# Patient Record
Sex: Female | Born: 1965 | Race: White | Hispanic: No | Marital: Married | Smoking: Never smoker
Health system: Southern US, Community
[De-identification: ages and names within clinical notes are randomized; demographics above are authoritative.]

## PROBLEM LIST (undated history)

## (undated) DIAGNOSIS — T7840XA Allergy, unspecified, initial encounter: Secondary | ICD-10-CM

## (undated) DIAGNOSIS — R1011 Right upper quadrant pain: Secondary | ICD-10-CM

## (undated) DIAGNOSIS — H269 Unspecified cataract: Secondary | ICD-10-CM

## (undated) DIAGNOSIS — J45909 Unspecified asthma, uncomplicated: Secondary | ICD-10-CM

## (undated) DIAGNOSIS — J301 Allergic rhinitis due to pollen: Secondary | ICD-10-CM

## (undated) DIAGNOSIS — M5432 Sciatica, left side: Secondary | ICD-10-CM

## (undated) DIAGNOSIS — N6320 Unspecified lump in the left breast, unspecified quadrant: Secondary | ICD-10-CM

## (undated) DIAGNOSIS — B001 Herpesviral vesicular dermatitis: Secondary | ICD-10-CM

## (undated) DIAGNOSIS — E663 Overweight: Secondary | ICD-10-CM

## (undated) HISTORY — PX: ABDOMINAL HYSTERECTOMY: SHX81

## (undated) HISTORY — DX: Overweight: E66.3

## (undated) HISTORY — DX: Herpesviral vesicular dermatitis: B00.1

## (undated) HISTORY — DX: Right upper quadrant pain: R10.11

## (undated) HISTORY — DX: Allergy, unspecified, initial encounter: T78.40XA

## (undated) HISTORY — DX: Sciatica, left side: M54.32

## (undated) HISTORY — DX: Unspecified lump in the left breast, unspecified quadrant: N63.20

## (undated) HISTORY — DX: Unspecified asthma, uncomplicated: J45.909

## (undated) HISTORY — DX: Unspecified cataract: H26.9

## (undated) HISTORY — DX: Allergic rhinitis due to pollen: J30.1

---

## 1992-11-06 HISTORY — PX: OTHER SURGICAL HISTORY: SHX169

## 2005-11-06 HISTORY — PX: BLADDER SUSPENSION: SHX72

## 2011-12-09 ENCOUNTER — Emergency Department: Payer: Self-pay | Admitting: Emergency Medicine

## 2011-12-09 LAB — COMPREHENSIVE METABOLIC PANEL
Albumin: 3.9 g/dL (ref 3.4–5.0)
Alkaline Phosphatase: 51 U/L (ref 50–136)
Bilirubin,Total: 0.6 mg/dL (ref 0.2–1.0)
Co2: 25 mmol/L (ref 21–32)
Creatinine: 1 mg/dL (ref 0.60–1.30)
EGFR (African American): >60
EGFR (Non-African Amer.): >60
Glucose: 145 mg/dL — ABNORMAL HIGH (ref 65–99)
Osmolality: 280 (ref 275–301)
Sodium: 137 mmol/L (ref 136–145)

## 2011-12-09 LAB — TROPONIN I: Troponin-I: <0.02 ng/mL

## 2011-12-09 LAB — PROTIME-INR
INR: 1
Prothrombin Time: 13.6 secs (ref 11.5–14.7)

## 2012-09-12 ENCOUNTER — Ambulatory Visit: Payer: Self-pay | Admitting: Family Medicine

## 2012-11-06 LAB — HM MAMMOGRAPHY: HM Mammogram: NORMAL

## 2014-09-11 LAB — LIPID PANEL
CHOLESTEROL: 208 mg/dL — AB (ref 0–200)
HDL: 69 mg/dL (ref 35–70)
LDL Cholesterol: 122 mg/dL
Triglycerides: 85 mg/dL (ref 40–160)

## 2015-06-08 ENCOUNTER — Ambulatory Visit (INDEPENDENT_AMBULATORY_CARE_PROVIDER_SITE_OTHER): Payer: BC Managed Care – PPO | Admitting: Family Medicine

## 2015-06-08 ENCOUNTER — Ambulatory Visit: Payer: Self-pay | Admitting: Family Medicine

## 2015-06-08 ENCOUNTER — Encounter: Payer: Self-pay | Admitting: Family Medicine

## 2015-06-08 VITALS — BP 118/84 | HR 111 | Temp 99.0°F | Resp 20 | Ht 63.0 in | Wt 150.4 lb

## 2015-06-08 DIAGNOSIS — R300 Dysuria: Secondary | ICD-10-CM

## 2015-06-08 DIAGNOSIS — R350 Frequency of micturition: Secondary | ICD-10-CM | POA: Diagnosis not present

## 2015-06-08 DIAGNOSIS — N10 Acute tubulo-interstitial nephritis: Secondary | ICD-10-CM

## 2015-06-08 LAB — POCT URINALYSIS DIPSTICK
Bilirubin, UA: NEGATIVE
Glucose, UA: NEGATIVE
KETONES UA: NEGATIVE
Nitrite, UA: NEGATIVE
Protein, UA: 300
SPEC GRAV UA: 1.02
UROBILINOGEN UA: 0.2
pH, UA: 5

## 2015-06-08 MED ORDER — SULFAMETHOXAZOLE-TRIMETHOPRIM 800-160 MG PO TABS: 1.0000 | ORAL_TABLET | Freq: Two times a day (BID) | ORAL | Status: DC

## 2015-06-08 NOTE — Progress Notes (Signed)
Name: Cathy Gray   MRN: [id number]    DOB: 1966-10-17   Date:06/08/2015       Progress Note  Subjective  Chief Complaint  Chief Complaint  Patient presents with  . Urinary Tract Infection    Onset Friday, getting worst, frequency, urgency, burning when urinating and low back pain. Has tried otc Cranberry pills and drinking water with no relief.     Urinary Tract Infection  This is a new problem. The current episode started in the past 7 days. The problem occurs every urination. The problem has been gradually worsening. The quality of the pain is described as burning and stabbing. The pain is moderate. There has been no fever. She is sexually active. There is a history of pyelonephritis. Associated symptoms include chills, flank pain, frequency and urgency. Pertinent negatives include no hematuria. She has tried nothing for the symptoms. The treatment provided no relief. Her past medical history is significant for recurrent UTIs.      Past Medical History  Diagnosis Date  . Allergy   . Asthma     History  Substance Use Topics  . Smoking status: Not on file  . Smokeless tobacco: Never Used  . Alcohol Use: 0.0 oz/week    0 Standard drinks or equivalent per week     Comment: occasionally     Current outpatient prescriptions:  .  ADVAIR DISKUS 250-50 MCG/DOSE AEPB, Inhale 1 puff into the lungs every 12 (twelve) hours as needed., Disp: , Rfl:  .  Pediatric Multiple Vit-C-FA (PEDIATRIC MULTIVITAMIN) chewable tablet, Chew 1 tablet by mouth daily., Disp: , Rfl:   Allergies  Allergen Reactions  . Nitrofurantoin Anaphylaxis  . Penicillin V Potassium Other (See Comments)    Review of Systems  Constitutional: Positive for chills.  Gastrointestinal:       Flank pain  Genitourinary: Positive for dysuria, urgency, frequency and flank pain. Negative for hematuria.     Objective  Filed Vitals:   06/08/15 0912  BP: 118/84  Pulse: 111  Temp: 99 F (37.2 C)  TempSrc: Oral   Resp: 20  Height: 5\' 3"  (1.6 m)  Weight: 150 lb 6.4 oz (68.221 kg)  SpO2: 98%     Physical Exam  Constitutional: She is oriented to person, place, and time and well-developed, well-nourished, and in no distress.  HENT:  Head: Normocephalic.  Eyes: EOM are normal. Pupils are equal, round, and reactive to light.  Neck: Normal range of motion. No thyromegaly present.  Cardiovascular: Normal rate, regular rhythm and normal heart sounds.   No murmur heard. Pulmonary/Chest: Effort normal and breath sounds normal.  Abdominal: Soft. Bowel sounds are normal. There is tenderness.  Left flank pain to percussion  Musculoskeletal: Normal range of motion. She exhibits no edema.  Neurological: She is alert and oriented to person, place, and time. No cranial nerve deficit. Gait normal.  Skin: Skin is warm and dry. No rash noted.  Psychiatric: Memory and affect normal.      Assessment & Plan  1. Acute pyelonephritis Early. Would like to give parenteral abx , allergic to Pcn AVOID QUINOLONES PER CURRENT LITERATURE IF POSSIBLE TO ER IF WORSENED - sulfamethoxazole-trimethoprim (BACTRIM DS,SEPTRA DS) 800-160 MG per tablet; Take 1 tablet by mouth 2 (two) times daily.  Dispense: 17 tablet; Refill: 0  2. Urinary frequency  - POCT Urinalysis Dipstick - Urine Culture  3. Dysuria  - POCT Urinalysis Dipstick - Urine Culture

## 2015-06-08 NOTE — Patient Instructions (Signed)

## 2015-06-10 LAB — URINE CULTURE

## 2015-06-15 ENCOUNTER — Telehealth: Payer: Self-pay | Admitting: Emergency Medicine

## 2015-06-15 MED ORDER — CIPROFLOXACIN HCL 500 MG PO TABS: 500.0000 mg | ORAL_TABLET | Freq: Two times a day (BID) | ORAL | Status: DC

## 2015-06-15 NOTE — Telephone Encounter (Signed)
Spoke to patient about urine culture. Script called into [facility]

## 2015-06-29 ENCOUNTER — Ambulatory Visit: Payer: BC Managed Care – PPO | Admitting: Family Medicine

## 2015-07-28 ENCOUNTER — Telehealth: Payer: Self-pay | Admitting: Family Medicine

## 2015-07-28 NOTE — Telephone Encounter (Signed)
Pt would like to know if you could call her something in for UTI. [facility]. You have nothing available until 1st week of Oct.

## 2015-07-30 ENCOUNTER — Encounter: Payer: Self-pay | Admitting: Family Medicine

## 2015-07-30 ENCOUNTER — Ambulatory Visit (INDEPENDENT_AMBULATORY_CARE_PROVIDER_SITE_OTHER): Payer: BC Managed Care – PPO | Admitting: Family Medicine

## 2015-07-30 VITALS — BP 121/70 | HR 101 | Temp 98.7°F | Resp 14 | Ht 63.0 in | Wt 156.7 lb

## 2015-07-30 DIAGNOSIS — R300 Dysuria: Secondary | ICD-10-CM | POA: Diagnosis not present

## 2015-07-30 DIAGNOSIS — M5430 Sciatica, unspecified side: Secondary | ICD-10-CM | POA: Insufficient documentation

## 2015-07-30 DIAGNOSIS — N390 Urinary tract infection, site not specified: Secondary | ICD-10-CM | POA: Insufficient documentation

## 2015-07-30 DIAGNOSIS — Z23 Encounter for immunization: Secondary | ICD-10-CM

## 2015-07-30 DIAGNOSIS — J45909 Unspecified asthma, uncomplicated: Secondary | ICD-10-CM | POA: Insufficient documentation

## 2015-07-30 DIAGNOSIS — H269 Unspecified cataract: Secondary | ICD-10-CM | POA: Insufficient documentation

## 2015-07-30 LAB — POCT URINALYSIS DIPSTICK
Bilirubin, UA: NEGATIVE
Glucose, UA: NEGATIVE
Ketones, UA: NEGATIVE
NITRITE UA: NEGATIVE
PH UA: 5.5
Spec Grav, UA: 1.015
Urobilinogen, UA: 0.2

## 2015-07-30 MED ORDER — CIPROFLOXACIN HCL 250 MG PO TABS: 250.0000 mg | ORAL_TABLET | Freq: Two times a day (BID) | ORAL | Status: DC

## 2015-07-30 MED ORDER — PHENAZOPYRIDINE HCL 100 MG PO TABS: 100.0000 mg | ORAL_TABLET | Freq: Three times a day (TID) | ORAL | Status: DC | PRN

## 2015-07-30 NOTE — Progress Notes (Signed)
Name: Cathy Gray   MRN: [id number]    DOB: 12/02/1965   Date:07/30/2015       Progress Note  Subjective  Chief Complaint  Chief Complaint  Patient presents with  . Urinary Tract Infection    onset 3 days burning and pressure    HPI  Recurrent UTI / Dysuria : she states she has been having symptoms of UTI about twice monthly lately, usually improves with cranberry pills and juice. She states this episodes has been worse, with severe bladder spasms, urgency, urinary frequency, no hesitancy, no hematuria. No fever or back pain.  She has not taken antibiotics since seen by Dr. Rutherford Nail August 2nd and given Sulfa, never got rx of Cipro  She has a history of sling procedure.    Patient Active Problem List   Diagnosis Date Noted  . Asthma, moderate 07/30/2015  . Bilateral cataracts 07/30/2015  . Neuralgia neuritis, sciatic nerve 07/30/2015  . Recurrent UTI 07/30/2015    Past Surgical History  Procedure Laterality Date  . Abdominal hysterectomy    . Cesarean section    . Renal abscess  1994  . Bladder suspension  2007    Family History  Problem Relation Age of Onset  . Heart disease Father   . Asthma Father   . Diverticulosis Father   . Cancer Sister     Thyroid  . Anxiety disorder Daughter   . OCD Daughter   . Seizures Son     1  . Hyperlipidemia Maternal Grandmother   . Diverticulosis Maternal Grandmother   . Diabetes Maternal Grandfather   . Asthma Paternal Grandmother   . Hyperlipidemia Paternal Grandmother     Social History   Social History  . Marital Status: Married    Spouse Name: N/A  . Number of Children: N/A  . Years of Education: N/A   Occupational History  . Not on file.   Social History Main Topics  . Smoking status: Never Smoker   . Smokeless tobacco: Never Used  . Alcohol Use: No     Comment: occasionally  . Drug Use: No  . Sexual Activity: Yes   Other Topics Concern  . Not on file   Social History Narrative     Current outpatient  prescriptions:  .  ADVAIR DISKUS 250-50 MCG/DOSE AEPB, Inhale 1 puff into the lungs every 12 (twelve) hours as needed., Disp: , Rfl:  .  ciprofloxacin (CIPRO) 250 MG tablet, Take 1 tablet (250 mg total) by mouth 2 (two) times daily., Disp: 10 tablet, Rfl: 0 .  Pediatric Multivit-Minerals-C (GUMMI BEAR MULTIVITAMIN/MIN) CHEW, Chew 2 tablets by mouth daily., Disp: , Rfl:   Allergies  Allergen Reactions  . Nitrofurantoin Anaphylaxis  . Amoxicillin     rash  . Molds & Smuts   . Montelukast Sodium     tachycardia  . Nitrofurantoin Monohyd Macro Diarrhea, Itching and Nausea Only    Other reaction(s): Abdominal pain, Dizziness, Unconsciousness  . Other   . Penicillin V Potassium Other (See Comments)  . Pollen Extract      ROS  Constitutional: Negative for fever or weight change.  Respiratory: Negative for cough and shortness of breath.   Cardiovascular: Negative for chest pain or palpitations.  Gastrointestinal: positive for  abdominal pain, no bowel changes.  Musculoskeletal: Negative for gait problem or joint swelling.  Skin: Negative for rash.  Neurological: Negative for dizziness or headache.  No other specific complaints in a complete review of systems (except as listed in  HPI above).  Objective  Filed Vitals:   07/30/15 1319  BP: 121/70  Pulse: 101  Temp: 98.7 F (37.1 C)  TempSrc: Oral  Resp: 14  Height: 5\' 3"  (1.6 m)  Weight: 156 lb 11.2 oz (71.079 kg)  SpO2: 97%    Body mass index is 27.77 kg/(m^2).  Physical Exam  Constitutional: Patient appears well-developed and well-nourished. Obese  No distress.  HEENT: head atraumatic, normocephalic, pupils equal and reactive to light, neck supple, throat within normal limits Cardiovascular: Normal rate, regular rhythm and normal heart sounds.  No murmur heard. No BLE edema. Pulmonary/Chest: Effort normal and breath sounds normal. No respiratory distress. Abdominal: Soft.  Mild supra pubic discomfort.  Psychiatric:  Patient has a normal mood and affect. behavior is normal. Judgment and thought content normal.  Recent Results (from the past 2160 hour(s))  Urine Culture     Status: Abnormal   Collection Time: 06/08/15 12:00 AM  Result Value Ref Range   Urine Culture, Routine Final report (A)    Urine Culture result 1 Escherichia coli (A)     Comment: Greater than 100,000 colony forming units per mL   ANTIMICROBIAL SUSCEPTIBILITY Comment     Comment:       ** S = Susceptible; I = Intermediate; R = Resistant **                    P = Positive; N = Negative             MICS are expressed in micrograms per mL    Antibiotic                 RSLT#1    RSLT#2    RSLT#3    RSLT#4 Amoxicillin/Clavulanic Acid    I Ampicillin                     R Cefazolin                      R Cefepime                       S Ceftriaxone                    S Cefuroxime                     S Cephalothin                    R Ciprofloxacin                  S Ertapenem                      S Gentamicin                     S Imipenem                       S Levofloxacin                   S Nitrofurantoin                 R Piperacillin                   R Tetracycline  R Tobramycin                     S Trimethoprim/Sulfa             R   POCT Urinalysis Dipstick     Status: Abnormal   Collection Time: 06/08/15  9:22 AM  Result Value Ref Range   Color, UA dark yellow    Clarity, UA cloudy    Glucose, UA negative    Bilirubin, UA negative    Ketones, UA negative    Spec Grav, UA 1.020    Blood, UA moderate    pH, UA 5.0    Protein, UA 300    Urobilinogen, UA 0.2    Nitrite, UA negative    Leukocytes, UA moderate (2+) (A) Negative  POCT urinalysis dipstick     Status: Abnormal   Collection Time: 07/30/15  1:26 PM  Result Value Ref Range   Color, UA yell    Clarity, UA clear\    Glucose, UA neg    Bilirubin, UA neg    Ketones, UA neg    Spec Grav, UA 1.015    Blood, UA 2 trace    pH, UA 5.5     Protein, UA trace    Urobilinogen, UA 0.2    Nitrite, UA neg    Leukocytes, UA moderate (2+) (A) Negative     PHQ2/9: Depression screen [facility] 2/9 06/08/2015  Decreased Interest 0  Down, Depressed, Hopeless 0  PHQ - 2 Score 0     Fall Risk: Fall Risk  06/08/2015  Falls in the past year? No    Assessment & Plan  1. Burning with urination  We will also send Pyridium to take for next 24 hours for symptoms  - POCT urinalysis dipstick - Urine culture  2. Needs flu shot  - Flu Vaccine QUAD 36+ mos PF IM (Fluarix & Fluzone Quad PF)  3. Recurrent UTI  - Urine culture - Ambulatory referral to Urology - ciprofloxacin (CIPRO) 250 MG tablet; Take 1 tablet (250 mg total) by mouth 2 (two) times daily.  Dispense: 10 tablet; Refill: 0

## 2015-08-01 LAB — URINE CULTURE

## 2015-08-09 ENCOUNTER — Ambulatory Visit (INDEPENDENT_AMBULATORY_CARE_PROVIDER_SITE_OTHER): Payer: BC Managed Care – PPO | Admitting: Obstetrics and Gynecology

## 2015-08-09 ENCOUNTER — Encounter: Payer: Self-pay | Admitting: Obstetrics and Gynecology

## 2015-08-09 VITALS — BP 119/73 | HR 77 | Resp 16 | Ht 63.0 in | Wt 156.1 lb

## 2015-08-09 DIAGNOSIS — R109 Unspecified abdominal pain: Secondary | ICD-10-CM

## 2015-08-09 DIAGNOSIS — N390 Urinary tract infection, site not specified: Secondary | ICD-10-CM | POA: Diagnosis not present

## 2015-08-09 LAB — MICROSCOPIC EXAMINATION
RBC MICROSCOPIC, UA: NONE SEEN /HPF (ref 0–?)
WBC UA: NONE SEEN /HPF (ref 0–?)

## 2015-08-09 LAB — URINALYSIS, COMPLETE
Bilirubin, UA: NEGATIVE
Glucose, UA: NEGATIVE
Ketones, UA: NEGATIVE
Leukocytes, UA: NEGATIVE
NITRITE UA: NEGATIVE
PH UA: 7 (ref 5.0–7.5)
Protein, UA: NEGATIVE
RBC, UA: NEGATIVE
SPEC GRAV UA: 1.015 (ref 1.005–1.030)
Urobilinogen, Ur: 0.2 mg/dL (ref 0.2–1.0)

## 2015-08-09 LAB — BLADDER SCAN AMB NON-IMAGING

## 2015-08-09 NOTE — Progress Notes (Signed)
08/09/2015 9:24 AM   Clabe Seal [birth year] [id number]  Referring provider: Steele Sizer, MD [address] [place], [place] [address]  Chief Complaint  Patient presents with  . Recurrent UTI  . Establish Care    HPI: Patient is a 49 year old female presenting today for recurrent UTIs. Records indicate 2 positive urine cultures within the last 2 months positive for greater than 100,000 Escherichia coli. She most recently completed treatment with Cipro 2 days ago. She denies any urinary symptoms today. When she does have an infection symptoms include dysuria, pressure and urinary frequency. She has noticed some occasional right flank pain that radiates down her right leg. She does have a history of lower back problems. She also states that she has been noticing a vibrating sensation in her lower back and hips recently.  She is status post supracervical hysterectomy and bladder sling placement in 2009. She does report some perimenopausal symptoms including occasional night sweats. No complaints of vaginal dryness or irritation. No history of renal stones. No gross hematuria.  H2O intake 60oz of water daily.  No soda.   PMH: Past Medical History  Diagnosis Date  . Allergy   . Asthma   . Hay fever   . Bilateral cataracts   . Fever blister   . Colicky right upper quadrant pain   . Over weight   . Chronic sciatica of left side     Surgical History: Past Surgical History  Procedure Laterality Date  . Abdominal hysterectomy    . Cesarean section    . Renal abscess  1994  . Bladder suspension  2007    Home Medications:    Medication List       This list is accurate as of: 08/09/15  9:24 AM.  Always use your most recent med list.               ADVAIR DISKUS 250-50 MCG/DOSE Aepb  Generic drug:  Fluticasone-Salmeterol  Inhale 1 puff into the lungs every 12 (twelve) hours as needed.     GUMMI BEAR MULTIVITAMIN/MIN Chew  Chew 2 tablets by mouth daily.         Allergies:  Allergies  Allergen Reactions  . Nitrofurantoin Anaphylaxis  . Amoxicillin     rash  . Molds & Smuts   . Montelukast Sodium     tachycardia  . Nitrofurantoin Monohyd Macro Diarrhea, Itching and Nausea Only    Other reaction(s): Abdominal pain, Dizziness, Unconsciousness  . Other   . Penicillin V Potassium Other (See Comments)  . Pollen Extract     Family History: Family History  Problem Relation Age of Onset  . Heart disease Father   . Asthma Father   . Diverticulosis Father   . Cancer Sister     Thyroid  . Anxiety disorder Daughter   . OCD Daughter   . Seizures Son     1  . Hyperlipidemia Maternal Grandmother   . Diverticulosis Maternal Grandmother   . Diabetes Maternal Grandfather   . Asthma Paternal Grandmother   . Hyperlipidemia Paternal Grandmother     Social History:  reports that she has never smoked. She has never used smokeless tobacco. She reports that she does not drink alcohol or use illicit drugs.  ROS: UROLOGY Frequent Urination?: No Hard to postpone urination?: No Burning/pain with urination?: Yes Get up at night to urinate?: No Leakage of urine?: Yes Urine stream starts and stops?: No Trouble starting stream?: No Do you have to strain to  urinate?: No Blood in urine?: No Urinary tract infection?: Yes Sexually transmitted disease?: No Injury to kidneys or bladder?: No Painful intercourse?: No Weak stream?: No Currently pregnant?: No Vaginal bleeding?: No Last menstrual period?: n/a  Gastrointestinal Nausea?: No Vomiting?: No Indigestion/heartburn?: No Diarrhea?: No Constipation?: No  Constitutional Fever: No Night sweats?: Yes Weight loss?: No Fatigue?: No  Skin Skin rash/lesions?: No Itching?: No  Eyes Blurred vision?: No Double vision?: No  Ears/Nose/Throat Sore throat?: No Sinus problems?: No  Hematologic/Lymphatic Swollen glands?: No Easy bruising?: No  Cardiovascular Leg swelling?: No Chest  pain?: No  Respiratory Cough?: No Shortness of breath?: Yes  Endocrine Excessive thirst?: No  Musculoskeletal Back pain?: Yes Joint pain?: No  Neurological Headaches?: No Dizziness?: No  Psychologic Depression?: No Anxiety?: No  Physical Exam: BP 119/73 mmHg  Pulse 77  Resp 16  Ht 5\' 3"  (1.6 m)  Wt 156 lb 1.6 oz (70.806 kg)  BMI 27.66 kg/m2  Constitutional:  Alert and oriented, No acute distress. HEENT: [place] AT, moist mucus membranes.  Trachea midline, no masses. Cardiovascular: No clubbing, cyanosis, or edema. Respiratory: Normal respiratory effort, no increased work of breathing. GI: Abdomen is soft, mild suprapubic tenderness, nondistended, no abdominal masses GU: mild right CVA tenderness.  No vaginal lesions or discharge. Normal urethral meatus. No urethral discharge, masses or tenderness. Normal anus and perineum.   Vaginal mucosa pink, with good moisture and normal rugae.  No tenderness or inflammation. Vaginal vault with moderate support.  Grade 1 bladder prolapse with Valsalva Minimal urethral hypermobility without demonstrable SUI.   Cervix present and normal in appearance.  No discharge or motion tenderness. No adnexal tenderness or palpable masses.  Skin: No rashes, bruises or suspicious lesions. Lymph: No cervical or inguinal adenopathy. Neurologic: Grossly intact, no focal deficits, moving all 4 extremities. Psychiatric: Normal mood and affect.  Laboratory Data:  Urinalysis  Pertinent Imaging:   Assessment & Plan:  49 year old female with recurrent urinary tract infections.  1. Recurrent UTI- Records indicate 2 positive urine cultures within the last 2 months positive for greater than 100,000 Escherichia coli. She most recently completed treatment with Cipro 2 days ago. She denies any urinary symptoms today. When she does have an infection symptoms include dysuria, pressure and urinary frequency. UTI prevention strategies discussed.  Good perineal  hygiene reviewed. Patient is encouraged to increase daily water intake, start cranberry supplements to prevent invasive colonization along the urinary tract and probiotics, especially lactobacillus to restore normal vaginal flora. - Urinalysis, Complete - BLADDER SCAN AMB NON-IMAGING  2. Flank Pain-  Right flank pain with minimal right CVA tenderness today on exam. We'll obtain a renal ultrasound and KUB for evaluation of possible stones as nidus for recurrent urinary tract infections.  Return for f/u for Renal US and KUB results.  These notes generated with voice recognition software. I apologize for typographical errors.  Herbert Moors, [facility] [address], [place], [place] [address] [phone]

## 2015-08-16 ENCOUNTER — Telehealth: Payer: Self-pay

## 2015-08-16 LAB — CULTURE, URINE COMPREHENSIVE

## 2015-08-16 NOTE — Telephone Encounter (Signed)
LMOM for pt in reference to -ucx.

## 2015-08-16 NOTE — Telephone Encounter (Signed)
-----   Message from Roda Shutters, [place] sent at 08/16/2015  8:45 AM EDT ----- Please notify patient that her urine culture was negative for infection. Thank you

## 2015-08-20 ENCOUNTER — Ambulatory Visit
Admission: RE | Admit: 2015-08-20 | Discharge: 2015-08-20 | Disposition: A | Discharge status: Home / Self Care | Payer: BC Managed Care – PPO | Source: Ambulatory Visit | Attending: Obstetrics and Gynecology | Admitting: Obstetrics and Gynecology

## 2015-08-20 DIAGNOSIS — R109 Unspecified abdominal pain: Secondary | ICD-10-CM | POA: Diagnosis not present

## 2015-08-20 DIAGNOSIS — Z87440 Personal history of urinary (tract) infections: Secondary | ICD-10-CM | POA: Diagnosis not present

## 2015-08-20 DIAGNOSIS — N390 Urinary tract infection, site not specified: Secondary | ICD-10-CM

## 2015-08-26 ENCOUNTER — Encounter: Payer: Self-pay | Admitting: Obstetrics and Gynecology

## 2015-08-26 ENCOUNTER — Ambulatory Visit (INDEPENDENT_AMBULATORY_CARE_PROVIDER_SITE_OTHER): Payer: BC Managed Care – PPO | Admitting: Obstetrics and Gynecology

## 2015-08-26 VITALS — BP 129/81 | HR 76 | Resp 16 | Ht 63.0 in | Wt 158.4 lb

## 2015-08-26 DIAGNOSIS — R109 Unspecified abdominal pain: Secondary | ICD-10-CM | POA: Diagnosis not present

## 2015-08-26 DIAGNOSIS — N390 Urinary tract infection, site not specified: Secondary | ICD-10-CM

## 2015-08-26 LAB — MICROSCOPIC EXAMINATION: Renal Epithel, UA: NONE SEEN /hpf

## 2015-08-26 LAB — URINALYSIS, COMPLETE
Bilirubin, UA: NEGATIVE
Glucose, UA: NEGATIVE
KETONES UA: NEGATIVE
Leukocytes, UA: NEGATIVE
NITRITE UA: NEGATIVE
Protein, UA: NEGATIVE
Specific Gravity, UA: 1.015 (ref 1.005–1.030)
Urobilinogen, Ur: 0.2 mg/dL (ref 0.2–1.0)
pH, UA: 7.5 (ref 5.0–7.5)

## 2015-08-26 NOTE — Addendum Note (Signed)
Addended by: Roda Shutters on: 08/26/2015 09:33 AM   Modules accepted: Orders

## 2015-08-26 NOTE — Progress Notes (Signed)
08/26/2015 9:22 AM   Clabe Seal [birth year] [id number]  Referring provider: Steele Sizer, MD [address] [place], [place] [address]  No chief complaint on file.   HPI: Patient presents today for follow-up to review her renal ultrasound and KUB results.  Has history of recurrent urinary tract infections and flank pain imaging studies were performed to evaluate for possible renal stones as nidus for her recurrent infections and pain. Both renal ultrasound and KUB were negative for stones.  Previous Complaint History: Patient is a 49 year old female presenting today for recurrent UTIs. Records indicate 2 positive urine cultures within the last 2 months positive for greater than 100,000 Escherichia coli. She most recently completed treatment with Cipro 2 days ago. She denies any urinary symptoms today. When she does have an infection symptoms include dysuria, pressure and urinary frequency. She has noticed some occasional right flank pain that radiates down her right leg. She does have a history of lower back problems. She also states that she has been noticing a vibrating sensation in her lower back and hips recently.  She is status post supracervical hysterectomy and bladder sling placement in 2009. She does report some perimenopausal symptoms including occasional night sweats. No complaints of vaginal dryness or irritation. No history of renal stones. No gross hematuria.  H2O intake 60oz of water daily. No soda.    PMH: Past Medical History  Diagnosis Date  . Allergy   . Asthma   . Hay fever   . Bilateral cataracts   . Fever blister   . Colicky right upper quadrant pain   . Over weight   . Chronic sciatica of left side     Surgical History: Past Surgical History  Procedure Laterality Date  . Abdominal hysterectomy    . Cesarean section    . Renal abscess  1994  . Bladder suspension  2007    Home Medications:    Medication List       This list is  accurate as of: 08/26/15  9:22 AM.  Always use your most recent med list.               ADVAIR DISKUS 250-50 MCG/DOSE Aepb  Generic drug:  Fluticasone-Salmeterol  Inhale 1 puff into the lungs every 12 (twelve) hours as needed.     GUMMI BEAR MULTIVITAMIN/MIN Chew  Chew 2 tablets by mouth daily.        Allergies:  Allergies  Allergen Reactions  . Nitrofurantoin Anaphylaxis  . Amoxicillin     rash  . Molds & Smuts   . Montelukast Sodium     tachycardia  . Nitrofurantoin Monohyd Macro Diarrhea, Itching and Nausea Only    Other reaction(s): Abdominal pain, Dizziness, Unconsciousness  . Other   . Penicillin V Potassium Other (See Comments)  . Pollen Extract     Family History: Family History  Problem Relation Age of Onset  . Heart disease Father   . Asthma Father   . Diverticulosis Father   . Cancer Sister     Thyroid  . Anxiety disorder Daughter   . OCD Daughter   . Seizures Son     1  . Hyperlipidemia Maternal Grandmother   . Diverticulosis Maternal Grandmother   . Diabetes Maternal Grandfather   . Asthma Paternal Grandmother   . Hyperlipidemia Paternal Grandmother     Social History:  reports that she has never smoked. She has never used smokeless tobacco. She reports that she does not drink alcohol  or use illicit drugs.  ROS: UROLOGY Frequent Urination?: No Hard to postpone urination?: No Burning/pain with urination?: No Get up at night to urinate?: No Leakage of urine?: No Urine stream starts and stops?: No Trouble starting stream?: No Do you have to strain to urinate?: No Blood in urine?: No Urinary tract infection?: No Sexually transmitted disease?: No Injury to kidneys or bladder?: No Painful intercourse?: No Weak stream?: No Currently pregnant?: No Vaginal bleeding?: No Last menstrual period?: n  Gastrointestinal Nausea?: No Vomiting?: No Indigestion/heartburn?: No Diarrhea?: No Constipation?: No  Constitutional Fever: No Night  sweats?: No Weight loss?: No Fatigue?: No  Skin Skin rash/lesions?: No Itching?: No  Eyes Blurred vision?: No Double vision?: No  Ears/Nose/Throat Sore throat?: No Sinus problems?: No  Hematologic/Lymphatic Swollen glands?: No Easy bruising?: No  Cardiovascular Leg swelling?: No Chest pain?: No  Respiratory Cough?: No Shortness of breath?: No  Endocrine Excessive thirst?: No  Musculoskeletal Back pain?: No Joint pain?: No  Neurological Headaches?: No Dizziness?: No  Psychologic Depression?: No Anxiety?: No  Physical Exam: BP 129/81 mmHg  Pulse 76  Resp 16  Ht 5\' 3"  (1.6 m)  Wt 158 lb 6.4 oz (71.85 kg)  BMI 28.07 kg/m2  Constitutional:  Alert and oriented, No acute distress. HEENT: [place] AT, moist mucus membranes.  Trachea midline, no masses. Cardiovascular: No clubbing, cyanosis, or edema. Respiratory: Normal respiratory effort, no increased work of breathing. Skin: No rashes, bruises or suspicious lesions. Lymph: No cervical or inguinal adenopathy. Neurologic: Grossly intact, no focal deficits, moving all 4 extremities. Psychiatric: Normal mood and affect.  Laboratory Data: No results found for: WBC, HGB, HCT, MCV, PLT  Lab Results  Component Value Date   CREATININE 1.00 12/09/2011    No results found for: PSA  No results found for: TESTOSTERONE  No results found for: HGBA1C  Urinalysis    Component Value Date/Time   GLUCOSEU Negative 08/09/2015 0831   BILIRUBINUR Negative 08/09/2015 0831   BILIRUBINUR neg 07/30/2015 1326   PROTEINUR trace 07/30/2015 1326   UROBILINOGEN 0.2 07/30/2015 1326   NITRITE Negative 08/09/2015 0831   NITRITE neg 07/30/2015 1326   LEUKOCYTESUR Negative 08/09/2015 0831   LEUKOCYTESUR moderate (2+)* 07/30/2015 1326    Pertinent Imaging: CLINICAL DATA: Back pain. Recurrent UTIs.  EXAM: ABDOMEN - 1 VIEW  COMPARISON: None.  FINDINGS: Soft tissue structures are unremarkable. No bowel distention.  Stool noted throughout the colon. No pathologic intra-abdominal calcifications. No acute bony abnormality .  IMPRESSION: No acute abnormality .  CLINICAL DATA: Recurrent urinary tract infections, flank pain EXAM: RENAL / URINARY TRACT ULTRASOUND COMPLETE COMPARISON: 09/12/2012 FINDINGS: Right Kidney: Length: 10.9 cm. Echogenicity within normal limits. No mass or hydronephrosis visualized. Left Kidney:  Length: 12.3 cm. Echogenicity within normal limits. No mass or hydronephrosis visualized. Bladder: Appears normal for degree of bladder distention.  IMPRESSION: Normal renal ultrasound for age.  Assessment & Plan:    1. Recurrent UTIs- Records indicate 2 positive urine cultures within the last 2 months positive for greater than 100,000 Escherichia coli. She most recently completed treatment with Cipro 2 days ago. She denies any urinary symptoms today. When she does have an infection symptoms include dysuria, pressure and urinary frequency. UTI prevention strategies discussed. Good perineal hygiene reviewed. Patient is encouraged to increase daily water intake, start cranberry supplements to prevent invasive colonization along the urinary tract and probiotics, especially lactobacillus to restore normal vaginal flora. - Urinalysis, Complete - BLADDER SCAN AMB NON-IMAGING  2. Flank Pain- Right flank  pain.  KUB and RUS negative.  There are no diagnoses linked to this encounter.  Return in about 6 months (around 02/24/2016).  Herbert Moors, [facility] [address], [place], [place] [address] [phone]

## 2015-08-28 LAB — CULTURE, URINE COMPREHENSIVE

## 2015-08-30 ENCOUNTER — Telehealth: Payer: Self-pay

## 2015-08-30 NOTE — Telephone Encounter (Signed)
-----   Message from Roda Shutters, [place] sent at 08/30/2015 11:57 AM EDT ----- Please notify patient that her previous urinary tract infection has solved. Her urine culture was negative for infection. Thanks

## 2015-08-30 NOTE — Telephone Encounter (Signed)
Spoke with pt in reference to -ucx. Pt voiced understanding.  

## 2015-11-02 ENCOUNTER — Telehealth: Payer: Self-pay | Admitting: Family Medicine

## 2015-11-02 MED ORDER — CIPROFLOXACIN HCL 250 MG PO TABS: 250.0000 mg | ORAL_TABLET | Freq: Two times a day (BID) | ORAL | Status: DC

## 2015-11-02 NOTE — Telephone Encounter (Signed)
done

## 2015-11-02 NOTE — Telephone Encounter (Signed)
Patient stated that she has another bladder infection and would like something sent [facility] to the [facility]. Please call once complete

## 2015-11-03 NOTE — Telephone Encounter (Signed)
Patient notified

## 2015-12-07 ENCOUNTER — Other Ambulatory Visit: Payer: Self-pay | Admitting: Obstetrics and Gynecology

## 2015-12-07 DIAGNOSIS — N6315 Unspecified lump in the right breast, overlapping quadrants: Secondary | ICD-10-CM

## 2015-12-07 DIAGNOSIS — N6310 Unspecified lump in the right breast, unspecified quadrant: Principal | ICD-10-CM

## 2015-12-07 DIAGNOSIS — N6320 Unspecified lump in the left breast, unspecified quadrant: Secondary | ICD-10-CM

## 2015-12-07 HISTORY — DX: Unspecified lump in the left breast, unspecified quadrant: N63.20

## 2015-12-15 ENCOUNTER — Telehealth: Payer: Self-pay | Admitting: Family Medicine

## 2015-12-15 NOTE — Telephone Encounter (Signed)
Pt would like something called in for UTI at [facility].

## 2015-12-16 NOTE — Telephone Encounter (Signed)
Please see if patient can come in

## 2015-12-16 NOTE — Telephone Encounter (Signed)
She has pyelonephritis , she can call Urologist or come in to be seen now.

## 2015-12-17 NOTE — Telephone Encounter (Signed)
Pt was informed and was unable to come in.

## 2016-01-15 ENCOUNTER — Other Ambulatory Visit: Payer: Self-pay | Admitting: Family Medicine

## 2016-02-24 ENCOUNTER — Ambulatory Visit: Payer: BC Managed Care – PPO | Admitting: Urology

## 2016-04-18 ENCOUNTER — Other Ambulatory Visit: Payer: Self-pay | Admitting: Family Medicine

## 2016-04-18 NOTE — Telephone Encounter (Signed)
Patient requesting refill. 

## 2016-04-19 NOTE — Telephone Encounter (Signed)
Patient informed and appointment made for 05-24-16

## 2016-05-24 ENCOUNTER — Ambulatory Visit: Payer: BC Managed Care – PPO | Admitting: Family Medicine

## 2016-06-13 ENCOUNTER — Ambulatory Visit: Payer: BC Managed Care – PPO | Admitting: Family Medicine

## 2016-06-15 ENCOUNTER — Ambulatory Visit (INDEPENDENT_AMBULATORY_CARE_PROVIDER_SITE_OTHER): Payer: BC Managed Care – PPO | Admitting: Family Medicine

## 2016-06-15 ENCOUNTER — Encounter: Payer: Self-pay | Admitting: Family Medicine

## 2016-06-15 VITALS — BP 116/64 | HR 76 | Temp 98.3°F | Resp 18 | Ht 63.0 in | Wt 173.6 lb

## 2016-06-15 DIAGNOSIS — M5431 Sciatica, right side: Secondary | ICD-10-CM | POA: Diagnosis not present

## 2016-06-15 DIAGNOSIS — J45998 Other asthma: Secondary | ICD-10-CM | POA: Diagnosis not present

## 2016-06-15 DIAGNOSIS — J45909 Unspecified asthma, uncomplicated: Secondary | ICD-10-CM

## 2016-06-15 DIAGNOSIS — N390 Urinary tract infection, site not specified: Secondary | ICD-10-CM

## 2016-06-15 DIAGNOSIS — H6122 Impacted cerumen, left ear: Secondary | ICD-10-CM

## 2016-06-15 MED ORDER — CRANBERRY 300 MG PO TABS: 300.0000 mg | ORAL_TABLET | Freq: Two times a day (BID) | ORAL | 0 refills | Status: DC

## 2016-06-15 MED ORDER — ALBUTEROL SULFATE HFA 108 (90 BASE) MCG/ACT IN AERS: 2.0000 | INHALATION_SPRAY | Freq: Four times a day (QID) | RESPIRATORY_TRACT | 0 refills | Status: DC | PRN

## 2016-06-15 MED ORDER — FLUTICASONE FUROATE-VILANTEROL 100-25 MCG/INH IN AEPB: 1.0000 | INHALATION_SPRAY | Freq: Every day | RESPIRATORY_TRACT | 0 refills | Status: DC

## 2016-06-15 NOTE — Progress Notes (Signed)
Name: Cathy Gray   MRN: [id number]    DOB: 1965/12/29   Date:06/15/2016       Progress Note  Subjective  Chief Complaint  Chief Complaint  Patient presents with  . Medication Refill  . Asthma    Takes Advair daily and controls symptoms.     HPI  Asthma Moderate: she is using Advair but only once a day, she denies wheezing, cough or SOB. No nocturnal symptoms  Recurrent UTI: she has been taking otc Cranberry pills for almost one year, and only had mild UTI symptoms twice, no need for antibiotics. She has been drinking more water and is symptoms free at this time. No dysuria or urinary urgency or frequency  Sciatica: she has intermittent right lower back pain, worse with extension, using TENS unit and takes Ibuprofen prn a few times weekly, she has not tried massage therapy or chiropractor care. She had PT in the past and it improved symptoms.   Left otalgia: she states her left ear feels like there is something stuck in there, she has been trying to remove with Qtips, and paper clips. Advised to not put anything on ear canal. No fever, nasal congestion or drainage from ear  Patient Active Problem List   Diagnosis Date Noted  . Asthma, moderate 07/30/2015  . Bilateral cataracts 07/30/2015  . Neuralgia neuritis, sciatic nerve 07/30/2015  . Recurrent UTI 07/30/2015    Past Surgical History:  Procedure Laterality Date  . ABDOMINAL HYSTERECTOMY    . BLADDER SUSPENSION  2007  . CESAREAN SECTION    . renal abscess  1994    Family History  Problem Relation Age of Onset  . Heart disease Father   . Asthma Father   . Diverticulosis Father   . Cancer Sister     Thyroid  . Anxiety disorder Daughter   . OCD Daughter   . Seizures Son     1  . Hyperlipidemia Maternal Grandmother   . Diverticulosis Maternal Grandmother   . Diabetes Maternal Grandfather   . Asthma Paternal Grandmother   . Hyperlipidemia Paternal Grandmother     Social History   Social History  . Marital  status: Married    Spouse name: N/A  . Number of children: N/A  . Years of education: N/A   Occupational History  . Not on file.   Social History Main Topics  . Smoking status: Never Smoker  . Smokeless tobacco: Never Used  . Alcohol use No     Comment: occasionally  . Drug use: No  . Sexual activity: Yes    Partners: Male   Other Topics Concern  . Not on file   Social History Narrative  . No narrative on file     Current Outpatient Prescriptions:  .  Pediatric Multivit-Minerals-C (GUMMI BEAR MULTIVITAMIN/MIN) CHEW, Chew 2 tablets by mouth daily., Disp: , Rfl:  .  albuterol (PROVENTIL HFA;VENTOLIN HFA) 108 (90 Base) MCG/ACT inhaler, Inhale 2 puffs into the lungs every 6 (six) hours as needed for wheezing or shortness of breath., Disp: 1 Inhaler, Rfl: 0 .  Cranberry 300 MG tablet, Take 1 tablet (300 mg total) by mouth 2 (two) times daily., Disp: 60 tablet, Rfl: 0 .  fluticasone furoate-vilanterol (BREO ELLIPTA) 100-25 MCG/INH AEPB, Inhale 1 puff into the lungs daily., Disp: 60 each, Rfl: 0  Allergies  Allergen Reactions  . Nitrofurantoin Anaphylaxis  . Amoxicillin     rash  . Molds & Smuts   . Montelukast Sodium  tachycardia  . Nitrofurantoin Monohyd Macro Diarrhea, Itching and Nausea Only    Other reaction(s): Abdominal pain, Dizziness, Unconsciousness  . Penicillin V Potassium Other (See Comments)  . Pollen Extract      ROS  Constitutional: Negative for fever or weight change.  Respiratory: Negative for cough and shortness of breath.   Cardiovascular: Negative for chest pain or palpitations.  Gastrointestinal: Negative for abdominal pain, no bowel changes.  Musculoskeletal: Negative for gait problem or joint swelling.  Skin: Negative for rash.  Neurological: Negative for dizziness or headache.  No other specific complaints in a complete review of systems (except as listed in HPI above).  Objective  Vitals:   06/15/16 1151  BP: 116/64  Pulse: 76   Resp: 18  Temp: 98.3 F (36.8 C)  TempSrc: Oral  SpO2: 99%  Weight: 173 lb 9.6 oz (78.7 kg)  Height: 5\' 3"  (1.6 m)    Body mass index is 30.75 kg/m.  Physical Exam  Constitutional: Patient appears well-developed and well-nourished. Obese  No distress.  HEENT: head atraumatic, normocephalic, pupils equal and reactive to light, neck supple, throat within normal limits, left ear canal has small amount of wax, normal on right side Cardiovascular: Normal rate, regular rhythm and normal heart sounds.  No murmur heard. No BLE edema. Pulmonary/Chest: Effort normal and breath sounds normal. No respiratory distress. Abdominal: Soft.  There is no tenderness. Psychiatric: Patient has a normal mood and affect. behavior is normal. Judgment and thought content normal.   PHQ2/9: Depression screen [facility] 2/9 06/15/2016 06/08/2015  Decreased Interest 0 0  Down, Depressed, Hopeless 0 0  PHQ - 2 Score 0 0    Fall Risk: Fall Risk  06/15/2016 06/08/2015  Falls in the past year? No No    Functional Status Survey: Is the patient deaf or have difficulty hearing?: No Does the patient have difficulty seeing, even when wearing glasses/contacts?: No Does the patient have difficulty concentrating, remembering, or making decisions?: No Does the patient have difficulty walking or climbing stairs?: No Does the patient have difficulty dressing or bathing?: No Does the patient have difficulty doing errands alone such as visiting a doctor's office or shopping?: No    Assessment & Plan  1. Asthma, moderate  We will switch from Advair to [facility] to increase compliance, normal spirometry - fluticasone furoate-vilanterol (BREO ELLIPTA) 100-25 MCG/INH AEPB; Inhale 1 puff into the lungs daily.  Dispense: 60 each; Refill: 0 - albuterol (PROVENTIL HFA;VENTOLIN HFA) 108 (90 Base) MCG/ACT inhaler; Inhale 2 puffs into the lungs every 6 (six) hours as needed for wheezing or shortness of breath.  Dispense: 1 Inhaler; Refill:  0 - Spirometry: Peak  2. Recurrent UTI  - Cranberry 300 MG tablet; Take 1 tablet (300 mg total) by mouth 2 (two) times daily.  Dispense: 60 tablet; Refill: 0   3. Neuralgia neuritis, sciatic nerve, right   Advised to try seeing a chiropractor or resume PT   4. Excessive cerumen in ear canal, left  Offered ear lavage, but she prefers to hold off, advised peroxide and warm water ( half and half ) apply 3-4 drops left ear at night and let the was get outside the ear canal by itself.

## 2016-10-04 ENCOUNTER — Ambulatory Visit (INDEPENDENT_AMBULATORY_CARE_PROVIDER_SITE_OTHER): Payer: BC Managed Care – PPO | Admitting: Family Medicine

## 2016-10-04 ENCOUNTER — Encounter: Payer: Self-pay | Admitting: Family Medicine

## 2016-10-04 VITALS — BP 118/78 | HR 89 | Temp 98.7°F | Resp 16 | Ht 63.0 in | Wt 177.2 lb

## 2016-10-04 DIAGNOSIS — Z79899 Other long term (current) drug therapy: Secondary | ICD-10-CM

## 2016-10-04 DIAGNOSIS — G8929 Other chronic pain: Secondary | ICD-10-CM | POA: Diagnosis not present

## 2016-10-04 DIAGNOSIS — M5442 Lumbago with sciatica, left side: Secondary | ICD-10-CM

## 2016-10-04 DIAGNOSIS — Z13220 Encounter for screening for lipoid disorders: Secondary | ICD-10-CM | POA: Diagnosis not present

## 2016-10-04 DIAGNOSIS — Z131 Encounter for screening for diabetes mellitus: Secondary | ICD-10-CM

## 2016-10-04 DIAGNOSIS — M5441 Lumbago with sciatica, right side: Secondary | ICD-10-CM

## 2016-10-04 DIAGNOSIS — M5416 Radiculopathy, lumbar region: Secondary | ICD-10-CM

## 2016-10-04 DIAGNOSIS — Z23 Encounter for immunization: Secondary | ICD-10-CM

## 2016-10-04 LAB — CBC WITH DIFFERENTIAL/PLATELET
BASOS ABS: 61 {cells}/uL (ref 0–200)
BASOS PCT: 1 %
EOS ABS: 61 {cells}/uL (ref 15–500)
Eosinophils Relative: 1 %
HEMATOCRIT: 40.7 % (ref 35.0–45.0)
Hemoglobin: 13.7 g/dL (ref 11.7–15.5)
LYMPHS PCT: 28 %
Lymphs Abs: 1708 cells/uL (ref 850–3900)
MCH: 32.1 pg (ref 27.0–33.0)
MCHC: 33.7 g/dL (ref 32.0–36.0)
MCV: 95.3 fL (ref 80.0–100.0)
MONO ABS: 366 {cells}/uL (ref 200–950)
MONOS PCT: 6 %
MPV: 9.3 fL (ref 7.5–12.5)
NEUTROS PCT: 64 %
Neutro Abs: 3904 cells/uL (ref 1500–7800)
PLATELETS: 320 10*3/uL (ref 140–400)
RBC: 4.27 MIL/uL (ref 3.80–5.10)
RDW: 12.6 % (ref 11.0–15.0)
WBC: 6.1 10*3/uL (ref 3.8–10.8)

## 2016-10-04 LAB — COMPLETE METABOLIC PANEL WITH GFR
ALBUMIN: 4.2 g/dL (ref 3.6–5.1)
ALK PHOS: 48 U/L (ref 33–130)
ALT: 12 U/L (ref 6–29)
AST: 14 U/L (ref 10–35)
BUN: 14 mg/dL (ref 7–25)
CALCIUM: 9.5 mg/dL (ref 8.6–10.4)
CHLORIDE: 102 mmol/L (ref 98–110)
CO2: 28 mmol/L (ref 20–31)
Creat: 0.8 mg/dL (ref 0.50–1.05)
GFR, EST NON AFRICAN AMERICAN: 86 mL/min (ref >=60)
GFR, Est African American: >89 mL/min (ref >=60)
Glucose, Bld: 91 mg/dL (ref 65–99)
POTASSIUM: 4.7 mmol/L (ref 3.5–5.3)
SODIUM: 139 mmol/L (ref 135–146)
Total Bilirubin: 0.6 mg/dL (ref 0.2–1.2)
Total Protein: 6.8 g/dL (ref 6.1–8.1)

## 2016-10-04 LAB — LIPID PANEL
CHOL/HDL RATIO: 4 ratio (ref <5.0)
CHOLESTEROL: 249 mg/dL — AB (ref <200)
HDL: 63 mg/dL (ref >50)
LDL Cholesterol: 162 mg/dL — ABNORMAL HIGH (ref <100)
Triglycerides: 121 mg/dL (ref <150)
VLDL: 24 mg/dL (ref <30)

## 2016-10-04 LAB — HEMOGLOBIN A1C
HEMOGLOBIN A1C: 5 % (ref <5.7)
Mean Plasma Glucose: 97 mg/dL

## 2016-10-04 MED ORDER — PREGABALIN 75 MG PO CAPS
75.0000 mg | ORAL_CAPSULE | Freq: Two times a day (BID) | ORAL | 5 refills | Status: DC
Start: 2016-10-04 — End: 2017-05-10

## 2016-10-04 NOTE — Progress Notes (Signed)
Name: Cathy Gray   MRN: [id number]    DOB: 03-Feb-1966   Date:10/04/2016       Progress Note  Subjective  Chief Complaint  Chief Complaint  Patient presents with  . Foot Pain    pt thinks it may be a pinched nerve left leg tingles     HPI  Chronic low back pain with radiculitis: patient has a long history of low back pain, but states she had tingling down left buttocks and left toes a couple of weeks ago, it lasted a few days and resolved by itself. She has seen neurosurgeon in the past and could not tolerate gabapentin, we discussed trying Lyrica and she agrees. Discussed possible side effects. No bowel or bladder incontinence. No recent injuries no lower extremity edema  Patient Active Problem List   Diagnosis Date Noted  . Asthma, moderate 07/30/2015  . Bilateral cataracts 07/30/2015  . Neuralgia neuritis, sciatic nerve 07/30/2015  . Recurrent UTI 07/30/2015    Past Surgical History:  Procedure Laterality Date  . ABDOMINAL HYSTERECTOMY    . BLADDER SUSPENSION  2007  . CESAREAN SECTION    . renal abscess  1994    Family History  Problem Relation Age of Onset  . Heart disease Father   . Asthma Father   . Diverticulosis Father   . Cancer Sister     Thyroid  . Anxiety disorder Daughter   . OCD Daughter   . Seizures Son     1  . Hyperlipidemia Maternal Grandmother   . Diverticulosis Maternal Grandmother   . Diabetes Maternal Grandfather   . Asthma Paternal Grandmother   . Hyperlipidemia Paternal Grandmother     Social History   Social History  . Marital status: Married    Spouse name: N/A  . Number of children: N/A  . Years of education: N/A   Occupational History  . Not on file.   Social History Main Topics  . Smoking status: Never Smoker  . Smokeless tobacco: Never Used  . Alcohol use No     Comment: occasionally  . Drug use: No  . Sexual activity: Yes    Partners: Male   Other Topics Concern  . Not on file   Social History Narrative  . No  narrative on file     Current Outpatient Prescriptions:  .  albuterol (PROVENTIL HFA;VENTOLIN HFA) 108 (90 Base) MCG/ACT inhaler, Inhale 2 puffs into the lungs every 6 (six) hours as needed for wheezing or shortness of breath., Disp: 1 Inhaler, Rfl: 0 .  Cranberry 300 MG tablet, Take 1 tablet (300 mg total) by mouth 2 (two) times daily., Disp: 60 tablet, Rfl: 0 .  Pediatric Multivit-Minerals-C (GUMMI BEAR MULTIVITAMIN/MIN) CHEW, Chew 2 tablets by mouth daily., Disp: , Rfl:  .  fluticasone furoate-vilanterol (BREO ELLIPTA) 100-25 MCG/INH AEPB, Inhale 1 puff into the lungs daily. (Patient not taking: Reported on 10/04/2016), Disp: 60 each, Rfl: 0 .  pregabalin (LYRICA) 75 MG capsule, Take 1 capsule (75 mg total) by mouth 2 (two) times daily., Disp: 60 capsule, Rfl: 5  Allergies  Allergen Reactions  . Nitrofurantoin Anaphylaxis  . Amoxicillin     rash  . Gabapentin   . Molds & Smuts   . Montelukast Sodium     tachycardia  . Nitrofurantoin Monohyd Macro Diarrhea, Itching and Nausea Only    Other reaction(s): Abdominal pain, Dizziness, Unconsciousness  . Penicillin V Potassium Other (See Comments)  . Pollen Extract      ROS  Ten systems reviewed and is negative except as mentioned in HPI   Objective  Vitals:   10/04/16 0947  BP: 118/78  Pulse: 89  Resp: 16  Temp: 98.7 F (37.1 C)  TempSrc: Oral  SpO2: 98%  Weight: 177 lb 3 oz (80.4 kg)  Height: 5\' 3"  (1.6 m)    Body mass index is 31.39 kg/m.  Physical Exam  Constitutional: Patient appears well-developed and well-nourished. Obese  No distress.  HEENT: head atraumatic, normocephalic, pupils equal and reactive to light,  neck supple, throat within normal limits Cardiovascular: Normal rate, regular rhythm and normal heart sounds.  No murmur heard. No BLE edema. Pulmonary/Chest: Effort normal and breath sounds normal. No respiratory distress. Abdominal: Soft.  There is no tenderness. Psychiatric: Patient has a normal  mood and affect. behavior is normal. Judgment and thought content normal. Muscular Skeletal: no pain during palpation of lumbar spine, pain with right lateral bending, negative straight leg raise  PHQ2/9: Depression screen [facility] 2/9 10/04/2016 06/15/2016 06/08/2015  Decreased Interest 0 0 0  Down, Depressed, Hopeless 0 0 0  PHQ - 2 Score 0 0 0     Fall Risk: Fall Risk  10/04/2016 06/15/2016 06/08/2015  Falls in the past year? No No No     Functional Status Survey: Is the patient deaf or have difficulty hearing?: No Does the patient have difficulty seeing, even when wearing glasses/contacts?: No Does the patient have difficulty concentrating, remembering, or making decisions?: No Does the patient have difficulty walking or climbing stairs?: No Does the patient have difficulty dressing or bathing?: No Does the patient have difficulty doing errands alone such as visiting a doctor's office or shopping?: No    Assessment & Plan  1. Left lumbar radiculitis  - pregabalin (LYRICA) 75 MG capsule; Take 1 capsule (75 mg total) by mouth 2 (two) times daily.  Dispense: 60 capsule; Refill: 5  2. Needs flu shot  - Flu Vaccine QUAD 36+ mos IM  3. Chronic right-sided low back pain with bilateral sciatica   4. Lipid screening  - Lipid panel  5. Diabetes mellitus screening  - Hemoglobin A1c  6. Long-term use of high-risk medication  - COMPLETE METABOLIC PANEL WITH GFR - CBC with Differential/Platelet  7. Need for pneumococcal vaccination  - Pneumococcal polysaccharide vaccine 23-valent greater than or equal to 2yo subcutaneous/IM

## 2016-10-17 ENCOUNTER — Telehealth: Payer: Self-pay

## 2016-10-17 NOTE — Telephone Encounter (Signed)
Please review labs and send to me so I can inform patient of her labwork.

## 2016-10-17 NOTE — Telephone Encounter (Signed)
It is at the bottom of her labs. It was annotate but sent to patient.

## 2016-11-17 ENCOUNTER — Telehealth: Payer: Self-pay

## 2016-11-17 ENCOUNTER — Ambulatory Visit (INDEPENDENT_AMBULATORY_CARE_PROVIDER_SITE_OTHER): Payer: BC Managed Care – PPO | Admitting: Family Medicine

## 2016-11-17 ENCOUNTER — Encounter: Payer: Self-pay | Admitting: Family Medicine

## 2016-11-17 VITALS — BP 112/70 | HR 106 | Temp 99.0°F | Resp 16 | Ht 63.0 in | Wt 173.5 lb

## 2016-11-17 DIAGNOSIS — J069 Acute upper respiratory infection, unspecified: Secondary | ICD-10-CM | POA: Diagnosis not present

## 2016-11-17 DIAGNOSIS — B9789 Other viral agents as the cause of diseases classified elsewhere: Secondary | ICD-10-CM

## 2016-11-17 DIAGNOSIS — J029 Acute pharyngitis, unspecified: Secondary | ICD-10-CM

## 2016-11-17 LAB — POCT RAPID STREP A (OFFICE): Rapid Strep A Screen: NEGATIVE

## 2016-11-17 MED ORDER — BENZONATATE 200 MG PO CAPS: 200.0000 mg | ORAL_CAPSULE | Freq: Three times a day (TID) | ORAL | 0 refills | Status: DC | PRN

## 2016-11-17 NOTE — Progress Notes (Signed)
Name: Cathy Gray   MRN: [id number]    DOB: Nov 30, 1965   Date:11/17/2016       Progress Note  Subjective  Chief Complaint  Chief Complaint  Patient presents with  . Sore Throat    headache for 1 day    Sore Throat   This is a new problem. The current episode started yesterday. There has been no fever. Associated symptoms include congestion, coughing, headaches and shortness of breath (history of asthma and wheezing started this AM). Pertinent negatives include no ear discharge. She has had no exposure to strep. Exposure to: works in [employer] and comes into daily contact with many children. Treatments tried: Dayquil cold and fever.    Past Medical History:  Diagnosis Date  . Allergy   . Asthma   . Bilateral cataracts   . Breast mass, left 12/07/2015  . Chronic sciatica of left side   . Colicky right upper quadrant pain   . Fever blister   . Hay fever   . Over weight     Past Surgical History:  Procedure Laterality Date  . ABDOMINAL HYSTERECTOMY    . BLADDER SUSPENSION  2007  . CESAREAN SECTION    . renal abscess  1994    Family History  Problem Relation Age of Onset  . Heart disease Father   . Asthma Father   . Diverticulosis Father   . Cancer Sister     Thyroid  . Anxiety disorder Daughter   . OCD Daughter   . Seizures Son     1  . Hyperlipidemia Maternal Grandmother   . Diverticulosis Maternal Grandmother   . Diabetes Maternal Grandfather   . Asthma Paternal Grandmother   . Hyperlipidemia Paternal Grandmother     Social History   Social History  . Marital status: Married    Spouse name: N/A  . Number of children: N/A  . Years of education: N/A   Occupational History  . Not on file.   Social History Main Topics  . Smoking status: Never Smoker  . Smokeless tobacco: Never Used  . Alcohol use No     Comment: occasionally  . Drug use: No  . Sexual activity: Yes    Partners: Male   Other Topics Concern  . Not on file   Social History  Narrative  . No narrative on file     Current Outpatient Prescriptions:  .  albuterol (PROVENTIL HFA;VENTOLIN HFA) 108 (90 Base) MCG/ACT inhaler, Inhale 2 puffs into the lungs every 6 (six) hours as needed for wheezing or shortness of breath., Disp: 1 Inhaler, Rfl: 0 .  Cranberry 300 MG tablet, Take 1 tablet (300 mg total) by mouth 2 (two) times daily., Disp: 60 tablet, Rfl: 0 .  fluticasone furoate-vilanterol (BREO ELLIPTA) 100-25 MCG/INH AEPB, Inhale 1 puff into the lungs daily., Disp: 60 each, Rfl: 0 .  Pediatric Multivit-Minerals-C (GUMMI BEAR MULTIVITAMIN/MIN) CHEW, Chew 2 tablets by mouth daily., Disp: , Rfl:  .  pregabalin (LYRICA) 75 MG capsule, Take 1 capsule (75 mg total) by mouth 2 (two) times daily., Disp: 60 capsule, Rfl: 5 .  ADVAIR DISKUS 250-50 MCG/DOSE AEPB, , Disp: , Rfl:   Allergies  Allergen Reactions  . Nitrofurantoin Anaphylaxis  . Amoxicillin     rash  . Gabapentin   . Molds & Smuts   . Montelukast Sodium     tachycardia  . Nitrofurantoin Monohyd Macro Diarrhea, Itching and Nausea Only    Other reaction(s): Abdominal pain, Dizziness,  Unconsciousness  . Penicillin V Potassium Other (See Comments)  . Pollen Extract      Review of Systems  HENT: Positive for congestion. Negative for ear discharge.   Respiratory: Positive for cough and shortness of breath (history of asthma and wheezing started this AM).   Neurological: Positive for headaches.     Objective  Vitals:   11/17/16 0944  BP: 112/70  Pulse: (!) 106  Resp: 16  Temp: 99 F (37.2 C)  TempSrc: Oral  SpO2: 96%  Weight: 173 lb 8 oz (78.7 kg)  Height: 5\' 3"  (1.6 m)    Physical Exam  Constitutional: She is oriented to person, place, and time and well-developed, well-nourished, and in no distress.  HENT:  Mouth/Throat: No oropharyngeal exudate, posterior oropharyngeal edema or posterior oropharyngeal erythema.  Cerumen impaction left ear  Cardiovascular: Normal rate, regular rhythm and  normal heart sounds.   No murmur heard. Pulmonary/Chest: Effort normal and breath sounds normal. She has no wheezes. She has no rhonchi.  Neurological: She is alert and oriented to person, place, and time.  Psychiatric: Mood, memory, affect and judgment normal.  Nursing note and vitals reviewed.      Assessment & Plan  1. Sore throat Rapid strep test is negative - POCT rapid strep A  2. Viral URI with cough Likely viral upper respiratory infection, no fever, no myalgias solely low likelihood of influenza. Advised to use Tylenol for sore throat and call if not improving within 72 hours. Patient verbalized understanding - benzonatate (TESSALON) 200 MG capsule; Take 1 capsule (200 mg total) by mouth 3 (three) times daily as needed for cough.  Dispense: 20 capsule; Refill: 0   [name] 11/17/2016 9:55 AM

## 2016-11-17 NOTE — Telephone Encounter (Signed)
Patient called states has a really bad sore throat and we don't have any appts today and [facility] walk in is a 2hr wait.  Wants to see if you can call something in?  I told her we usually don't give anything for sore throat but I would ask.  I told her to do salt water gargles, throat lozegens and even could get throat chloraseptic spray.

## 2016-11-17 NOTE — Telephone Encounter (Signed)
Please disregard, there was a cancellation with shah and pt was seen today.

## 2016-11-20 ENCOUNTER — Telehealth: Payer: Self-pay

## 2016-11-20 DIAGNOSIS — J069 Acute upper respiratory infection, unspecified: Secondary | ICD-10-CM

## 2016-11-20 NOTE — Telephone Encounter (Signed)
Patient called stating she seen Dr. Manuella Ghazi last week and was told if she was not feeling any better to call back. Patient was prescribed Benzonatate by Dr. Manuella Ghazi and taking NyQuil Daytime 1 capsule every 4 hours and gargling with salt water. Onset Thursday 11/16/2016, symptoms sore throat, tinnitus, scratchy throat and body aches. Patient was told if her symptoms were any better to call us back and she states they are worst. Please advise.  [phone]

## 2016-11-20 NOTE — Telephone Encounter (Signed)
Patient called stating she seen Dr. Manuella Ghazi last week and was told if she was not feeling any better to call back. Patient was prescribed Benzonatate by Dr. Manuella Ghazi and taking NyQuil Daytime 1 capsule every 4 hours and gargling with salt water. Onset Thursday 11/16/2016, symptoms sore throat, tinnitus, scratchy throat and body aches. Patient was told if her symptoms were any better to call us back and she states they are worst. Please advise.  [phone]

## 2016-11-22 MED ORDER — AZITHROMYCIN 250 MG PO TABS: ORAL_TABLET | ORAL | 0 refills | Status: DC

## 2016-11-22 NOTE — Telephone Encounter (Signed)
Contacted patient and started on Azithromycin, prescription sent to pharmacy

## 2016-11-22 NOTE — Addendum Note (Signed)
Addended byManuella Ghazi, Tameya Kuznia A A on: 11/22/2016 09:52 AM   Modules accepted: Orders

## 2016-12-08 ENCOUNTER — Encounter: Payer: BC Managed Care – PPO | Admitting: Family Medicine

## 2016-12-18 ENCOUNTER — Ambulatory Visit: Payer: BC Managed Care – PPO | Admitting: Family Medicine

## 2016-12-20 ENCOUNTER — Encounter: Payer: BC Managed Care – PPO | Admitting: Family Medicine

## 2017-01-01 LAB — HM MAMMOGRAPHY: HM Mammogram: NORMAL (ref 0–4)

## 2017-03-10 ENCOUNTER — Other Ambulatory Visit: Payer: Self-pay | Admitting: Family Medicine

## 2017-03-12 ENCOUNTER — Other Ambulatory Visit: Payer: Self-pay

## 2017-03-12 ENCOUNTER — Telehealth: Payer: Self-pay | Admitting: Family Medicine

## 2017-03-12 ENCOUNTER — Other Ambulatory Visit: Payer: Self-pay | Admitting: Family Medicine

## 2017-03-12 DIAGNOSIS — J4541 Moderate persistent asthma with (acute) exacerbation: Secondary | ICD-10-CM

## 2017-03-12 MED ORDER — ALBUTEROL SULFATE HFA 108 (90 BASE) MCG/ACT IN AERS: 2.0000 | INHALATION_SPRAY | Freq: Four times a day (QID) | RESPIRATORY_TRACT | 0 refills | Status: DC | PRN

## 2017-03-12 NOTE — Telephone Encounter (Signed)
Patient requesting refill of Advair to [facility].

## 2017-03-12 NOTE — Telephone Encounter (Signed)
Patient requesting refill of Proair to [facility].

## 2017-03-12 NOTE — Telephone Encounter (Signed)
errenous °

## 2017-03-12 NOTE — Telephone Encounter (Signed)
Pt states she is taking advair 250-50 and albuterol

## 2017-05-10 ENCOUNTER — Ambulatory Visit (INDEPENDENT_AMBULATORY_CARE_PROVIDER_SITE_OTHER): Payer: BC Managed Care – PPO | Admitting: Family Medicine

## 2017-05-10 ENCOUNTER — Encounter: Payer: Self-pay | Admitting: Family Medicine

## 2017-05-10 VITALS — BP 110/68 | HR 89 | Temp 98.2°F | Resp 18 | Ht 63.0 in | Wt 170.6 lb

## 2017-05-10 DIAGNOSIS — J4540 Moderate persistent asthma, uncomplicated: Secondary | ICD-10-CM

## 2017-05-10 DIAGNOSIS — E669 Obesity, unspecified: Secondary | ICD-10-CM | POA: Diagnosis not present

## 2017-05-10 DIAGNOSIS — N390 Urinary tract infection, site not specified: Secondary | ICD-10-CM | POA: Diagnosis not present

## 2017-05-10 DIAGNOSIS — M545 Low back pain, unspecified: Secondary | ICD-10-CM

## 2017-05-10 MED ORDER — CYCLOBENZAPRINE HCL 5 MG PO TABS: 5.0000 mg | ORAL_TABLET | Freq: Three times a day (TID) | ORAL | 0 refills | Status: DC | PRN

## 2017-05-10 MED ORDER — FLUTICASONE-SALMETEROL 250-50 MCG/DOSE IN AEPB: 1.0000 | INHALATION_SPRAY | Freq: Two times a day (BID) | RESPIRATORY_TRACT | 5 refills | Status: DC

## 2017-05-10 MED ORDER — MELOXICAM 15 MG PO TABS: 15.0000 mg | ORAL_TABLET | Freq: Every day | ORAL | 2 refills | Status: DC

## 2017-05-10 NOTE — Patient Instructions (Signed)
Low Back Sprain Rehab  Ask your health care provider which exercises are safe for you. Do exercises exactly as told by your health care provider and adjust them as directed. It is normal to feel mild stretching, pulling, tightness, or discomfort as you do these exercises, but you should stop right away if you feel sudden pain or your pain gets worse. Do not begin these exercises until told by your health care provider.  Stretching and range of motion exercises  These exercises warm up your muscles and joints and improve the movement and flexibility of your back. These exercises also help to relieve pain, numbness, and tingling.  Exercise A: Lumbar rotation    1. Lie on your back on a firm surface and bend your knees.  2. Straighten your arms out to your sides so each arm forms an "L" shape with a side of your body (a 90 degree angle).  3. Slowly move both of your knees to one side of your body until you feel a stretch in your lower back. Try not to let your shoulders move off of the floor.  4. Hold for __________ seconds.  5. Tense your abdominal muscles and slowly move your knees back to the starting position.  6. Repeat this exercise on the other side of your body.  Repeat __________ times. Complete this exercise __________ times a day.  Exercise B: Prone extension on elbows    1. Lie on your abdomen on a firm surface.  2. Prop yourself up on your elbows.  3. Use your arms to help lift your chest up until you feel a gentle stretch in your abdomen and your lower back.  ? This will place some of your body weight on your elbows. If this is uncomfortable, try stacking pillows under your chest.  ? Your hips should stay down, against the surface that you are lying on. Keep your hip and back muscles relaxed.  4. Hold for __________ seconds.  5. Slowly relax your upper body and return to the starting position.  Repeat __________ times. Complete this exercise __________ times a day.  Strengthening exercises  These  exercises build strength and endurance in your back. Endurance is the ability to use your muscles for a long time, even after they get tired.  Exercise C: Pelvic tilt  1. Lie on your back on a firm surface. Bend your knees and keep your feet flat.  2. Tense your abdominal muscles. Tip your pelvis up toward the ceiling and flatten your lower back into the floor.  ? To help with this exercise, you may place a small towel under your lower back and try to push your back into the towel.  3. Hold for __________ seconds.  4. Let your muscles relax completely before you repeat this exercise.  Repeat __________ times. Complete this exercise __________ times a day.  Exercise D: Alternating arm and leg raises    1. Get on your hands and knees on a firm surface. If you are on a hard floor, you may want to use padding to cushion your knees, such as an exercise mat.  2. Line up your arms and legs. Your hands should be below your shoulders, and your knees should be below your hips.  3. Lift your left leg behind you. At the same time, raise your right arm and straighten it in front of you.  ? Do not lift your leg higher than your hip.  ? Do not lift your arm   higher than your shoulder.  ? Keep your abdominal and back muscles tight.  ? Keep your hips facing the ground.  ? Do not arch your back.  ? Keep your balance carefully, and do not hold your breath.  4. Hold for __________ seconds.  5. Slowly return to the starting position and repeat with your right leg and your left arm.  Repeat __________ times. Complete this exercise __________ times a day.  Exercise E: Abdominal set with straight leg raise    1. Lie on your back on a firm surface.  2. Bend one of your knees and keep your other leg straight.  3. Tense your abdominal muscles and lift your straight leg up, 4-6 inches (10-15 cm) off the ground.  4. Keep your abdominal muscles tight and hold for __________ seconds.  ? Do not hold your breath.  ? Do not arch your back. Keep it  flat against the ground.  5. Keep your abdominal muscles tense as you slowly lower your leg back to the starting position.  6. Repeat with your other leg.  Repeat __________ times. Complete this exercise __________ times a day.  Posture and body mechanics    Body mechanics refers to the movements and positions of your body while you do your daily activities. Posture is part of body mechanics. Good posture and healthy body mechanics can help to relieve stress in your body's tissues and joints. Good posture means that your spine is in its natural S-curve position (your spine is neutral), your shoulders are pulled back slightly, and your head is not tipped forward. The following are general guidelines for applying improved posture and body mechanics to your everyday activities.  Standing    · When standing, keep your spine neutral and your feet about hip-width apart. Keep a slight bend in your knees. Your ears, shoulders, and hips should line up.  · When you do a task in which you stand in one place for a long time, place one foot up on a stable object that is 2-4 inches (5-10 cm) high, such as a footstool. This helps keep your spine neutral.  Sitting    · When sitting, keep your spine neutral and keep your feet flat on the floor. Use a footrest, if necessary, and keep your thighs parallel to the floor. Avoid rounding your shoulders, and avoid tilting your head forward.  · When working at a desk or a computer, keep your desk at a height where your hands are slightly lower than your elbows. Slide your chair under your desk so you are close enough to maintain good posture.  · When working at a computer, place your monitor at a height where you are looking straight ahead and you do not have to tilt your head forward or downward to look at the screen.  Resting    · When lying down and resting, avoid positions that are most painful for you.  · If you have pain with activities such as sitting, bending, stooping, or squatting  (flexion-based activities), lie in a position in which your body does not bend very much. For example, avoid curling up on your side with your arms and knees near your chest (fetal position).  · If you have pain with activities such as standing for a long time or reaching with your arms (extension-based activities), lie with your spine in a neutral position and bend your knees slightly. Try the following positions:  · Lying on your side with a   pillow between your knees.  · Lying on your back with a pillow under your knees.  Lifting    · When lifting objects, keep your feet at least shoulder-width apart and tighten your abdominal muscles.  · Bend your knees and hips and keep your spine neutral. It is important to lift using the strength of your legs, not your back. Do not lock your knees straight out.  · Always ask for help to lift heavy or awkward objects.  This information is not intended to replace advice given to you by your health care provider. Make sure you discuss any questions you have with your health care provider.  Document Released: 10/23/2005 Document Revised: 06/29/2016 Document Reviewed: 08/04/2015  Elsevier Interactive Patient Education © 2018 [employer]

## 2017-05-10 NOTE — Progress Notes (Signed)
Name: Cathy Gray   MRN: [id number]    DOB: [birth year]   Date:05/10/2017       Progress Note  Subjective  Chief Complaint  Chief Complaint  Patient presents with  . Back Pain    ongoing back issues last Saturday she moved wrong and it has been hurting since    HPI  Chronic low back pain with radiculitis: patient has a long history of low back pain, she is still coaching gymnastics and last week she twisted her back and developed acute pain on right side, pain got progressivly worse radiating from left to right side, but worse on right lower back , with difficulty bearing weight.  She has seen neurosurgeon in the past and could not tolerate gabapentin, she never filled rx of Lyrica. No bowel or bladder incontinence. No recent injuries no lower extremity edema. She denies radiculitis going down her leg. She called a nurse and took a few Flexeril tablets that has helped with symptoms.   Asthma Moderate: she is using Advair but only once a day, she denies wheezing, cough or SOB. No nocturnal symptoms  Recurrent UTI: she stopped taking Cranberry pills lately, but no recent episode of UTI  She has been drinking more water and is symptoms free at this time. No dysuria or urinary urgency or frequency  Obesity: she started a 21 fix diet 17 days ago, she lost 8 lbs, she has was more activity - 30 minute work out and walking daily. She is eating healthy, stopped carbs and junk/sodas.   Patient Active Problem List   Diagnosis Date Noted  . Asthma, moderate 07/30/2015  . Bilateral cataracts 07/30/2015  . Neuralgia neuritis, sciatic nerve 07/30/2015  . Recurrent UTI 07/30/2015    Past Surgical History:  Procedure Laterality Date  . ABDOMINAL HYSTERECTOMY    . BLADDER SUSPENSION  2007  . CESAREAN SECTION    . renal abscess  1994    Family History  Problem Relation Age of Onset  . Heart disease Father   . Asthma Father   . Diverticulosis Father   . Cancer Sister        Thyroid  .  Anxiety disorder Daughter   . OCD Daughter   . Seizures Son        1  . Hyperlipidemia Maternal Grandmother   . Diverticulosis Maternal Grandmother   . Diabetes Maternal Grandfather   . Asthma Paternal Grandmother   . Hyperlipidemia Paternal Grandmother     Social History   Social History  . Marital status: Married    Spouse name: N/A  . Number of children: N/A  . Years of education: N/A   Occupational History  . Not on file.   Social History Main Topics  . Smoking status: Never Smoker  . Smokeless tobacco: Never Used  . Alcohol use No     Comment: occasionally  . Drug use: No  . Sexual activity: Yes    Partners: Male   Other Topics Concern  . Not on file   Social History Narrative  . No narrative on file     Current Outpatient Prescriptions:  .  ADVAIR DISKUS 250-50 MCG/DOSE AEPB, INHALE ONE PUFF BY MOUTH TWICE DAILY, Disp: 180 each, Rfl: 0 .  albuterol (PROVENTIL HFA;VENTOLIN HFA) 108 (90 Base) MCG/ACT inhaler, Inhale 2 puffs into the lungs every 6 (six) hours as needed for wheezing or shortness of breath., Disp: 1 Inhaler, Rfl: 0 .  Cranberry 300 MG tablet, Take 1 tablet (300  mg total) by mouth 2 (two) times daily., Disp: 60 tablet, Rfl: 0 .  cyclobenzaprine (FLEXERIL) 5 MG tablet, Take 5 mg by mouth 3 (three) times daily as needed., Disp: , Rfl: 0 .  Pediatric Multivit-Minerals-C (GUMMI BEAR MULTIVITAMIN/MIN) CHEW, Chew 2 tablets by mouth daily., Disp: , Rfl:  .  pregabalin (LYRICA) 75 MG capsule, Take 1 capsule (75 mg total) by mouth 2 (two) times daily., Disp: 60 capsule, Rfl: 5  Allergies  Allergen Reactions  . Nitrofurantoin Anaphylaxis  . Amoxicillin     rash  . Gabapentin   . Molds & Smuts   . Montelukast Sodium     tachycardia  . Nitrofurantoin Monohyd Macro Diarrhea, Itching and Nausea Only    Other reaction(s): Abdominal pain, Dizziness, Unconsciousness  . Penicillin V Potassium Other (See Comments)  . Pollen Extract       ROS  Constitutional: Negative for fever or significant  weight change.  Respiratory: Negative for cough and shortness of breath.   Cardiovascular: Negative for chest pain or palpitations.  Gastrointestinal: Negative for abdominal pain, no bowel changes.  Musculoskeletal: Positive for gait problem ( secondary to pain )  or joint swelling.  Skin: Negative for rash.  Neurological: Negative for dizziness or headache.  No other specific complaints in a complete review of systems (except as listed in HPI above).  Objective  Vitals:   05/10/17 0830  BP: 110/68  Pulse: 89  Resp: 18  Temp: 98.2 F (36.8 C)  SpO2: 98%  Weight: 170 lb 9 oz (77.4 kg)  Height: 5\' 3"  (1.6 m)    Body mass index is 30.21 kg/m.  Physical Exam  Constitutional: Patient appears well-developed and well-nourished. Obese No distress.  HEENT: head atraumatic, normocephalic, pupils equal and reactive to light, neck supple, throat within normal limits Cardiovascular: Normal rate, regular rhythm and normal heart sounds.  No murmur heard. No BLE edema. Pulmonary/Chest: Effort normal and breath sounds normal. No respiratory distress. Abdominal: Soft.  There is no tenderness. Psychiatric: Patient has a normal mood and affect. behavior is normal. Judgment and thought content normal. Muscular Skeletal: pain during palpation of right lower back, negative straight leg raise  PHQ2/9: Depression screen [facility] 2/9 10/04/2016 06/15/2016 06/08/2015  Decreased Interest 0 0 0  Down, Depressed, Hopeless 0 0 0  PHQ - 2 Score 0 0 0    Fall Risk: Fall Risk  10/04/2016 06/15/2016 06/08/2015  Falls in the past year? No No No     Assessment & Plan  1. Moderate persistent asthma without complication  Doing well on medication  - Fluticasone-Salmeterol (ADVAIR DISKUS) 250-50 MCG/DOSE AEPB; Inhale 1 puff into the lungs 2 (two) times daily.  Dispense: 60 each; Refill: 5  2. Recurrent UTI  Doing well at this time  3.  Intermittent low back pain  - cyclobenzaprine (FLEXERIL) 5 MG tablet; Take 1 tablet (5 mg total) by mouth 3 (three) times daily as needed.  Dispense: 90 tablet; Refill: 0 - meloxicam (MOBIC) 15 MG tablet; Take 1 tablet (15 mg total) by mouth daily.  Dispense: 30 tablet; Refill: 2  4. Obesity (BMI 30.0-34.9)  Discussed with the patient the risk posed by an increased BMI. Discussed importance of portion control, calorie counting and at least 150 minutes of physical activity weekly. Avoid sweet beverages and drink more water. Eat at least 6 servings of fruit and vegetables daily

## 2017-06-08 ENCOUNTER — Ambulatory Visit (INDEPENDENT_AMBULATORY_CARE_PROVIDER_SITE_OTHER): Payer: BC Managed Care – PPO | Admitting: Family Medicine

## 2017-06-08 ENCOUNTER — Encounter: Payer: Self-pay | Admitting: Family Medicine

## 2017-06-08 VITALS — BP 122/76 | HR 79 | Temp 98.1°F | Resp 16 | Ht 63.0 in | Wt 170.2 lb

## 2017-06-08 DIAGNOSIS — Z1211 Encounter for screening for malignant neoplasm of colon: Secondary | ICD-10-CM

## 2017-06-08 DIAGNOSIS — Z0000 Encounter for general adult medical examination without abnormal findings: Secondary | ICD-10-CM

## 2017-06-08 DIAGNOSIS — E785 Hyperlipidemia, unspecified: Secondary | ICD-10-CM | POA: Diagnosis not present

## 2017-06-08 DIAGNOSIS — Z8249 Family history of ischemic heart disease and other diseases of the circulatory system: Secondary | ICD-10-CM

## 2017-06-08 DIAGNOSIS — R202 Paresthesia of skin: Secondary | ICD-10-CM

## 2017-06-08 MED ORDER — ASPIRIN 81 MG PO TABS
81.0000 mg | ORAL_TABLET | Freq: Every day | ORAL | 0 refills | Status: DC
Start: 2017-06-08 — End: 2018-03-11

## 2017-06-08 NOTE — Patient Instructions (Signed)
Preventive Care 40-64 Years, Female Preventive care refers to lifestyle choices and visits with your health care provider that can promote health and wellness. What does preventive care include?  A yearly physical exam. This is also called an annual well check.  Dental exams once or twice a year.  Routine eye exams. Ask your health care provider how often you should have your eyes checked.  Personal lifestyle choices, including: ? Daily care of your teeth and gums. ? Regular physical activity. ? Eating a healthy diet. ? Avoiding tobacco and drug use. ? Limiting alcohol use. ? Practicing safe sex. ? Taking low-dose aspirin daily starting at age 51. ? Taking vitamin and mineral supplements as recommended by your health care provider. What happens during an annual well check? The services and screenings done by your health care provider during your annual well check will depend on your age, overall health, lifestyle risk factors, and family history of disease. Counseling Your health care provider may ask you questions about your:  Alcohol use.  Tobacco use.  Drug use.  Emotional well-being.  Home and relationship well-being.  Sexual activity.  Eating habits.  Work and work Statistician.  Method of birth control.  Menstrual cycle.  Pregnancy history.  Screening You may have the following tests or measurements:  Height, weight, and BMI.  Blood pressure.  Lipid and cholesterol levels. These may be checked every 5 years, or more frequently if you are over 51 years old.  Skin check.  Lung cancer screening. You may have this screening every year starting at age 51 if you have a 30-pack-year history of smoking and currently smoke or have quit within the past 15 years.  Fecal occult blood test (FOBT) of the stool. You may have this test every year starting at age 51.  Flexible sigmoidoscopy or colonoscopy. You may have a sigmoidoscopy every 5 years or a colonoscopy  every 10 years starting at age 51.  Hepatitis C blood test.  Hepatitis B blood test.  Sexually transmitted disease (STD) testing.  Diabetes screening. This is done by checking your blood sugar (glucose) after you have not eaten for a while (fasting). You may have this done every 1-3 years.  Mammogram. This may be done every 1-2 years. Talk to your health care provider about when you should start having regular mammograms. This may depend on whether you have a family history of breast cancer.  BRCA-related cancer screening. This may be done if you have a family history of breast, ovarian, tubal, or peritoneal cancers.  Pelvic exam and Pap test. This may be done every 3 years starting at age 51. Starting at age 51, this may be done every 5 years if you have a Pap test in combination with an HPV test.  Bone density scan. This is done to screen for osteoporosis. You may have this scan if you are at high risk for osteoporosis.  Discuss your test results, treatment options, and if necessary, the need for more tests with your health care provider. Vaccines Your health care provider may recommend certain vaccines, such as:  Influenza vaccine. This is recommended every year.  Tetanus, diphtheria, and acellular pertussis (Tdap, Td) vaccine. You may need a Td booster every 10 years.  Varicella vaccine. You may need this if you have not been vaccinated.  Zoster vaccine. You may need this after age 51.  Measles, mumps, and rubella (MMR) vaccine. You may need at least one dose of MMR if you were born in  1957 or later. You may also need a second dose.  Pneumococcal 13-valent conjugate (PCV13) vaccine. You may need this if you have certain conditions and were not previously vaccinated.  Pneumococcal polysaccharide (PPSV23) vaccine. You may need one or two doses if you smoke cigarettes or if you have certain conditions.  Meningococcal vaccine. You may need this if you have certain  conditions.  Hepatitis A vaccine. You may need this if you have certain conditions or if you travel or work in places where you may be exposed to hepatitis A.  Hepatitis B vaccine. You may need this if you have certain conditions or if you travel or work in places where you may be exposed to hepatitis B.  Haemophilus influenzae type b (Hib) vaccine. You may need this if you have certain conditions.  Talk to your health care provider about which screenings and vaccines you need and how often you need them. This information is not intended to replace advice given to you by your health care provider. Make sure you discuss any questions you have with your health care provider. Document Released: 11/19/2015 Document Revised: 07/12/2016 Document Reviewed: 08/24/2015 Elsevier Interactive Patient Education  2017 [employer].

## 2017-06-08 NOTE — Progress Notes (Signed)
Name: Cathy Gray   MRN: [id number]    DOB: 1966-04-04   Date:06/08/2017       Progress Note  Subjective  Chief Complaint  Chief Complaint  Patient presents with  . Annual Exam    HPI  Well Woman: she has a gyn - [facility] - she had a mammogram 12/2016 had to have a repeat mammogram that was normal, back on yearly follow up. She denies nipple discharge or lumps at this time. She denies vaginal dryness or pain during intercourse. She lost 1 lbs since visit, she is walking more and eating healthier , eating more fruit and vegetables ( 4-5 servings per day) . She has a family history of heart disease, father was in his 35's when started to have problems, lipids last times were over 160. Discussed aspirin and try to take Meloxicam prn only. We will recheck labs. Discussed bone density, but we will hold off for now. Discussed shingles vaccine and she will check coverage before she gets vaccine.  Patient Active Problem List   Diagnosis Date Noted  . Obesity (BMI 30.0-34.9) 05/10/2017  . Asthma, moderate 07/30/2015  . Bilateral cataracts 07/30/2015  . Neuralgia neuritis, sciatic nerve 07/30/2015  . Recurrent UTI 07/30/2015    Past Surgical History:  Procedure Laterality Date  . ABDOMINAL HYSTERECTOMY    . BLADDER SUSPENSION  2007  . CESAREAN SECTION    . renal abscess  1994    Family History  Problem Relation Age of Onset  . Heart disease Father   . Asthma Father   . Diverticulosis Father   . Cancer Sister        Thyroid  . Anxiety disorder Daughter   . OCD Daughter   . Seizures Son        1  . Hyperlipidemia Maternal Grandmother   . Diverticulosis Maternal Grandmother   . Diabetes Maternal Grandfather   . Asthma Paternal Grandmother   . Hyperlipidemia Paternal Grandmother     Social History   Social History  . Marital status: Married    Spouse name: N/A  . Number of children: N/A  . Years of education: N/A   Occupational History  . Not on file.   Social  History Main Topics  . Smoking status: Never Smoker  . Smokeless tobacco: Never Used  . Alcohol use 0.0 oz/week     Comment: occasionally  . Drug use: No  . Sexual activity: Yes    Partners: Male   Other Topics Concern  . Not on file   Social History Narrative  . No narrative on file     Current Outpatient Prescriptions:  .  albuterol (PROVENTIL HFA;VENTOLIN HFA) 108 (90 Base) MCG/ACT inhaler, Inhale 2 puffs into the lungs every 6 (six) hours as needed for wheezing or shortness of breath., Disp: 1 Inhaler, Rfl: 0 .  Cranberry 300 MG tablet, Take 1 tablet (300 mg total) by mouth 2 (two) times daily., Disp: 60 tablet, Rfl: 0 .  cyclobenzaprine (FLEXERIL) 5 MG tablet, Take 1 tablet (5 mg total) by mouth 3 (three) times daily as needed., Disp: 90 tablet, Rfl: 0 .  Fluticasone-Salmeterol (ADVAIR DISKUS) 250-50 MCG/DOSE AEPB, Inhale 1 puff into the lungs 2 (two) times daily., Disp: 60 each, Rfl: 5 .  meloxicam (MOBIC) 15 MG tablet, Take 1 tablet (15 mg total) by mouth daily., Disp: 30 tablet, Rfl: 2 .  Pediatric Multivit-Minerals-C (GUMMI BEAR MULTIVITAMIN/MIN) CHEW, Chew 2 tablets by mouth daily., Disp: , Rfl:  .  aspirin 81 MG tablet, Take 1 tablet (81 mg total) by mouth daily., Disp: 30 tablet, Rfl: 0  Allergies  Allergen Reactions  . Nitrofurantoin Anaphylaxis  . Amoxicillin     rash  . Gabapentin   . Molds & Smuts   . Montelukast Sodium     tachycardia  . Nitrofurantoin Monohyd Macro Diarrhea, Itching and Nausea Only    Other reaction(s): Abdominal pain, Dizziness, Unconsciousness  . Penicillin V Potassium Other (See Comments)  . Pollen Extract      ROS  Constitutional: Negative for fever or weight change.  Respiratory: Negative for cough and shortness of breath.   Cardiovascular: Negative for chest pain or palpitations.  Gastrointestinal: Negative for abdominal pain, no bowel changes.  Musculoskeletal: Negative for gait problem or joint swelling.  Skin: Negative for  rash.  Neurological: Negative for dizziness or headache.  No other specific complaints in a complete review of systems (except as listed in HPI above).  Objective  Vitals:   06/08/17 1351  BP: 122/76  Pulse: 79  Resp: 16  Temp: 98.1 F (36.7 C)  TempSrc: Oral  SpO2: 98%  Weight: 170 lb 3.2 oz (77.2 kg)  Height: 5\' 3"  (1.6 m)    Body mass index is 30.15 kg/m.  Physical Exam  Constitutional: Patient appears well-developed and obese. No distress.  HENT: Head: Normocephalic and atraumatic. Ears: B TMs ok, no erythema or effusion; Nose: Nose normal. Mouth/Throat: Oropharynx is clear and moist. No oropharyngeal exudate.  Eyes: Conjunctivae and EOM are normal. Pupils are equal, round, and reactive to light. No scleral icterus.  Neck: Normal range of motion. Neck supple. No JVD present. No thyromegaly present.  Cardiovascular: Normal rate, regular rhythm and normal heart sounds.  No murmur heard. No BLE edema. Pulmonary/Chest: Effort normal and breath sounds normal. No respiratory distress. Abdominal: Soft. Bowel sounds are normal, no distension. There is no tenderness. no masses Breast: no lumps or masses, no nipple discharge or rashes FEMALE GENITALIA:  Not done RECTAL: not done Musculoskeletal: Normal range of motion, no joint effusions. No gross deformities Neurological: he is alert and oriented to person, place, and time. No cranial nerve deficit. Coordination, balance, strength, speech and gait are normal.  Skin: Skin is warm and dry. No rash noted. No erythema.  Psychiatric: Patient has a normal mood and affect. behavior is normal. Judgment and thought content normal.  PHQ2/9: Depression screen [facility] 2/9 06/08/2017 10/04/2016 06/15/2016 06/08/2015  Decreased Interest 0 0 0 0  Down, Depressed, Hopeless 0 0 0 0  PHQ - 2 Score 0 0 0 0    Fall Risk: Fall Risk  06/08/2017 10/04/2016 06/15/2016 06/08/2015  Falls in the past year? No No No No    Functional Status Survey: Is the  patient deaf or have difficulty hearing?: No Does the patient have difficulty seeing, even when wearing glasses/contacts?: No Does the patient have difficulty concentrating, remembering, or making decisions?: No Does the patient have difficulty walking or climbing stairs?: No Does the patient have difficulty dressing or bathing?: No Does the patient have difficulty doing errands alone such as visiting a doctor's office or shopping?: No   Assessment & Plan  1. Wellness examination  Discussed importance of 150 minutes of physical activity weekly, eat two servings of fish weekly, eat one serving of tree nuts ( cashews, pistachios, pecans, almonds.Marland Kitchen) every other day, eat 6 servings of fruit/vegetables daily and drink plenty of water and avoid sweet beverages.  - COMPLETE METABOLIC PANEL WITH GFR -  Lipid panel  2. Colon cancer screening  - Ambulatory referral to Gastroenterology  3. Dyslipidemia  - Lipid panel  4. Family history of heart disease  - aspirin 81 MG tablet; Take 1 tablet (81 mg total) by mouth daily.  Dispense: 30 tablet; Refill: 0  4. Family history of heart disease  - aspirin 81 MG tablet; Take 1 tablet (81 mg total) by mouth daily.  Dispense: 30 tablet; Refill: 0  5. Paresthesia of foot, bilateral  - Methylmalonic Acid, Serum Burning sensation on bottom of feet , mostly at night, discussed B12, it may be from back, could not tolerate gabapentin in the past, we may need to try Lyrica or Elavil

## 2017-06-12 ENCOUNTER — Encounter: Payer: BC Managed Care – PPO | Admitting: Family Medicine

## 2017-07-27 ENCOUNTER — Telehealth: Payer: Self-pay

## 2017-07-27 ENCOUNTER — Other Ambulatory Visit: Payer: Self-pay

## 2017-07-27 DIAGNOSIS — Z1212 Encounter for screening for malignant neoplasm of rectum: Principal | ICD-10-CM

## 2017-07-27 DIAGNOSIS — Z1211 Encounter for screening for malignant neoplasm of colon: Secondary | ICD-10-CM

## 2017-07-27 MED ORDER — NA SULFATE-K SULFATE-MG SULF 17.5-3.13-1.6 GM/177ML PO SOLN: 1.0000 | Freq: Once | ORAL | 0 refills | Status: AC

## 2017-07-27 NOTE — Telephone Encounter (Signed)
Gastroenterology Pre-Procedure Review  Request Date: 10/8 Requesting Physician: Dr. Vicente Males  PATIENT REVIEW QUESTIONS: The patient responded to the following health history questions as indicated:    1. Are you having any GI issues? no 2. Do you have a personal history of Polyps? no 3. Do you have a family history of Colon Cancer or Polyps? no 4. Diabetes Mellitus? no 5. Joint replacements in the past 12 months?no 6. Major health problems in the past 3 months?no 7. Any artificial heart valves, MVP, or defibrillator?no    MEDICATIONS & ALLERGIES:    Patient reports the following regarding taking any anticoagulation/antiplatelet therapy:   Plavix, Coumadin, Eliquis, Xarelto, Lovenox, Pradaxa, Brilinta, or Effient? no Aspirin? no  Patient confirms/reports the following medications:  Current Outpatient Prescriptions  Medication Sig Dispense Refill  . albuterol (PROVENTIL HFA;VENTOLIN HFA) 108 (90 Base) MCG/ACT inhaler Inhale 2 puffs into the lungs every 6 (six) hours as needed for wheezing or shortness of breath. 1 Inhaler 0  . aspirin 81 MG tablet Take 1 tablet (81 mg total) by mouth daily. 30 tablet 0  . Cranberry 300 MG tablet Take 1 tablet (300 mg total) by mouth 2 (two) times daily. 60 tablet 0  . cyclobenzaprine (FLEXERIL) 5 MG tablet Take 1 tablet (5 mg total) by mouth 3 (three) times daily as needed. 90 tablet 0  . Fluticasone-Salmeterol (ADVAIR DISKUS) 250-50 MCG/DOSE AEPB Inhale 1 puff into the lungs 2 (two) times daily. 60 each 5  . meloxicam (MOBIC) 15 MG tablet Take 1 tablet (15 mg total) by mouth daily. 30 tablet 2  . Pediatric Multivit-Minerals-C (GUMMI BEAR MULTIVITAMIN/MIN) CHEW Chew 2 tablets by mouth daily.     No current facility-administered medications for this visit.     Patient confirms/reports the following allergies:  Allergies  Allergen Reactions  . Nitrofurantoin Anaphylaxis  . Amoxicillin     rash  . Gabapentin   . Molds & Smuts   . Montelukast Sodium      tachycardia  . Nitrofurantoin Monohyd Macro Diarrhea, Itching and Nausea Only    Other reaction(s): Abdominal pain, Dizziness, Unconsciousness  . Penicillin V Potassium Other (See Comments)  . Pollen Extract     No orders of the defined types were placed in this encounter.   AUTHORIZATION INFORMATION Primary Insurance: 1D#: Group #:  Secondary Insurance: 1D#: Group #:  SCHEDULE INFORMATION: Date: 10/8 Time: Location: [place]

## 2017-08-13 ENCOUNTER — Ambulatory Visit
Admission: RE | Admit: 2017-08-13 | Discharge: 2017-08-13 | Disposition: A | Discharge status: Home / Self Care | Payer: BC Managed Care – PPO | Source: Ambulatory Visit | Attending: Gastroenterology | Admitting: Gastroenterology

## 2017-08-13 ENCOUNTER — Ambulatory Visit: Payer: BC Managed Care – PPO | Admitting: Anesthesiology

## 2017-08-13 ENCOUNTER — Encounter
Admission: RE | Disposition: A | Discharge status: Home / Self Care | Payer: Self-pay | Source: Ambulatory Visit | Attending: Gastroenterology

## 2017-08-13 ENCOUNTER — Encounter: Payer: Self-pay | Admitting: *Deleted

## 2017-08-13 DIAGNOSIS — Z1212 Encounter for screening for malignant neoplasm of rectum: Secondary | ICD-10-CM | POA: Diagnosis not present

## 2017-08-13 DIAGNOSIS — Z888 Allergy status to other drugs, medicaments and biological substances status: Secondary | ICD-10-CM | POA: Diagnosis not present

## 2017-08-13 DIAGNOSIS — Z7982 Long term (current) use of aspirin: Secondary | ICD-10-CM | POA: Diagnosis not present

## 2017-08-13 DIAGNOSIS — Z1211 Encounter for screening for malignant neoplasm of colon: Secondary | ICD-10-CM

## 2017-08-13 DIAGNOSIS — Z79899 Other long term (current) drug therapy: Secondary | ICD-10-CM | POA: Diagnosis not present

## 2017-08-13 DIAGNOSIS — Z880 Allergy status to penicillin: Secondary | ICD-10-CM | POA: Insufficient documentation

## 2017-08-13 DIAGNOSIS — D122 Benign neoplasm of ascending colon: Secondary | ICD-10-CM

## 2017-08-13 DIAGNOSIS — J45909 Unspecified asthma, uncomplicated: Secondary | ICD-10-CM | POA: Diagnosis not present

## 2017-08-13 HISTORY — PX: COLONOSCOPY WITH PROPOFOL: SHX5780

## 2017-08-13 SURGERY — COLONOSCOPY WITH PROPOFOL
Anesthesia: General

## 2017-08-13 MED ORDER — LIDOCAINE HCL (CARDIAC) 20 MG/ML IV SOLN
INTRAVENOUS | Status: DC | PRN
  Administered 2017-08-13: 40 mg via INTRAVENOUS

## 2017-08-13 MED ORDER — PROPOFOL 500 MG/50ML IV EMUL
INTRAVENOUS | Status: DC | PRN
  Administered 2017-08-13: 160 ug/kg/min via INTRAVENOUS

## 2017-08-13 MED ORDER — LIDOCAINE HCL (PF) 2 % IJ SOLN
INTRAMUSCULAR | Status: AC
  Filled 2017-08-13: qty 10

## 2017-08-13 MED ORDER — PROPOFOL 10 MG/ML IV BOLUS
INTRAVENOUS | Status: DC | PRN
  Administered 2017-08-13: 60 mg via INTRAVENOUS

## 2017-08-13 MED ORDER — PROPOFOL 500 MG/50ML IV EMUL
INTRAVENOUS | Status: AC
  Filled 2017-08-13: qty 50

## 2017-08-13 MED ORDER — SODIUM CHLORIDE 0.9 % IV SOLN
INTRAVENOUS | Status: DC
  Administered 2017-08-13 (×2): via INTRAVENOUS

## 2017-08-13 NOTE — Op Note (Signed)
[facility] Gastroenterology Patient Name: Cathy Gray Procedure Date: 08/13/2017 8:51 AM MRN: [id number] Account #: [id number] Date of Birth: May 02, 1966 Admit Type: Outpatient Age: 51 Room: [facility] ENDO ROOM 4 Gender: Female Note Status: Finalized Procedure:            Colonoscopy Indications:          Screening for colorectal malignant neoplasm Providers:            Jonathon Bellows MD, MD Referring MD:         Tania Ade (Referring MD) Medicines:            Monitored Anesthesia Care Complications:        No immediate complications. Procedure:            Pre-Anesthesia Assessment:                       - Prior to the procedure, a History and Physical was                        performed, and patient medications, allergies and                        sensitivities were reviewed. The patient's tolerance of                        previous anesthesia was reviewed.                       - The risks and benefits of the procedure and the                        sedation options and risks were discussed with the                        patient. All questions were answered and informed                        consent was obtained.                       - ASA Grade Assessment: III - A patient with severe                        systemic disease.                       After obtaining informed consent, the colonoscope was                        passed under direct vision. Throughout the procedure,                        the patient's blood pressure, pulse, and oxygen                        saturations were monitored continuously. The                        Colonoscope was introduced through the anus and  advanced to the the cecum, identified by the                        appendiceal orifice, ileocecal valve and palpation. The                        colonoscopy was performed with ease. The patient                        tolerated the procedure well. The  quality of the bowel                        preparation was good. Findings:      The perianal and digital rectal examinations were normal.      Two sessile polyps were found in the ascending colon. The polyps were 3       to 4 mm in size. These polyps were removed with a cold biopsy forceps.       Resection and retrieval were complete.      The exam was otherwise without abnormality on direct and retroflexion       views. Impression:           - Two 3 to 4 mm polyps in the ascending colon, removed                        with a cold biopsy forceps. Resected and retrieved.                       - The examination was otherwise normal on direct and                        retroflexion views. Recommendation:       - Discharge patient to home (with escort).                       - Resume previous diet.                       - Continue present medications.                       - Await pathology results.                       - Repeat colonoscopy in 5-10 years for surveillance                        based on pathology results. Procedure Code(s):    --- Professional ---                       [phone], Colonoscopy, flexible; with biopsy, single or                        multiple Diagnosis Code(s):    --- Professional ---                       Z12.11, Encounter for screening for malignant neoplasm                        of colon  D12.2, Benign neoplasm of ascending colon CPT copyright 2016 American Medical Association. All rights reserved. The codes documented in this report are preliminary and upon coder review may  be revised to meet current compliance requirements. Jonathon Bellows, MD Jonathon Bellows MD, MD 08/13/2017 9:27:51 AM This report has been signed electronically. Number of Addenda: 0 Note Initiated On: 08/13/2017 8:51 AM Scope Withdrawal Time: 0 hours 11 minutes 52 seconds  Total Procedure Duration: 0 hours 25 minutes 25 seconds       [facility]

## 2017-08-13 NOTE — Anesthesia Preprocedure Evaluation (Signed)
Anesthesia Evaluation  Patient identified by MRN, date of birth, ID band Patient awake    Reviewed: Allergy & Precautions, NPO status , Patient's Chart, lab work & pertinent test results, reviewed documented beta blocker date and time   Airway Mallampati: II  TM Distance: >3 FB     Dental  (+) Chipped   Pulmonary asthma ,           Cardiovascular      Neuro/Psych  Neuromuscular disease    GI/Hepatic   Endo/Other    Renal/GU      Musculoskeletal   Abdominal   Peds  Hematology   Anesthesia Other Findings   Reproductive/Obstetrics                             Anesthesia Physical Anesthesia Plan  ASA: III  Anesthesia Plan: General   Post-op Pain Management:    Induction: Intravenous  PONV Risk Score and Plan:   Airway Management Planned:   Additional Equipment:   Intra-op Plan:   Post-operative Plan:   Informed Consent: I have reviewed the patients History and Physical, chart, labs and discussed the procedure including the risks, benefits and alternatives for the proposed anesthesia with the patient or authorized representative who has indicated his/her understanding and acceptance.     Plan Discussed with: CRNA  Anesthesia Plan Comments:         Anesthesia Quick Evaluation

## 2017-08-13 NOTE — Anesthesia Postprocedure Evaluation (Signed)
Anesthesia Post Note  Patient: Madalyn Legner  Procedure(s) Performed: COLONOSCOPY WITH PROPOFOL (N/A )  Patient location during evaluation: Endoscopy Anesthesia Type: General Level of consciousness: awake and alert Pain management: pain level controlled Vital Signs Assessment: post-procedure vital signs reviewed and stable Respiratory status: spontaneous breathing, nonlabored ventilation, respiratory function stable and patient connected to nasal cannula oxygen Cardiovascular status: blood pressure returned to baseline and stable Postop Assessment: no apparent nausea or vomiting Anesthetic complications: no     Last Vitals:  Vitals:   08/13/17 0950 08/13/17 1000  BP: 121/88 114/68  Pulse: 76 63  Resp: 15 12  Temp:    SpO2: 98% 100%    Last Pain:  Vitals:   08/13/17 0930  TempSrc: Tympanic                 Lyliana Dicenso S

## 2017-08-13 NOTE — H&P (Signed)
Jonathon Bellows MD [address] [place], [place] [address] Phone: [phone] Fax : [phone]  Primary Care Physician:  Steele Sizer, MD Primary Gastroenterologist:  Dr. Jonathon Bellows   Pre-Procedure History & Physical: HPI:  Cathy Gray is a 51 y.o. female is here for an colonoscopy.   Past Medical History:  Diagnosis Date  . Allergy   . Asthma   . Bilateral cataracts   . Breast mass, left 12/07/2015  . Chronic sciatica of left side   . Colicky right upper quadrant pain   . Fever blister   . Hay fever   . Over weight     Past Surgical History:  Procedure Laterality Date  . ABDOMINAL HYSTERECTOMY    . BLADDER SUSPENSION  2007  . CESAREAN SECTION    . renal abscess  1994    Prior to Admission medications   Medication Sig Start Date End Date Taking? Authorizing Provider  albuterol (PROVENTIL HFA;VENTOLIN HFA) 108 (90 Base) MCG/ACT inhaler Inhale 2 puffs into the lungs every 6 (six) hours as needed for wheezing or shortness of breath. 03/12/17  Yes Steele Sizer, MD  aspirin 81 MG tablet Take 1 tablet (81 mg total) by mouth daily. 06/08/17  Yes Sowles, Drue Stager, MD  Cranberry 300 MG tablet Take 1 tablet (300 mg total) by mouth 2 (two) times daily. 06/15/16  Yes Sowles, Drue Stager, MD  cyclobenzaprine (FLEXERIL) 5 MG tablet Take 1 tablet (5 mg total) by mouth 3 (three) times daily as needed. 05/10/17  Yes Sowles, Drue Stager, MD  Fluticasone-Salmeterol (ADVAIR DISKUS) 250-50 MCG/DOSE AEPB Inhale 1 puff into the lungs 2 (two) times daily. 05/10/17  Yes Sowles, Drue Stager, MD  meloxicam (MOBIC) 15 MG tablet Take 1 tablet (15 mg total) by mouth daily. 05/10/17  Yes Steele Sizer, MD  Pediatric Multivit-Minerals-C (GUMMI BEAR MULTIVITAMIN/MIN) CHEW Chew 2 tablets by mouth daily.   Yes [provider]    Allergies as of 07/27/2017 - Review Complete 06/08/2017  Allergen Reaction Noted  . Nitrofurantoin Anaphylaxis 06/08/2015  . Amoxicillin  06/08/2015  . Gabapentin  10/04/2016    . Molds & smuts  06/08/2015  . Montelukast sodium  06/08/2015  . Nitrofurantoin monohyd macro Diarrhea, Itching, and Nausea Only 06/08/2015  . Penicillin v potassium Other (See Comments) 06/08/2015  . Pollen extract  06/08/2015    Family History  Problem Relation Age of Onset  . Heart disease Father   . Asthma Father   . Diverticulosis Father   . Cancer Sister        Thyroid  . Anxiety disorder Daughter   . OCD Daughter   . Seizures Son        1  . Hyperlipidemia Maternal Grandmother   . Diverticulosis Maternal Grandmother   . Diabetes Maternal Grandfather   . Asthma Paternal Grandmother   . Hyperlipidemia Paternal Grandmother     Social History   Social History  . Marital status: Married    Spouse name: N/A  . Number of children: N/A  . Years of education: N/A   Occupational History  . Not on file.   Social History Main Topics  . Smoking status: Never Smoker  . Smokeless tobacco: Never Used  . Alcohol use 0.0 oz/week     Comment: occasionally  . Drug use: No  . Sexual activity: Yes    Partners: Male   Other Topics Concern  . Not on file   Social History Narrative  . No narrative on file    Review  of Systems: See HPI, otherwise negative ROS  Physical Exam: BP 112/78   Pulse 95   Temp (!) 97 F (36.1 C) (Tympanic)   Resp 18   Ht 5\' 3"  (1.6 m)   Wt 160 lb (72.6 kg)   SpO2 95%   BMI 28.34 kg/m  General:   Alert,  pleasant and cooperative in NAD Head:  Normocephalic and atraumatic. Neck:  Supple; no masses or thyromegaly. Lungs:  Clear throughout to auscultation.    Heart:  Regular rate and rhythm. Abdomen:  Soft, nontender and nondistended. Normal bowel sounds, without guarding, and without rebound.   Neurologic:  Alert and  oriented x4;  grossly normal neurologically.  Impression/Plan: Cathy Gray is here for an colonoscopy to be performed for Screening colonoscopy average risk    Risks, benefits, limitations, and alternatives regarding   colonoscopy have been reviewed with the patient.  Questions have been answered.  All parties agreeable.   Jonathon Bellows, MD  08/13/2017, 8:04 AM

## 2017-08-13 NOTE — Anesthesia Post-op Follow-up Note (Signed)
Anesthesia QCDR form completed.        

## 2017-08-13 NOTE — Transfer of Care (Signed)
Immediate Anesthesia Transfer of Care Note  Patient: Cathy Gray  Procedure(s) Performed: COLONOSCOPY WITH PROPOFOL (N/A )  Patient Location: Endoscopy Unit  Anesthesia Type:General  Level of Consciousness: drowsy and patient cooperative  Airway & Oxygen Therapy: Patient Spontanous Breathing and Patient connected to nasal cannula oxygen  Post-op Assessment: Report given to RN and Post -op Vital signs reviewed and stable  Post vital signs: Reviewed and stable  Last Vitals:  Vitals:   08/13/17 0800  BP: 112/78  Pulse: 95  Resp: 18  Temp: (!) 36.1 C  SpO2: 95%    Last Pain:  Vitals:   08/13/17 0800  TempSrc: Tympanic         Complications: No apparent anesthesia complications

## 2017-08-14 ENCOUNTER — Encounter: Payer: Self-pay | Admitting: Gastroenterology

## 2017-08-14 LAB — SURGICAL PATHOLOGY

## 2017-08-21 ENCOUNTER — Encounter: Payer: Self-pay | Admitting: Gastroenterology

## 2017-08-23 ENCOUNTER — Other Ambulatory Visit: Payer: Self-pay

## 2017-09-10 ENCOUNTER — Encounter: Payer: Self-pay | Admitting: Family Medicine

## 2017-09-10 ENCOUNTER — Ambulatory Visit: Payer: BC Managed Care – PPO | Admitting: Family Medicine

## 2017-09-10 VITALS — BP 130/82 | HR 87 | Resp 12 | Ht 63.0 in | Wt 171.8 lb

## 2017-09-10 DIAGNOSIS — Z9181 History of falling: Secondary | ICD-10-CM

## 2017-09-10 DIAGNOSIS — E785 Hyperlipidemia, unspecified: Secondary | ICD-10-CM | POA: Diagnosis not present

## 2017-09-10 DIAGNOSIS — M545 Low back pain, unspecified: Secondary | ICD-10-CM

## 2017-09-10 DIAGNOSIS — Z131 Encounter for screening for diabetes mellitus: Secondary | ICD-10-CM

## 2017-09-10 DIAGNOSIS — R5383 Other fatigue: Secondary | ICD-10-CM

## 2017-09-10 DIAGNOSIS — Z23 Encounter for immunization: Secondary | ICD-10-CM

## 2017-09-10 DIAGNOSIS — J4540 Moderate persistent asthma, uncomplicated: Secondary | ICD-10-CM | POA: Diagnosis not present

## 2017-09-10 DIAGNOSIS — E669 Obesity, unspecified: Secondary | ICD-10-CM | POA: Diagnosis not present

## 2017-09-10 DIAGNOSIS — E66811 Obesity, class 1: Secondary | ICD-10-CM

## 2017-09-10 NOTE — Progress Notes (Signed)
Name: Cathy Gray   MRN: [id number]    DOB: 08-Feb-1966   Date:09/10/2017       Progress Note  Subjective  Chief Complaint  Chief Complaint  Patient presents with  . 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 She has been trying to stretch at home, she got a new yoga tape  Asthma Moderate: she is using Advair but only once at night, she denies wheezing, cough or SOB. No nocturnal symptoms. She will get flu shot today.   Recurrent UTI: she stopped taking Cranberry pills lately, but no recent episode of UTI  She has been drinking more water and is symptoms free at this time. No dysuria or urinary urgency or frequency. No history of hematuria.   Obesity: She lost some weight during the Summer, however back to work ( behind a desk) and her weight has been stable, not as active, she has been packing lunch but not consistently.    Patient Active Problem List   Diagnosis Date Noted  . Obesity (BMI 30.0-34.9) 05/10/2017  . Asthma, moderate 07/30/2015  . Bilateral cataracts 07/30/2015  . Neuralgia neuritis, sciatic nerve 07/30/2015  . Recurrent UTI 07/30/2015    Past Surgical History:  Procedure Laterality Date  . ABDOMINAL HYSTERECTOMY    . BLADDER SUSPENSION  2007  . CESAREAN SECTION    . renal abscess  1994    Family History  Problem Relation Age of Onset  . Heart disease Father   . Asthma Father   . Diverticulosis Father   . Cancer Sister        Thyroid  . Anxiety disorder Daughter   . OCD Daughter   . Seizures Son        1  . Hyperlipidemia Maternal Grandmother   . Diverticulosis Maternal Grandmother   . Diabetes Maternal Grandfather   . Asthma Paternal Grandmother   . Hyperlipidemia Paternal Grandmother      Social History   Socioeconomic History  . Marital status: Married    Spouse name: Not on file  . Number of children: Not on file  . Years of education: Not on file  . Highest education level: Not on file  Social Needs  . Financial resource strain: Not on file  . Food insecurity - worry: Not on file  . Food insecurity - inability: Not on file  . Transportation needs - medical: Not on file  . Transportation needs - non-medical: Not on file  Occupational History  . Not on file  Tobacco Use  . Smoking status: Never Smoker  . Smokeless tobacco: Never Used  Substance and Sexual Activity  . Alcohol use: Yes    Alcohol/week: 0.0 oz    Comment: occasionally  . Drug use: No  . Sexual activity: Yes    Partners: Male  Other Topics Concern  . Not on file  Social History Narrative  . Not on file     Current Outpatient Medications:  .  albuterol (PROVENTIL HFA;VENTOLIN HFA) 108 (90 Base) MCG/ACT inhaler, Inhale 2 puffs into the lungs every 6 (six) hours as needed for wheezing or shortness of breath., Disp: 1 Inhaler, Rfl: 0 .  aspirin 81 MG tablet, Take 1 tablet (81 mg total) by  mouth daily., Disp: 30 tablet, Rfl: 0 .  Cranberry 300 MG tablet, Take 1 tablet (300 mg total) by mouth 2 (two) times daily., Disp: 60 tablet, Rfl: 0 .  cyclobenzaprine (FLEXERIL) 5 MG tablet, Take 1 tablet (5 mg total) by mouth 3 (three) times daily as needed., Disp: 90 tablet, Rfl: 0 .  Fluticasone-Salmeterol (ADVAIR DISKUS) 250-50 MCG/DOSE AEPB, Inhale 1 puff into the lungs 2 (two) times daily., Disp: 60 each, Rfl: 5 .  meloxicam (MOBIC) 15 MG tablet, Take 1 tablet (15 mg total) by mouth daily., Disp: 30 tablet, Rfl: 2 .  Pediatric Multivit-Minerals-C (GUMMI BEAR MULTIVITAMIN/MIN) CHEW, Chew 2 tablets by mouth daily., Disp: , Rfl:  .  triamcinolone cream (KENALOG) 0.1 %, , Disp: , Rfl:   Allergies  Allergen Reactions  . Nitrofurantoin Anaphylaxis  . Amoxicillin     rash  . Gabapentin   . Molds &  Smuts   . Montelukast Sodium     tachycardia  . Nitrofurantoin Monohyd Macro Diarrhea, Itching and Nausea Only    Other reaction(s): Abdominal pain, Dizziness, Unconsciousness  . Penicillin V Potassium Other (See Comments)  . Pollen Extract      ROS  Constitutional: Negative for fever, positive for  weight change.  Respiratory: Negative for cough and shortness of breath.   Cardiovascular: Negative for chest pain or palpitations.  Gastrointestinal: Negative for abdominal pain, no bowel changes.  Musculoskeletal: Negative for gait problem or joint swelling.  Skin: Negative for rash.  Neurological: Negative for dizziness or headache.  No other specific complaints in a complete review of systems (except as listed in HPI above).  Objective  Vitals:   09/10/17 0814  BP: 130/82  Pulse: 87  Resp: 12  SpO2: 98%  Weight: 171 lb 12.8 oz (77.9 kg)  Height: 5\' 3"  (1.6 m)    Body mass index is 30.43 kg/m.  Physical Exam  Constitutional: Patient appears well-developed and well-nourished. Obese No distress.  HEENT: head atraumatic, normocephalic, pupils equal and reactive to light,neck supple, throat within normal limits Cardiovascular: Normal rate, regular rhythm and normal heart sounds.  No murmur heard. No BLE edema. Pulmonary/Chest: Effort normal and breath sounds normal. No respiratory distress. Abdominal: Soft.  There is no tenderness. Psychiatric: Patient has a normal mood and affect. behavior is normal. Judgment and thought content normal.  Recent Results (from the past 2160 hour(s))  Surgical pathology     Status: None   Collection Time: 08/13/17  9:16 AM  Result Value Ref Range   SURGICAL PATHOLOGY      Surgical Pathology CASE: [id number] PATIENT: [name] Surgical Pathology Report     SPECIMEN SUBMITTED: A. Colon polyp x2, ascending; cbx  CLINICAL HISTORY: None provided  PRE-OPERATIVE DIAGNOSIS: Screening colonoscopy  POST-OPERATIVE  DIAGNOSIS: Ascending colon polyp     DIAGNOSIS: A. COLON POLYP 2, ASCENDING; COLD BIOPSY: - TUBULAR ADENOMA (3 FRAGMENTS). - NEGATIVE FOR HIGH GRADE DYSPLASIA AND MALIGNANCY.    GROSS DESCRIPTION:  A. Labeled: C BX ascending colon polyp 2  Tissue fragment(s): 5  Size: less than 0.1-0.3 cm  Description: pink fragments  Entirely submitted in one cassette(s).    Final Diagnosis performed by Quay Burow, MD.  Electronically signed 08/14/2017 9:06:49AM    The electronic signature indicates that the named Attending Pathologist has evaluated the specimen  Technical component performed at [facility], [address] [place], [place] [address] Lab: [phone] Dir: Darrick Penna. Evette Doffing, MD  Professional compon ent performed at [facility], [address]  [place], [place],  [address] Lab: [phone] Dir: Dellia Nims. Rubinas, MD        PHQ2/9: Depression screen [facility] 2/9 06/08/2017 10/04/2016 06/15/2016 06/08/2015  Decreased Interest 0 0 0 0  Down, Depressed, Hopeless 0 0 0 0  PHQ - 2 Score 0 0 0 0     Fall Risk: Fall Risk  09/10/2017 06/08/2017 10/04/2016 06/15/2016 06/08/2015  Falls in the past year? No No No No No     Assessment & Plan   1. Moderate persistent asthma without complication   2. Need for immunization against influenza  - Flu Vaccine QUAD 36+ mos IM  3. Needs flu shot   4. Dyslipidemia  - Lipid panel  5. Obesity (BMI 30.0-34.9)  Discussed with the patient the risk posed by an increased BMI. Discussed importance of portion control, calorie counting and at least 150 minutes of physical activity weekly. Avoid sweet beverages and drink more water. Eat at least 6 servings of fruit and vegetables daily   6. Intermittent low back pain   7. Diabetes mellitus screening  - Hemoglobin A1c - Insulin, random  8. Other fatigue  - COMPLETE METABOLIC PANEL WITH GFR - CBC with Differential/Platelet - TSH - Vitamin B12 -  VITAMIN D 25 Hydroxy (Vit-D Deficiency, Fractures)   9. 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

## 2017-09-11 ENCOUNTER — Encounter: Payer: Self-pay | Admitting: Family Medicine

## 2017-10-26 ENCOUNTER — Telehealth: Payer: Self-pay | Admitting: Family Medicine

## 2017-10-26 ENCOUNTER — Ambulatory Visit: Payer: BC Managed Care – PPO | Admitting: Family Medicine

## 2017-10-26 NOTE — Telephone Encounter (Signed)
Copied from [place] [id number]. Topic: Quick Communication - Appointment Cancellation >> Oct 26, 2017 10:10 AM Cecelia Byars, NT wrote: Patient called to cancel appointment scheduled for today  at 3: 00 pm Patient {HAS rescheduled their appointment.  Route to department's PEC pool.

## 2017-11-19 ENCOUNTER — Other Ambulatory Visit: Payer: Self-pay

## 2017-11-19 DIAGNOSIS — J4541 Moderate persistent asthma with (acute) exacerbation: Secondary | ICD-10-CM

## 2017-11-19 DIAGNOSIS — J4540 Moderate persistent asthma, uncomplicated: Secondary | ICD-10-CM

## 2017-11-19 MED ORDER — FLUTICASONE-SALMETEROL 250-50 MCG/DOSE IN AEPB: 1.0000 | INHALATION_SPRAY | Freq: Two times a day (BID) | RESPIRATORY_TRACT | 5 refills | Status: DC

## 2017-11-19 NOTE — Telephone Encounter (Signed)
Refill request for general medication.  Advair to [facility].  Last office visit: 09/10/2017   Follow up on 03/11/2018 f

## 2017-12-25 ENCOUNTER — Other Ambulatory Visit: Payer: Self-pay | Admitting: Obstetrics and Gynecology

## 2017-12-25 DIAGNOSIS — Z1231 Encounter for screening mammogram for malignant neoplasm of breast: Secondary | ICD-10-CM

## 2018-01-17 ENCOUNTER — Ambulatory Visit
Admission: RE | Admit: 2018-01-17 | Discharge: 2018-01-17 | Disposition: A | Discharge status: Home / Self Care | Payer: BC Managed Care – PPO | Source: Ambulatory Visit | Attending: Obstetrics and Gynecology | Admitting: Obstetrics and Gynecology

## 2018-01-17 DIAGNOSIS — Z1231 Encounter for screening mammogram for malignant neoplasm of breast: Secondary | ICD-10-CM | POA: Diagnosis not present

## 2018-03-11 ENCOUNTER — Ambulatory Visit: Payer: BC Managed Care – PPO | Admitting: Family Medicine

## 2018-03-11 ENCOUNTER — Encounter: Payer: Self-pay | Admitting: Family Medicine

## 2018-03-11 VITALS — BP 102/60 | HR 73 | Resp 14 | Ht 63.0 in | Wt 172.9 lb

## 2018-03-11 DIAGNOSIS — E785 Hyperlipidemia, unspecified: Secondary | ICD-10-CM

## 2018-03-11 DIAGNOSIS — M545 Low back pain, unspecified: Secondary | ICD-10-CM

## 2018-03-11 DIAGNOSIS — M7702 Medial epicondylitis, left elbow: Secondary | ICD-10-CM

## 2018-03-11 DIAGNOSIS — E669 Obesity, unspecified: Secondary | ICD-10-CM

## 2018-03-11 DIAGNOSIS — J4540 Moderate persistent asthma, uncomplicated: Secondary | ICD-10-CM | POA: Diagnosis not present

## 2018-03-11 DIAGNOSIS — M79672 Pain in left foot: Secondary | ICD-10-CM | POA: Diagnosis not present

## 2018-03-11 DIAGNOSIS — N390 Urinary tract infection, site not specified: Secondary | ICD-10-CM | POA: Diagnosis not present

## 2018-03-11 MED ORDER — MELOXICAM 15 MG PO TABS: 15.0000 mg | ORAL_TABLET | Freq: Every day | ORAL | 2 refills | Status: DC

## 2018-03-11 MED ORDER — WIXELA INHUB 250-50 MCG/DOSE IN AEPB: 1.0000 | INHALATION_SPRAY | Freq: Two times a day (BID) | RESPIRATORY_TRACT | 5 refills | Status: DC

## 2018-03-11 MED ORDER — CYCLOBENZAPRINE HCL 5 MG PO TABS: 5.0000 mg | ORAL_TABLET | Freq: Three times a day (TID) | ORAL | 0 refills | Status: DC | PRN

## 2018-03-11 NOTE — Progress Notes (Signed)
Name: Cathy Gray   MRN: [id number]    DOB: 1966-09-08   Date:03/11/2018       Progress Note  Subjective  Chief Complaint  Chief Complaint  Patient presents with  . Asthma  . Obesity  . 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 She is moving, and picking up boxes and moving around, but no recent episodes of radiculitis  Left elbow pain: she states over the medial aspect of left elbow, tender to touch and with certain movements, no redness or increase in warmth.   Left foot pain: she dropped a pieced of siding on top of left foot two nights ago. She states she noticed swelling on top of her left foot, able to bear weight. She was wearing tennis shoes at the time. No bruising or redness, but is still swollen. She has been icing it and has improved.   Asthma Moderate: she is using generic Advair but only once at night, she denies wheezing, cough or SOB. No nocturnal symptoms. She states asthma not worse with Spring. She states she has not needed a rescue inhaler in the past 2 weeks.   Recurrent UTI: shestopped taking Cranberry pills lately, but no recent episode of UTIShe has been drinking more water and is symptoms free at this time. No dysuria or urinary urgency or frequency. Unchanged   Obesity: Weight has been stable past 6 months, she has been more active - moving now. She is skipping breakfast and eating late at night, she will try to incorporate breakfast, and try to eat earlier night.    Patient Active Problem List   Diagnosis Date Noted  . History of recent fall 09/10/2017  . Obesity (BMI 30.0-34.9) 05/10/2017  . Asthma, moderate 07/30/2015  . Bilateral cataracts 07/30/2015  . Neuralgia neuritis,  sciatic nerve 07/30/2015  . Recurrent UTI 07/30/2015    Past Surgical History:  Procedure Laterality Date  . ABDOMINAL HYSTERECTOMY    . BLADDER SUSPENSION  2007  . CESAREAN SECTION    . COLONOSCOPY WITH PROPOFOL N/A 08/13/2017   Procedure: COLONOSCOPY WITH PROPOFOL;  Surgeon: Jonathon Bellows, MD;  Location: [facility] ENDOSCOPY;  Service: Gastroenterology;  Laterality: N/A;  . renal abscess  1994    Family History  Problem Relation Age of Onset  . Heart disease Father   . Asthma Father   . Diverticulosis Father   . Cancer Sister        Thyroid  . Anxiety disorder Daughter   . OCD Daughter   . Seizures Son        1  . Hyperlipidemia Maternal Grandmother   . Diverticulosis Maternal Grandmother   . Diabetes Maternal Grandfather   . Asthma Paternal Grandmother   . Hyperlipidemia Paternal Grandmother     Social History   Socioeconomic History  . Marital status: Married    Spouse name: Not on file  . Number of children: Not on file  . Years of education: Not on file  . Highest education level: Not on file  Occupational History  . Not on file  Social Needs  . Financial resource strain: Not on file  . Food insecurity:    Worry: Not on file    Inability: Not on file  .  Transportation needs:    Medical: Not on file    Non-medical: Not on file  Tobacco Use  . Smoking status: Never Smoker  . Smokeless tobacco: Never Used  Substance and Sexual Activity  . Alcohol use: Yes    Alcohol/week: 0.0 oz    Comment: occasionally  . Drug use: No  . Sexual activity: Yes    Partners: Male  Lifestyle  . Physical activity:    Days per week: Not on file    Minutes per session: Not on file  . Stress: Not on file  Relationships  . Social connections:    Talks on phone: Not on file    Gets together: Not on file    Attends religious service: Not on file    Active member of club or organization: Not on file    Attends meetings of clubs or organizations: Not on file    Relationship  status: Not on file  . Intimate partner violence:    Fear of current or ex partner: Not on file    Emotionally abused: Not on file    Physically abused: Not on file    Forced sexual activity: Not on file  Other Topics Concern  . Not on file  Social History Narrative  . Not on file     Current Outpatient Medications:  .  albuterol (PROVENTIL HFA;VENTOLIN HFA) 108 (90 Base) MCG/ACT inhaler, Inhale 2 puffs into the lungs every 6 (six) hours as needed for wheezing or shortness of breath., Disp: 1 Inhaler, Rfl: 0 .  cyclobenzaprine (FLEXERIL) 5 MG tablet, Take 1 tablet (5 mg total) by mouth 3 (three) times daily as needed., Disp: 90 tablet, Rfl: 0 .  meloxicam (MOBIC) 15 MG tablet, Take 1 tablet (15 mg total) by mouth daily., Disp: 30 tablet, Rfl: 2 .  Pediatric Multivit-Minerals-C (GUMMI BEAR MULTIVITAMIN/MIN) CHEW, Chew 2 tablets by mouth daily., Disp: , Rfl:  .  triamcinolone cream (KENALOG) 0.1 %, , Disp: , Rfl:  .  WIXELA INHUB 250-50 MCG/DOSE AEPB, Take 1 puff by mouth 2 (two) times daily., Disp: , Rfl: 5  Allergies  Allergen Reactions  . Nitrofurantoin Anaphylaxis  . Amoxicillin     rash  . Gabapentin   . Molds & Smuts   . Montelukast Sodium     tachycardia  . Nitrofurantoin Monohyd Macro Diarrhea, Itching and Nausea Only    Other reaction(s): Abdominal pain, Dizziness, Unconsciousness  . Penicillin V Potassium Other (See Comments)  . Pollen Extract      ROS  Constitutional: Negative for fever or weight change.  Respiratory: Negative for cough and shortness of breath.   Cardiovascular: Negative for chest pain or palpitations.  Gastrointestinal: Negative for abdominal pain, no bowel changes.  Musculoskeletal: Negative for gait problem , positive for joint swelling.  Skin: Negative for rash.  Neurological: Negative for dizziness or headache.  No other specific complaints in a complete review of systems (except as listed in HPI above).  Objective  Vitals:   03/11/18  0748  BP: 102/60  Pulse: 73  Resp: 14  SpO2: 98%  Weight: 172 lb 14.4 oz (78.4 kg)  Height: 5\' 3"  (1.6 m)    Body mass index is 30.63 kg/m.  Physical Exam  Constitutional: Patient appears well-developed and well-nourished. Obese  No distress.  HEENT: head atraumatic, normocephalic, pupils equal and reactive to light, neck supple, throat within normal limits Cardiovascular: Normal rate, regular rhythm and normal heart sounds.  No murmur heard. No BLE edema.  Pulmonary/Chest: Effort normal and breath sounds normal. No respiratory distress. Abdominal: Soft.  There is no tenderness. Psychiatric: Patient has a normal mood and affect. behavior is normal. Judgment and thought content normal. Muscular Skeletal pain and swelling on top of left foot, normal rom, able to bear weight. Left medial epicondyle tender to touch with supination and also with flexion under resistance of left elbow.   PHQ2/9: Depression screen [facility] 2/9 06/08/2017 10/04/2016 06/15/2016 06/08/2015  Decreased Interest 0 0 0 0  Down, Depressed, Hopeless 0 0 0 0  PHQ - 2 Score 0 0 0 0     Fall Risk: Fall Risk  03/11/2018 09/10/2017 06/08/2017 10/04/2016 06/15/2016  Falls in the past year? No No No No No    Functional Status Survey: Is the patient deaf or have difficulty hearing?: No Does the patient have difficulty seeing, even when wearing glasses/contacts?: No Does the patient have difficulty concentrating, remembering, or making decisions?: No Does the patient have difficulty walking or climbing stairs?: No Does the patient have difficulty dressing or bathing?: No Does the patient have difficulty doing errands alone such as visiting a doctor's office or shopping?: No     Assessment & Plan  1. Moderate persistent asthma without complication  - WIXELA INHUB 250-50 MCG/DOSE AEPB; Inhale 1 puff into the lungs 2 (two) times daily.  Dispense: 60 each; Refill: 5  2. Dyslipidemia  Check labs today   3. Obesity (BMI  30.0-34.9)  Discussed with the patient the risk posed by an increased BMI. Discussed importance of portion control, calorie counting and at least 150 minutes of physical activity weekly. Avoid sweet beverages and drink more water. Eat at least 6 servings of fruit and vegetables daily   4. Intermittent low back pain  - cyclobenzaprine (FLEXERIL) 5 MG tablet; Take 1 tablet (5 mg total) by mouth 3 (three) times daily as needed.  Dispense: 90 tablet; Refill: 0 - meloxicam (MOBIC) 15 MG tablet; Take 1 tablet (15 mg total) by mouth daily.  Dispense: 30 tablet; Refill: 2  5. Recurrent UTI   6. Acute foot pain, left  Discussed ice  And rest, but if no improvement by Friday, call back for referral to Ortho/Podiatrist and or x-ray   7. Medial epicondylitis of left elbow  Try home exercising, ice and elbow brace and if no improvement call us back

## 2018-03-11 NOTE — Patient Instructions (Signed)
[facility] Ask your health care provider which exercises are safe for you. Do exercises exactly as told by your health care provider and adjust them as directed. It is normal to feel mild stretching, pulling, tightness, or discomfort as you do these exercises, but you should stop right away if you feel sudden pain or your pain gets worse. Do not begin these exercises until told by your health care provider. Stretching and range of motion exercises These exercises warm up your muscles and joints and improve the movement and flexibility of your elbow. Exercise A: Wrist flexors  1. Straighten your left / right elbow in front of you with your palm up. If told by your health care provider, do this stretch with your elbow bent rather than straight. 2. With your other hand, gently pull your left / right hand and fingers toward you until you feel a gentle stretch on the top of your forearm. 3. Hold this position for __________ seconds. Repeat __________ times. Complete this exercise __________ times a day. Strengthening exercises These exercises build strength and endurance in your elbow. Endurance is the ability to use your muscles for a long time, even after several repetitions. Exercise B: Wrist flexion  1. Sit with your left / right forearm palm-up and supported on a table or other surface. 2. Let your left / right wrist extend over the edge of the surface. 3. Hold a __________ weight or a piece of rubber exercise band or tubing. Slowly curl your hand up toward your forearm. Try to only move your hand and keep the rest of your arm still. 4. Hold this position for __________ seconds. 5. Slowly return to the starting position. Repeat __________ times. Complete this exercise __________ times a day. Exercise C: Eccentric wrist flexion 1. Sit with your left / right forearm palm-up and supported on a table or other surface. 2. Let your left / right wrist extend over the edge of the  surface. 3. Hold a __________ weight or a piece of rubber exercise band or tubing. 4. Use your other hand to move your left / right hand up toward your forearm. 5. Slowly return to the starting position using only your left / right hand. Repeat __________ times. Complete this exercise __________ times a day. Exercise D: Hand turns, pronation i 1. Sit with your left / right forearm supported on a table or other surface. 2. Move your wrist so your pinkie travels toward your forearm and your thumb moves away from your forearm. 3. Hold this position for __________ seconds. 4. Slowly return to the starting position. Exercise E: Hand turns, pronation ii  1. Sit with your left / right forearm supported on a table or other surface. 2. Hold a hammer in your left / right hand. ? The exercise will be easier if you hold on near the head of the hammer. ? If you hold on toward the end of the handle, the exercise will be harder. 3. Rest your left / right hand over the edge of the table with your palm facing up. 4. Without moving your elbow, slowly turn your palm and your hand down toward the table. 5. Hold this position for __________ seconds. 6. Slowly return to the starting position. Repeat __________ times. Complete this exercise __________ times a day. Exercise F: Shoulder blade squeeze 1. Sit in a stable chair with good posture. Do not let your back touch the back of the chair. 2. Your arms should be at your   sides with your elbows bent. You can rest your forearms on a pillow. 3. Squeeze your shoulder blades together. Keep your shoulders level. Do not lift your shoulders up toward your ears. 4. Hold this position for __________ seconds. 5. Slowly release. Repeat __________ times. Complete this exercise __________ times a day. This information is not intended to replace advice given to you by your health care provider. Make sure you discuss any questions you have with your health care  provider. Document Released: 10/23/2005 Document Revised: 06/29/2016 Document Reviewed: 07/05/2015 Elsevier Interactive Patient Education  2018 [employer] Inc[employer]  

## 2018-03-12 LAB — INSULIN, RANDOM: Insulin: 3.6 u[IU]/mL (ref 2.0–19.6)

## 2018-03-12 LAB — COMPLETE METABOLIC PANEL WITH GFR
AG Ratio: 1.7 (calc) (ref 1.0–2.5)
ALKALINE PHOSPHATASE (APISO): 48 U/L (ref 33–130)
ALT: 11 U/L (ref 6–29)
AST: 15 U/L (ref 10–35)
Albumin: 4.2 g/dL (ref 3.6–5.1)
BUN: 20 mg/dL (ref 7–25)
CO2: 29 mmol/L (ref 20–32)
CREATININE: 0.84 mg/dL (ref 0.50–1.05)
Calcium: 9.4 mg/dL (ref 8.6–10.4)
Chloride: 101 mmol/L (ref 98–110)
GFR, Est African American: 93 mL/min/{1.73_m2} (ref >=60)
GFR, Est Non African American: 80 mL/min/{1.73_m2} (ref >=60)
GLUCOSE: 102 mg/dL — AB (ref 65–99)
Globulin: 2.5 g/dL (calc) (ref 1.9–3.7)
Potassium: 4.6 mmol/L (ref 3.5–5.3)
Sodium: 138 mmol/L (ref 135–146)
Total Bilirubin: 0.4 mg/dL (ref 0.2–1.2)
Total Protein: 6.7 g/dL (ref 6.1–8.1)

## 2018-03-12 LAB — CBC WITH DIFFERENTIAL/PLATELET
Basophils Absolute: 53 cells/uL (ref 0–200)
Basophils Relative: 0.9 %
Eosinophils Absolute: 112 cells/uL (ref 15–500)
Eosinophils Relative: 1.9 %
HEMATOCRIT: 37.8 % (ref 35.0–45.0)
Hemoglobin: 12.9 g/dL (ref 11.7–15.5)
LYMPHS ABS: 1475 {cells}/uL (ref 850–3900)
MCH: 32.4 pg (ref 27.0–33.0)
MCHC: 34.1 g/dL (ref 32.0–36.0)
MCV: 95 fL (ref 80.0–100.0)
MPV: 10.1 fL (ref 7.5–12.5)
Monocytes Relative: 6 %
NEUTROS PCT: 66.2 %
Neutro Abs: 3906 cells/uL (ref 1500–7800)
PLATELETS: 266 10*3/uL (ref 140–400)
RBC: 3.98 10*6/uL (ref 3.80–5.10)
RDW: 11.7 % (ref 11.0–15.0)
TOTAL LYMPHOCYTE: 25 %
WBC: 5.9 10*3/uL (ref 3.8–10.8)
WBCMIX: 354 {cells}/uL (ref 200–950)

## 2018-03-12 LAB — LIPID PANEL
CHOL/HDL RATIO: 3.5 (calc) (ref <5.0)
Cholesterol: 221 mg/dL — ABNORMAL HIGH (ref <200)
HDL: 64 mg/dL (ref >50)
LDL CHOLESTEROL (CALC): 138 mg/dL — AB
NON-HDL CHOLESTEROL (CALC): 157 mg/dL — AB (ref <130)
Triglycerides: 86 mg/dL (ref <150)

## 2018-03-12 LAB — HEMOGLOBIN A1C
EAG (MMOL/L): 5.8 (calc)
Hgb A1c MFr Bld: 5.3 % of total Hgb (ref <5.7)
Mean Plasma Glucose: 105 (calc)

## 2018-03-12 LAB — TSH: TSH: 1.07 mIU/L

## 2018-03-12 LAB — VITAMIN B12: Vitamin B-12: 391 pg/mL (ref 200–1100)

## 2018-03-12 LAB — VITAMIN D 25 HYDROXY (VIT D DEFICIENCY, FRACTURES): VIT D 25 HYDROXY: 24 ng/mL — AB (ref 30–100)

## 2018-04-18 ENCOUNTER — Encounter: Payer: Self-pay | Admitting: Family Medicine

## 2018-04-18 DIAGNOSIS — E559 Vitamin D deficiency, unspecified: Secondary | ICD-10-CM | POA: Insufficient documentation

## 2018-04-18 DIAGNOSIS — E7800 Pure hypercholesterolemia, unspecified: Secondary | ICD-10-CM | POA: Insufficient documentation

## 2018-04-18 DIAGNOSIS — E538 Deficiency of other specified B group vitamins: Secondary | ICD-10-CM | POA: Insufficient documentation

## 2018-04-18 DIAGNOSIS — R7301 Impaired fasting glucose: Secondary | ICD-10-CM | POA: Insufficient documentation

## 2018-04-18 DIAGNOSIS — R739 Hyperglycemia, unspecified: Secondary | ICD-10-CM | POA: Insufficient documentation

## 2018-05-13 ENCOUNTER — Encounter: Payer: Self-pay | Admitting: Family Medicine

## 2018-05-14 ENCOUNTER — Encounter: Payer: Self-pay | Admitting: Nurse Practitioner

## 2018-05-14 ENCOUNTER — Ambulatory Visit: Payer: BC Managed Care – PPO | Admitting: Nurse Practitioner

## 2018-05-14 VITALS — BP 122/80 | HR 75 | Temp 98.2°F | Resp 16 | Ht 63.0 in

## 2018-05-14 DIAGNOSIS — M5136 Other intervertebral disc degeneration, lumbar region: Secondary | ICD-10-CM

## 2018-05-14 DIAGNOSIS — M545 Low back pain: Secondary | ICD-10-CM | POA: Diagnosis not present

## 2018-05-14 DIAGNOSIS — M5126 Other intervertebral disc displacement, lumbar region: Secondary | ICD-10-CM | POA: Diagnosis not present

## 2018-05-14 LAB — POCT URINALYSIS DIPSTICK
Appearance: NORMAL
BILIRUBIN UA: NEGATIVE
Glucose, UA: NEGATIVE
KETONES UA: NEGATIVE
Leukocytes, UA: NEGATIVE
NITRITE UA: NEGATIVE
PH UA: 8 (ref 5.0–8.0)
Protein, UA: NEGATIVE
RBC UA: NEGATIVE
SPEC GRAV UA: 1.01 (ref 1.010–1.025)
UROBILINOGEN UA: 0.2 U/dL

## 2018-05-14 NOTE — Progress Notes (Addendum)
Name: Cathy Gray   MRN: [id number]    DOB: [birth year]   Date:05/14/2018       Progress Note  Subjective  Chief Complaint  Chief Complaint  Patient presents with  . Urinary Tract Infection    She complains of right flank pain and back pain x 4 days. She had severe back pain over the weekend and she treated it with Meloxicam with fair improvement. She denies urinary urgency, freqency and dysuria. She has a history of  recurrent UTI.     HPI    Patient endorses lower back pain shooting around to bilateral flanks, has some pressure as well. Symptoms have been ongoing for 3 days noticed after walking around [place]. A lot.  Was in the car all day yesterday- drove from [place] and made it worse. Laying on stomach helped pain a little bit  Denies dysuria, urinary frequency and urgency, suprapubic pain, and hematuria, fevers, chills, flank pain, CVA tenderness, nausea, vomiting,vaginal discharge, itching or pain.  Patient has tried meloxicam helped take edge off; tried flexeril at night which helped her sleep. States pain was better when pushing down on hips.   Patient Active Problem List   Diagnosis Date Noted  . Bulging lumbar disc 05/14/2018  . Hypercholesteremia 04/18/2018  . Hyperglycemia 04/18/2018  . Vitamin D deficiency 04/18/2018  . B12 deficiency 04/18/2018  . History of recent fall 09/10/2017  . Obesity (BMI 30.0-34.9) 05/10/2017  . Asthma, moderate 07/30/2015  . Bilateral cataracts 07/30/2015  . Neuralgia neuritis, sciatic nerve 07/30/2015  . Recurrent UTI 07/30/2015    Past Medical History:  Diagnosis Date  . Allergy   . Asthma   . Bilateral cataracts   . Breast mass, left 12/07/2015  . Chronic sciatica of left side   . Colicky right upper quadrant pain   . Fever blister   . Hay fever   . Over weight     Past Surgical History:  Procedure Laterality Date  . ABDOMINAL HYSTERECTOMY    . BLADDER SUSPENSION  2007  . CESAREAN SECTION    . COLONOSCOPY WITH PROPOFOL N/A  08/13/2017   Procedure: COLONOSCOPY WITH PROPOFOL;  Surgeon: Jonathon Bellows, MD;  Location: [facility] END[facility]COPY;  Service: Gastroenterology;  Laterality: N/A;  . renal abscess  1994    Social History   Tobacco Use  . Smoking status: Never Smoker  . Smokeless tobacco: Never Used  Substance Use Topics  . Alcohol use: Yes    Alcohol/week: 0.0 oz    Comment: occasionally     Current Outpatient Medications:  .  albuterol (PROVENTIL HFA;VENTOLIN HFA) 108 (90 Base) MCG/ACT inhaler, Inhale 2 puffs into the lungs every 6 (six) hours as needed for wheezing or shortness of breath., Disp: 1 Inhaler, Rfl: 0 .  cyclobenzaprine (FLEXERIL) 5 MG tablet, Take 1 tablet (5 mg total) by mouth 3 (three) times daily as needed., Disp: 90 tablet, Rfl: 0 .  meloxicam (MOBIC) 15 MG tablet, Take 1 tablet (15 mg total) by mouth daily., Disp: 30 tablet, Rfl: 2 .  Pediatric Multivit-Minerals-C (GUMMI BEAR MULTIVITAMIN/MIN) CHEW, Chew 2 tablets by mouth daily., Disp: , Rfl:  .  WIXELA INHUB 250-50 MCG/DOSE AEPB, Inhale 1 puff into the lungs 2 (two) times daily., Disp: 60 each, Rfl: 5 .  triamcinolone cream (KENALOG) 0.1 %, , Disp: , Rfl:   Allergies  Allergen Reactions  . Nitrofurantoin Anaphylaxis  . Amoxicillin     rash  . Gabapentin   . Molds & Smuts   .  Montelukast Sodium     tachycardia  . Nitrofurantoin Monohyd Macro Diarrhea, Itching and Nausea Only    Other reaction(s): Abdominal pain, Dizziness, Unconsciousness  . Penicillin V Potassium Other (See Comments)  . Pollen Extract     ROS   No other specific complaints in a complete review of systems (except as listed in HPI above).  Objective  Vitals:   05/14/18 0912  BP: 122/80  Pulse: 75  Resp: 16  Temp: 98.2 F (36.8 C)  TempSrc: Oral  SpO2: 98%  Height: 5\' 3"  (1.6 m)     Body mass index is 30.63 kg/m.  Nursing Note and Vital Signs reviewed.  Physical Exam   Constitutional: Patient appears well-developed and well-nourished.  No  distress.  Cardiovascular: Normal rate, regular rhythm, S1/S2 present.  No murmur or rub heard.  Pulmonary/Chest: Effort normal and breath sounds clear. No respiratory distress or retractions. Abdominal: Soft and non-tender, bowel sounds present, no CVA tenderness MSK: Full ROM with thoracic and lumbar spine. Positive right straight leg test  Psychiatric: Patient has a normal mood and affect. behavior is normal. Judgment and thought content normal.  Results for orders placed or performed in visit on 05/14/18 (from the past 72 hour(s))  POCT Urinalysis Dipstick     Status: Normal   Collection Time: 05/14/18  9:27 AM  Result Value Ref Range   Color, UA yellow    Clarity, UA clear    Glucose, UA Negative Negative   Bilirubin, UA Negative    Ketones, UA Negative    Spec Grav, UA 1.010 1.010 - 1.025   Blood, UA Negative    pH, UA 8.0 5.0 - 8.0   Protein, UA Negative Negative   Urobilinogen, UA 0.2 0.2 or 1.0 E.U./dL   Nitrite, UA Negative    Leukocytes, UA Negative Negative   Appearance normal    Odor none     Assessment & Plan  1. Acute midline low back pain, with sciatica presence unspecified Negative UTI, likely MSK, rest, drink plenty of water; follow up if unimproved.  - POCT Urinalysis Dipstick  2. Bulging lumbar disc - Take meloxicam and flexeril PRN; refer to ortho as needed.    -Red flags and when to present for emergency care or RTC including fever >101.84F, chest pain, shortness of breath, new/worsening/un-resolving symptoms, bowel/bladder incontinence reviewed with patient at time of visit. Follow up and care instructions discussed and provided in AVS.  --------------------------------------------------- I have reviewed this encounter including the documentation in this note and/or discussed this patient with the provider, Suezanne Cheshire DNP AGNP-C. I am certifying that I agree with the content of this note as supervising physician. Enid Derry, [facility] 05/15/2018, 4:36 PM

## 2018-06-20 ENCOUNTER — Ambulatory Visit: Payer: BC Managed Care – PPO | Admitting: Podiatry

## 2018-06-27 ENCOUNTER — Ambulatory Visit: Payer: BC Managed Care – PPO | Admitting: Podiatry

## 2018-06-27 ENCOUNTER — Encounter: Payer: Self-pay | Admitting: Podiatry

## 2018-06-27 ENCOUNTER — Ambulatory Visit (INDEPENDENT_AMBULATORY_CARE_PROVIDER_SITE_OTHER): Payer: BC Managed Care – PPO

## 2018-06-27 DIAGNOSIS — M545 Low back pain, unspecified: Secondary | ICD-10-CM

## 2018-06-27 DIAGNOSIS — M722 Plantar fascial fibromatosis: Secondary | ICD-10-CM | POA: Diagnosis not present

## 2018-06-27 MED ORDER — MELOXICAM 15 MG PO TABS: 15.0000 mg | ORAL_TABLET | Freq: Every day | ORAL | 2 refills | Status: DC

## 2018-06-27 NOTE — Progress Notes (Signed)
This patient presents the office with chief complaint of pain in her left heel.  She states that shin in the morning upon rising and standing from a sitting position.  She says her left heel is very painful at night.  She says she has used ibuprofen and ice on her foot but the pain continues.  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  She says that this pain has been present for approximately 1-2 months and feels like walking on a rock.  She denies any history of trauma or injury to the foot.  She presents the office today for an evaluation and treatment of this painful left heel.  General Appearance  Alert, conversant and in no acute stress.  Vascular  Dorsalis pedis and posterior tibial  pulses are palpable  bilaterally.  Capillary return is within normal limits  bilaterally. Temperature is within normal limits  bilaterally.  Neurologic  Senn-Weinstein monofilament wire test within normal limits  bilaterally. Muscle power within normal limits bilaterally.  Nails normal nails noted with no evidence of  any bacterial or fungal infection.  Orthopedic  No limitations of motion of motion feet .  No crepitus or effusions noted.  No bony pathology or digital deformities noted. Palpable pain noted at the insertion of the plantar fascia medial tuberosity left heel.  Pain noted also on the medial edge of the calcaneus left foot.  Swelling noted at the insertion of the plantar fascia left.  Skin  normotropic skin with no porokeratosis noted bilaterally.  No signs of infections or ulcers noted.    Plantar fasciitis  Left heel.  IE.  X-rays taken reveal calcification at the insertion of the plantar fascia left heel.  Small osteophyte noted at the attachment of the Achilles tendon left heel.  Discussed this condition with this patient.  Told her to use ice and stretching at home.  Prescribed power step insoles to be worn in her shoes.  Prescribed Mobic daily.   Injection therapy left heel.  Injection therapy using 1.0 cc. Of 2% xylocaine( 20 mg.) plus 1 cc. of kenalog-la ( 10 mg) plus 1/2 cc. of dexamethazone phosphate ( 2 mg).  RTC 2 weeks.   Gardiner Barefoot DPM

## 2018-06-27 NOTE — Addendum Note (Signed)
Addended by: Graceann Congress D on: 06/27/2018 02:16 PM   Modules accepted: Orders

## 2018-07-15 ENCOUNTER — Encounter: Payer: Self-pay | Admitting: Podiatry

## 2018-07-15 ENCOUNTER — Ambulatory Visit (INDEPENDENT_AMBULATORY_CARE_PROVIDER_SITE_OTHER): Payer: BC Managed Care – PPO | Admitting: Podiatry

## 2018-07-15 DIAGNOSIS — M722 Plantar fascial fibromatosis: Secondary | ICD-10-CM

## 2018-07-15 NOTE — Progress Notes (Signed)
His patient presents to the office for continued evaluation and treatment of her left heel.  She was diagnosed with plantar fasciitis approximately 3 weeks ago.  She was treated with power step insoles. Mobic  and injection therapy in the left heel.  He says that she has gone from a 10 pain level down to a 3 pain level.  She says she still does have pain and discomfort upon rising but also has been doing stretching exercises.  She returns to the office today for continued evaluation and treatment of her left heel.  Vascular  Dorsalis pedis and posterior tibial pulses are palpable  B/L.  Capillary return  WNL.  Temperature gradient is  WNL.  Skin turgor  WNL  Sensorium  Senn Weinstein monofilament wire  WNL. Normal tactile sensation.   Nail Exam  Patient has normal nails with no evidence of bacterial or fungal infection.  Orthopedic  Exam  Muscle tone and muscle strength  WNL.  No limitations of motion feet  B/L.  No crepitus or joint effusion noted.  Foot type is unremarkable and digits show no abnormalities.  Bony prominences are unremarkable Patient has decreased swelling in the left heel and no evidence of palpable pain left heel.  Skin  No open lesions.  Normal skin texture and turgor.  Plantar fascitis left heel  ROV.  Patient is pleased with her progress.  We discussed her condition and decided to send her to see [facility] or orthotic casting.  Return to the clinic as needed for pain evaluation.   Gardiner Barefoot DPM

## 2018-07-31 ENCOUNTER — Ambulatory Visit (INDEPENDENT_AMBULATORY_CARE_PROVIDER_SITE_OTHER): Payer: BC Managed Care – PPO | Admitting: Orthotics

## 2018-07-31 DIAGNOSIS — M722 Plantar fascial fibromatosis: Secondary | ICD-10-CM

## 2018-07-31 NOTE — Addendum Note (Signed)
Addended by: Velora Heckler on: 07/31/2018 09:11 AM   Modules accepted: Level of Service

## 2018-07-31 NOTE — Progress Notes (Signed)

## 2018-08-21 ENCOUNTER — Other Ambulatory Visit: Payer: BC Managed Care – PPO | Admitting: Orthotics

## 2018-09-04 ENCOUNTER — Ambulatory Visit: Payer: BC Managed Care – PPO | Admitting: Orthotics

## 2018-09-04 DIAGNOSIS — M722 Plantar fascial fibromatosis: Secondary | ICD-10-CM

## 2018-09-04 NOTE — Progress Notes (Signed)
Patient came in today to pick up custom made foot orthotics.  The goals were accomplished and the patient reported no dissatisfaction with said orthotics.  Patient was advised of breakin period and how to report any issues. 

## 2018-09-16 ENCOUNTER — Encounter

## 2018-09-16 ENCOUNTER — Ambulatory Visit: Payer: BC Managed Care – PPO | Admitting: Podiatry

## 2018-09-30 ENCOUNTER — Ambulatory Visit (INDEPENDENT_AMBULATORY_CARE_PROVIDER_SITE_OTHER): Payer: BC Managed Care – PPO | Admitting: Family Medicine

## 2018-09-30 ENCOUNTER — Encounter: Payer: Self-pay | Admitting: Family Medicine

## 2018-09-30 VITALS — BP 134/70 | HR 86 | Temp 98.2°F | Resp 16 | Ht 63.0 in | Wt 177.1 lb

## 2018-09-30 DIAGNOSIS — E538 Deficiency of other specified B group vitamins: Secondary | ICD-10-CM

## 2018-09-30 DIAGNOSIS — M545 Low back pain, unspecified: Secondary | ICD-10-CM

## 2018-09-30 DIAGNOSIS — E785 Hyperlipidemia, unspecified: Secondary | ICD-10-CM | POA: Diagnosis not present

## 2018-09-30 DIAGNOSIS — M5136 Other intervertebral disc degeneration, lumbar region: Secondary | ICD-10-CM

## 2018-09-30 DIAGNOSIS — M5126 Other intervertebral disc displacement, lumbar region: Secondary | ICD-10-CM

## 2018-09-30 DIAGNOSIS — J4540 Moderate persistent asthma, uncomplicated: Secondary | ICD-10-CM | POA: Diagnosis not present

## 2018-09-30 DIAGNOSIS — Z0000 Encounter for general adult medical examination without abnormal findings: Secondary | ICD-10-CM | POA: Diagnosis not present

## 2018-09-30 DIAGNOSIS — M51369 Other intervertebral disc degeneration, lumbar region without mention of lumbar back pain or lower extremity pain: Secondary | ICD-10-CM

## 2018-09-30 DIAGNOSIS — M722 Plantar fascial fibromatosis: Secondary | ICD-10-CM

## 2018-09-30 NOTE — Patient Instructions (Signed)
Preventive Care 40-64 Years, Female Preventive care refers to lifestyle choices and visits with your health care provider that can promote health and wellness. What does preventive care include?  A yearly physical exam. This is also called an annual well check.  Dental exams once or twice a year.  Routine eye exams. Ask your health care provider how often you should have your eyes checked.  Personal lifestyle choices, including: ? Daily care of your teeth and gums. ? Regular physical activity. ? Eating a healthy diet. ? Avoiding tobacco and drug use. ? Limiting alcohol use. ? Practicing safe sex. ? Taking low-dose aspirin daily starting at age 58. ? Taking vitamin and mineral supplements as recommended by your health care provider. What happens during an annual well check? The services and screenings done by your health care provider during your annual well check will depend on your age, overall health, lifestyle risk factors, and family history of disease. Counseling Your health care provider may ask you questions about your:  Alcohol use.  Tobacco use.  Drug use.  Emotional well-being.  Home and relationship well-being.  Sexual activity.  Eating habits.  Work and work Statistician.  Method of birth control.  Menstrual cycle.  Pregnancy history.  Screening You may have the following tests or measurements:  Height, weight, and BMI.  Blood pressure.  Lipid and cholesterol levels. These may be checked every 5 years, or more frequently if you are over 81 years old.  Skin check.  Lung cancer screening. You may have this screening every year starting at age 78 if you have a 30-pack-year history of smoking and currently smoke or have quit within the past 15 years.  Fecal occult blood test (FOBT) of the stool. You may have this test every year starting at age 65.  Flexible sigmoidoscopy or colonoscopy. You may have a sigmoidoscopy every 5 years or a colonoscopy  every 10 years starting at age 30.  Hepatitis C blood test.  Hepatitis B blood test.  Sexually transmitted disease (STD) testing.  Diabetes screening. This is done by checking your blood sugar (glucose) after you have not eaten for a while (fasting). You may have this done every 1-3 years.  Mammogram. This may be done every 1-2 years. Talk to your health care provider about when you should start having regular mammograms. This may depend on whether you have a family history of breast cancer.  BRCA-related cancer screening. This may be done if you have a family history of breast, ovarian, tubal, or peritoneal cancers.  Pelvic exam and Pap test. This may be done every 3 years starting at age 80. Starting at age 36, this may be done every 5 years if you have a Pap test in combination with an HPV test.  Bone density scan. This is done to screen for osteoporosis. You may have this scan if you are at high risk for osteoporosis.  Discuss your test results, treatment options, and if necessary, the need for more tests with your health care provider. Vaccines Your health care provider may recommend certain vaccines, such as:  Influenza vaccine. This is recommended every year.  Tetanus, diphtheria, and acellular pertussis (Tdap, Td) vaccine. You may need a Td booster every 10 years.  Varicella vaccine. You may need this if you have not been vaccinated.  Zoster vaccine. You may need this after age 5.  Measles, mumps, and rubella (MMR) vaccine. You may need at least one dose of MMR if you were born in  1957 or later. You may also need a second dose.  Pneumococcal 13-valent conjugate (PCV13) vaccine. You may need this if you have certain conditions and were not previously vaccinated.  Pneumococcal polysaccharide (PPSV23) vaccine. You may need one or two doses if you smoke cigarettes or if you have certain conditions.  Meningococcal vaccine. You may need this if you have certain  conditions.  Hepatitis A vaccine. You may need this if you have certain conditions or if you travel or work in places where you may be exposed to hepatitis A.  Hepatitis B vaccine. You may need this if you have certain conditions or if you travel or work in places where you may be exposed to hepatitis B.  Haemophilus influenzae type b (Hib) vaccine. You may need this if you have certain conditions.  Talk to your health care provider about which screenings and vaccines you need and how often you need them. This information is not intended to replace advice given to you by your health care provider. Make sure you discuss any questions you have with your health care provider. Document Released: 11/19/2015 Document Revised: 07/12/2016 Document Reviewed: 08/24/2015 Elsevier Interactive Patient Education  2018 [employer] Inc.  

## 2018-09-30 NOTE — Progress Notes (Signed)
Name: Cathy Gray   MRN: [id number]    DOB: 1965-12-02   Date:09/30/2018       Progress Note  Subjective  Chief Complaint  Chief Complaint  Patient presents with  . Annual Exam    HPI   Patient presents for annual CPE .  Diet: she just now moved to her house, was eating out often prior to that  Exercise: she was more active, living in an apartment and going up and down stairs, but not recently because of back and foot pain      Office Visit from 09/30/2018 in [facility]  AUDIT-C Score  1     Depression:  Depression screen [facility] 2/9 09/30/2018 06/08/2017 10/04/2016 06/15/2016 06/08/2015  Decreased Interest 0 0 0 0 0  Down, Depressed, Hopeless 0 0 0 0 0  PHQ - 2 Score 0 0 0 0 0   Hypertension: BP Readings from Last 3 Encounters:  09/30/18 134/70  05/14/18 122/80  03/11/18 102/60   Obesity: Wt Readings from Last 3 Encounters:  09/30/18 177 lb 1.6 oz (80.3 kg)  03/11/18 172 lb 14.4 oz (78.4 kg)  09/10/17 171 lb 12.8 oz (77.9 kg)   BMI Readings from Last 3 Encounters:  09/30/18 31.37 kg/m  05/14/18 30.63 kg/m  03/11/18 30.63 kg/m    Hep C Screening: next time we check labs  STD testing and prevention (HIV/chl/gon/syphilis): N/A Intimate partner violence:negative  Screen  Sexual History/Pain during Intercourse: some vaginal dryness Menstrual History/LMP/Abnormal Bleeding: s/p hysterectomy  Incontinence Symptoms: no symptoms   Advanced Care Planning: A voluntary discussion about advance care planning including the explanation and discussion of advance directives.  Discussed health care proxy and Living will, and the patient was able to identify a health care proxy as husband.  Patient does not know have a living will at present time.   Breast cancer:  HM Mammogram  Date Value Ref Range Status  01/01/2017 Self Reported Normal 0-4 Bi-Rad, Self Reported Normal Final    Comment:    [facility]-Normal Diagnostic Mammogram    BRCA gene screening:  N/A Cervical cancer screening: due for repeat in 2020 , she will go to gyn   Osteoporosis Screening: discussed high calcium diet  Lipids:  Lab Results  Component Value Date   CHOL 221 (H) 03/11/2018   CHOL 249 (H) 10/04/2016   CHOL 208 (A) 09/11/2014   Lab Results  Component Value Date   HDL 64 03/11/2018   HDL 63 10/04/2016   HDL 69 09/11/2014   Lab Results  Component Value Date   LDLCALC 138 (H) 03/11/2018   LDLCALC 162 (H) 10/04/2016   LDLCALC 122 09/11/2014   Lab Results  Component Value Date   TRIG 86 03/11/2018   TRIG 121 10/04/2016   TRIG 85 09/11/2014   Lab Results  Component Value Date   CHOLHDL 3.5 03/11/2018   CHOLHDL 4.0 10/04/2016   No results found for: LDLDIRECT  Glucose:  Glucose  Date Value Ref Range Status  12/09/2011 145 (H) 65 - 99 mg/dL Final   Glucose, Bld  Date Value Ref Range Status  03/11/2018 102 (H) 65 - 99 mg/dL Final    Comment:    .            Fasting reference interval . For someone without known diabetes, a glucose value between 100 and 125 mg/dL is consistent with prediabetes and should be confirmed with a follow-up test. .   10/04/2016 91 65 - 99 mg/dL  Final    Skin cancer: discussed atypical lesions  Colorectal cancer: was done in 2018 repeat in 5 years  Lung cancer:  Low Dose CT Chest recommended if Age 46-80 years, 30 pack-year currently smoking OR have quit w/in 15years. Patient does not qualify.   [id number]   Patient Active Problem List   Diagnosis Date Noted  . Bulging lumbar disc 05/14/2018  . Hypercholesteremia 04/18/2018  . Hyperglycemia 04/18/2018  . Vitamin D deficiency 04/18/2018  . B12 deficiency 04/18/2018  . History of recent fall 09/10/2017  . Obesity (BMI 30.0-34.9) 05/10/2017  . Asthma, moderate 07/30/2015  . Bilateral cataracts 07/30/2015  . Neuralgia neuritis, sciatic nerve 07/30/2015  . Recurrent UTI 07/30/2015    Past Surgical History:  Procedure Laterality Date  . ABDOMINAL  HYSTERECTOMY    . BLADDER SUSPENSION  2007  . CESAREAN SECTION  2001  . COLONOSCOPY WITH PROPOFOL N/A 08/13/2017   Procedure: COLONOSCOPY WITH PROPOFOL;  Surgeon: Jonathon Bellows, MD;  Location: [facility] ENDOSCOPY;  Service: Gastroenterology;  Laterality: N/A;  . renal abscess  1994    Family History  Problem Relation Age of Onset  . Heart disease Father   . Asthma Father   . Diverticulosis Father   . Cancer Sister        Thyroid  . Anxiety disorder Daughter   . OCD Daughter   . Seizures Son        1  . Hyperlipidemia Maternal Grandmother   . Diverticulosis Maternal Grandmother   . Diabetes Maternal Grandfather   . Asthma Paternal Grandmother   . Hyperlipidemia Paternal Grandmother     Social History   Socioeconomic History  . Marital status: Married    Spouse name: Arva Chafe  . Number of children: 2  . Years of education: Not on file  . Highest education level: Some college, no degree  Occupational History  . Not on file  Social Needs  . Financial resource strain: Not hard at all  . Food insecurity:    Worry: Never true    Inability: Never true  . Transportation needs:    Medical: No    Non-medical: No  Tobacco Use  . Smoking status: Never Smoker  . Smokeless tobacco: Never Used  Substance and Sexual Activity  . Alcohol use: Yes    Alcohol/week: 0.0 standard drinks    Comment: occasionally  . Drug use: No  . Sexual activity: Yes    Partners: Male    Birth control/protection: Other-see comments    Comment: Hysterectomy  Lifestyle  . Physical activity:    Days per week: 0 days    Minutes per session: 0 min  . Stress: Not at all  Relationships  . Social connections:    Talks on phone: More than three times a week    Gets together: More than three times a week    Attends religious service: More than 4 times per year    Active member of club or organization: No    Attends meetings of clubs or organizations: Never    Relationship status: Married  . Intimate partner  violence:    Fear of current or ex partner: No    Emotionally abused: No    Physically abused: No    Forced sexual activity: No  Other Topics Concern  . Not on file  Social History Narrative  . Not on file     Current Outpatient Medications:  .  albuterol (PROVENTIL HFA;VENTOLIN HFA) 108 (90 Base) MCG/ACT inhaler, Inhale  2 puffs into the lungs every 6 (six) hours as needed for wheezing or shortness of breath., Disp: 1 Inhaler, Rfl: 0 .  cyclobenzaprine (FLEXERIL) 5 MG tablet, Take 1 tablet (5 mg total) by mouth 3 (three) times daily as needed., Disp: 90 tablet, Rfl: 0 .  meloxicam (MOBIC) 15 MG tablet, Take 1 tablet (15 mg total) by mouth daily., Disp: 30 tablet, Rfl: 2 .  Pediatric Multivit-Minerals-C (GUMMI BEAR MULTIVITAMIN/MIN) CHEW, Chew 2 tablets by mouth daily., Disp: , Rfl:  .  WIXELA INHUB 250-50 MCG/DOSE AEPB, Inhale 1 puff into the lungs 2 (two) times daily., Disp: 60 each, Rfl: 5 .  Aspirin-Calcium Carbonate 81-777 MG TABS, Take by mouth., Disp: , Rfl:  .  triamcinolone cream (KENALOG) 0.1 %, , Disp: , Rfl:   Allergies  Allergen Reactions  . Nitrofurantoin Anaphylaxis  . Amoxicillin     rash  . Gabapentin   . Molds & Smuts   . Montelukast Sodium     tachycardia  . Nitrofurantoin Monohyd Macro Diarrhea, Itching and Nausea Only    Other reaction(s): Abdominal pain, Dizziness, Unconsciousness  . Penicillin V Potassium Other (See Comments)  . Pollen Extract      ROS  Constitutional: Negative for fever or weight change.  Respiratory: Negative for cough and shortness of breath.   Cardiovascular: Negative for chest pain or palpitations.  Gastrointestinal: Negative for abdominal pain, no bowel changes.  Musculoskeletal: Negative for gait problem or joint swelling. Problems walking because of plantar fasciitis.  Skin: Negative for rash.  Neurological: Negative for dizziness or headache.  No other specific complaints in a complete review of systems (except as listed in  HPI above).  Objective  Vitals:   09/30/18 1118  BP: 134/70  Pulse: 86  Resp: 16  Temp: 98.2 F (36.8 C)  TempSrc: Oral  SpO2: 99%  Weight: 177 lb 1.6 oz (80.3 kg)  Height: _0  (1.6 m)    Body mass index is 31.37 kg/m.  Physical Exam  Constitutional: Patient appears well-developed and well-nourished. No distress.  HENT: Head: Normocephalic and atraumatic. Ears: B TMs ok, no erythema or effusion; Nose: Nose normal. Mouth/Throat: Oropharynx is clear and moist. No oropharyngeal exudate.  Eyes: Conjunctivae and EOM are normal. Pupils are equal, round, and reactive to light. No scleral icterus.  Neck: Normal range of motion. Neck supple. No JVD present. No thyromegaly present.  Cardiovascular: Normal rate, regular rhythm and normal heart sounds.  No murmur heard. No BLE edema. Pulmonary/Chest: Effort normal and breath sounds normal. No respiratory distress. Abdominal: Soft. Bowel sounds are normal, no distension. There is no tenderness. no masses Breast: no lumps or masses, no nipple discharge or rashes FEMALE GENITALIA:  Not done , sees GYN  RECTAL: not done  Musculoskeletal: Normal range of motion, no joint effusions. No gross deformities Neurological: he is alert and oriented to person, place, and time. No cranial nerve deficit. Coordination, balance, strength, speech and gait are normal.  Skin: Skin is warm and dry. No rash noted. No erythema.  Psychiatric: Patient has a normal mood and affect. behavior is normal. Judgment and thought content normal.   PHQ2/9: Depression screen [facility] 2/9 09/30/2018 06/08/2017 10/04/2016 06/15/2016 06/08/2015  Decreased Interest 0 0 0 0 0  Down, Depressed, Hopeless 0 0 0 0 0  PHQ - 2 Score 0 0 0 0 0     Fall Risk: Fall Risk  09/30/2018 05/14/2018 03/11/2018 09/10/2017 06/08/2017  Falls in the past year? 0 No No No  No  Number falls in past yr: 0 - - - -     Functional Status Survey: Is the patient deaf or have difficulty hearing?: No Does the  patient have difficulty seeing, even when wearing glasses/contacts?: Yes Does the patient have difficulty concentrating, remembering, or making decisions?: No Does the patient have difficulty walking or climbing stairs?: Yes(plantar fasciitis) Does the patient have difficulty dressing or bathing?: No Does the patient have difficulty doing errands alone such as visiting a doctor's office or shopping?: No   Assessment & Plan  .1. Wellness examination  -USPSTF grade A and B recommendations reviewed with patient; age-appropriate recommendations, preventive care, screening tests, etc discussed and encouraged; healthy living encouraged; see AVS for patient education given to patient -Discussed importance of 150 minutes of physical activity weekly, eat two servings of fish weekly, eat one serving of tree nuts ( cashews, pistachios, pecans, almonds.Marland Kitchen) every other day, eat 6 servings of fruit/vegetables daily and drink plenty of water and avoid sweet beverages.

## 2019-01-06 ENCOUNTER — Ambulatory Visit: Payer: BC Managed Care – PPO | Admitting: Nurse Practitioner

## 2019-01-06 ENCOUNTER — Encounter: Payer: Self-pay | Admitting: Nurse Practitioner

## 2019-01-06 VITALS — BP 122/74 | HR 99 | Temp 98.2°F | Resp 16 | Ht 63.0 in | Wt 179.3 lb

## 2019-01-06 DIAGNOSIS — R109 Unspecified abdominal pain: Secondary | ICD-10-CM | POA: Diagnosis not present

## 2019-01-06 DIAGNOSIS — K529 Noninfective gastroenteritis and colitis, unspecified: Secondary | ICD-10-CM | POA: Diagnosis not present

## 2019-01-06 DIAGNOSIS — R197 Diarrhea, unspecified: Secondary | ICD-10-CM

## 2019-01-06 DIAGNOSIS — R112 Nausea with vomiting, unspecified: Secondary | ICD-10-CM

## 2019-01-06 MED ORDER — ONDANSETRON 4 MG PO TBDP: 4.0000 mg | ORAL_TABLET | Freq: Three times a day (TID) | ORAL | 0 refills | Status: DC | PRN

## 2019-01-06 MED ORDER — DICYCLOMINE HCL 10 MG PO CAPS: 10.0000 mg | ORAL_CAPSULE | Freq: Three times a day (TID) | ORAL | 0 refills | Status: DC

## 2019-01-06 NOTE — Patient Instructions (Addendum)
Gastroenteritis (also known as stomach flu, although unrelated to influenza) is inflammation of the gastrointestinal tract, involving both the stomach and intestines. How did I get it? Gastroenteritis can have many causes, including viral or bacterial infections, medication reactions, food allergies, food/water poisoning or abuse of laxatives or alcohol. The duration and severity of the condition is relative to the illness. What are the symptoms? Symptoms can include fatigue, lack of appetite, abdominal growling and cramping, nausea, vomiting and/or diarrhea and are usually brief. Typically, no serious consequences occur and the condition resolves itself in a few days without medical treatment. How do I treat it? -Take nausea medicine as needed for nausea. If needed you can take loperamide (Imodium) for diarrhea, but avoid taking it for more than 2 days at a time as it can then lead to constipation.  -Drink fluids and get plenty of rest. Do not consume alcohol or caffeine. Avoid medications containing aspirin or ibuprofen, which may irritate your stomach, and do not take any medications by mouth unless directed by your medical care provider. 1. Drink clear liquids. Sip water/half-strength sports drinks or suck on ice chips. If you vomit using this treatment, do not take anything for 1 hour and start over again. 2. If you do not vomit fluids, you may progress to full-strength sports drinks; popsicles; clear broth; bouillon; decaf tea; clear apple juice; plain-flavored gelatin; and half-strength, clear, carbonated beverages without fizz (ginger ale, lemon-lime sodas, etc.). NOTE: To remove the fizz from soda, pour some into a glass and stir with a spoon. 3. As you become hungry, try moving to soft foods. Some examples include: saltine crackers, dry white bread/toast, bananas, apple sauce, plain white rice, soft cereals prepared with water, plain noodles and broth soups. Do not use sauces or condiments,  including butter. You may return to a normal diet as tolerated within 24 hours after recovery from vomiting. Recommended diets THINGS TO AVOID WHILE RECOVERING: . Alcohol  . Caffeine  . Dairy products  . Citrus products  . Fatty, greasy and/or fried foods  . Raw fruits and vegetables  . Aspirin  . Ibuprofen CLEAR LIQUID DIET: . Apple, grape or cranberry juice  . Kool-Aid  . Fruit punch  . Gatorade  . Ginger ale or 7UP  . Decaf tea  . Clear bouillon  . Jello  . Popsicles  . Fruit ice  . Salt FULL LIQUID DIET: . Cocoa  . Carbonated, decaf beverages  . Broth  . Strained, bland soups  . Cream of wheat or rice cereals  . Farina  . Vegetable juices  . Strained fruit juices or nectars  . Sherbets  . Honey  . Cinnamon  . Nutmeg  . Vanilla or other extracts CLEAR LIQUID DIET, PLUS: . Coffee  . White bread or toast  . Cooked or ready-to-eat cereal (no bran)  . Graham crackers  . Saltines  . Pasta or rice  . Soft, cooked vegetables  . Boiled or mashed potatoes  . Apple sauce  . Bananas or seedless melon  . Cooked or canned fruits  . Mild cheese or cottage cheese SOFT FULL LIQUID DIET, PLUS: . Soft-cooked, poached or hard-boiled or scrambled eggs  . Tender meat, fish or poultry  . Soft cake or cookies without nuts or raisins  . Butter, cream or margarine  . Jelly  SEEK MEDICAL TREATMENT IF: . You are unable to keep fluids down.  . You see blood or mucus in your stool.  . You vomit black   or dark red material.  . You have a fever of 101?F (38.33?C) or higher.  . You have localized and/or persistent abdominal pain.    

## 2019-01-06 NOTE — Progress Notes (Signed)
Name: Cathy Gray   MRN: [id number]    DOB: 29-Jun-1966   Date:01/06/2019       Progress Note  Subjective  Chief Complaint  Chief Complaint  Patient presents with  . GI Problem    Nausea, vomitting and diarrhea. started on Sat morning. no solid stools. works as a Network engineer at [employer].    HPI  Patient endorses epigastric pain that started on Saturday morning- 4am woke up with nausea, vomiting. Later on in the day started having diarrhea. Vomiting resolved by the end of the day. States diarrhea has been continuing. She thought it was food poisoning but friend with shared meal did not have any symptoms. Patient was using alkaseltzer without relief and advil. States developed headache- relief with ibuprofen.   Patient Active Problem List   Diagnosis Date Noted  . Plantar fasciitis of left foot 09/30/2018  . Bulging lumbar disc 05/14/2018  . Hypercholesteremia 04/18/2018  . Hyperglycemia 04/18/2018  . Vitamin D deficiency 04/18/2018  . B12 deficiency 04/18/2018  . History of recent fall 09/10/2017  . Obesity (BMI 30.0-34.9) 05/10/2017  . Asthma, moderate 07/30/2015  . Bilateral cataracts 07/30/2015  . Neuralgia neuritis, sciatic nerve 07/30/2015  . Recurrent UTI 07/30/2015    Past Medical History:  Diagnosis Date  . Allergy   . Asthma   . Bilateral cataracts   . Breast mass, left 12/07/2015  . Chronic sciatica of left side   . Colicky right upper quadrant pain   . Fever blister   . Hay fever   . Over weight     Past Surgical History:  Procedure Laterality Date  . ABDOMINAL HYSTERECTOMY    . BLADDER SUSPENSION  2007  . CESAREAN SECTION  2001  . COLONOSCOPY WITH PROPOFOL N/A 08/13/2017   Procedure: COLONOSCOPY WITH PROPOFOL;  Surgeon: Jonathon Bellows, MD;  Location: [facility] ENDOSCOPY;  Service: Gastroenterology;  Laterality: N/A;  . renal abscess  1994    Social History   Tobacco Use  . Smoking status: Never Smoker  . Smokeless tobacco: Never Used  Substance Use Topics  .  Alcohol use: Yes    Alcohol/week: 0.0 standard drinks    Comment: occasionally     Current Outpatient Medications:  .  albuterol (PROVENTIL HFA;VENTOLIN HFA) 108 (90 Base) MCG/ACT inhaler, Inhale 2 puffs into the lungs every 6 (six) hours as needed for wheezing or shortness of breath., Disp: 1 Inhaler, Rfl: 0 .  Aspirin-Calcium Carbonate 81-777 MG TABS, Take by mouth., Disp: , Rfl:  .  cyclobenzaprine (FLEXERIL) 5 MG tablet, Take 1 tablet (5 mg total) by mouth 3 (three) times daily as needed., Disp: 90 tablet, Rfl: 0 .  meloxicam (MOBIC) 15 MG tablet, Take 1 tablet (15 mg total) by mouth daily., Disp: 30 tablet, Rfl: 2 .  Pediatric Multivit-Minerals-C (GUMMI BEAR MULTIVITAMIN/MIN) CHEW, Chew 2 tablets by mouth daily., Disp: , Rfl:  .  triamcinolone cream (KENALOG) 0.1 %, , Disp: , Rfl:  .  WIXELA INHUB 250-50 MCG/DOSE AEPB, Inhale 1 puff into the lungs 2 (two) times daily., Disp: 60 each, Rfl: 5  Allergies  Allergen Reactions  . Nitrofurantoin Anaphylaxis  . Amoxicillin     rash  . Gabapentin   . Molds & Smuts   . Montelukast Sodium     tachycardia  . Nitrofurantoin Monohyd Macro Diarrhea, Itching and Nausea Only    Other reaction(s): Abdominal pain, Dizziness, Unconsciousness  . Penicillin V Potassium Other (See Comments)  . Pollen Extract  ROS   No other specific complaints in a complete review of systems (except as listed in HPI above).  Objective  Vitals:   01/06/19 1018  BP: 122/74  Pulse: 99  Resp: 16  Temp: 98.2 F (36.8 C)  TempSrc: Oral  SpO2: 98%  Weight: 179 lb 4.8 oz (81.3 kg)  Height: 5\' 3"  (1.6 m)     Body mass index is 31.76 kg/m.  Nursing Note and Vital Signs reviewed.  Physical Exam Constitutional:      Appearance: Normal appearance.  HENT:     Head: Normocephalic and atraumatic.     Mouth/Throat:     Mouth: Mucous membranes are dry.     Pharynx: No oropharyngeal exudate or posterior oropharyngeal erythema.  Eyes:      Conjunctiva/sclera: Conjunctivae normal.  Cardiovascular:     Rate and Rhythm: Normal rate and regular rhythm.  Pulmonary:     Effort: Pulmonary effort is normal.     Breath sounds: Normal breath sounds.  Abdominal:     General: Bowel sounds are normal.     Palpations: Abdomen is soft. There is no mass.     Tenderness: There is no abdominal tenderness. There is no right CVA tenderness or left CVA tenderness.  Skin:    General: Skin is warm and dry.     Findings: Rash: see AVS.  Neurological:     General: No focal deficit present.     Mental Status: She is alert and oriented to person, place, and time.  Psychiatric:        Mood and Affect: Mood normal.        Behavior: Behavior normal.      No results found for this or any previous visit (from the past 48 hour(s)).  Assessment & Plan  1. Gastroenteritis See AVS; stool sample if unimproving. Take tylenol instead of NSAID - CBC w/Diff/Platelet - COMPLETE METABOLIC PANEL WITH GFR  2. Intractable vomiting with nausea, unspecified vomiting type - CBC w/Diff/Platelet - COMPLETE METABOLIC PANEL WITH GFR - ondansetron (ZOFRAN-ODT) 4 MG disintegrating tablet; Take 1 tablet (4 mg total) by mouth every 8 (eight) hours as needed for nausea or vomiting.  Dispense: 20 tablet; Refill: 0  3. Diarrhea, unspecified type OTC antidiarrheals no more than 2-3 days.  - CBC w/Diff/Platelet - COMPLETE METABOLIC PANEL WITH GFR  4. Abdominal cramping - dicyclomine (BENTYL) 10 MG capsule; Take 1 capsule (10 mg total) by mouth 3 (three) times daily before meals.  Dispense: 21 capsule; Refill: 0  -Red flags and when to present for emergency care or RTC including fever >101.45F, chest pain, shortness of breath, new/worsening/un-resolving symptoms,  reviewed with patient at time of visit. Follow up and care instructions discussed and provided in AVS.

## 2019-01-07 LAB — COMPLETE METABOLIC PANEL WITH GFR
AG RATIO: 1.5 (calc) (ref 1.0–2.5)
ALBUMIN MSPROF: 4.1 g/dL (ref 3.6–5.1)
ALT: 19 U/L (ref 6–29)
AST: 21 U/L (ref 10–35)
Alkaline phosphatase (APISO): 61 U/L (ref 37–153)
BILIRUBIN TOTAL: 0.4 mg/dL (ref 0.2–1.2)
BUN: 11 mg/dL (ref 7–25)
CALCIUM: 9.4 mg/dL (ref 8.6–10.4)
CHLORIDE: 105 mmol/L (ref 98–110)
CO2: 27 mmol/L (ref 20–32)
Creat: 0.82 mg/dL (ref 0.50–1.05)
GFR, EST AFRICAN AMERICAN: 95 mL/min/{1.73_m2} (ref >=60)
GFR, EST NON AFRICAN AMERICAN: 82 mL/min/{1.73_m2} (ref >=60)
GLOBULIN: 2.7 g/dL (ref 1.9–3.7)
Glucose, Bld: 88 mg/dL (ref 65–99)
POTASSIUM: 4.2 mmol/L (ref 3.5–5.3)
SODIUM: 140 mmol/L (ref 135–146)
TOTAL PROTEIN: 6.8 g/dL (ref 6.1–8.1)

## 2019-01-07 LAB — CBC WITH DIFFERENTIAL/PLATELET
Absolute Monocytes: 505 cells/uL (ref 200–950)
Basophils Absolute: 31 cells/uL (ref 0–200)
Basophils Relative: 0.6 %
EOS ABS: 128 {cells}/uL (ref 15–500)
EOS PCT: 2.5 %
HCT: 39.1 % (ref 35.0–45.0)
Hemoglobin: 13.5 g/dL (ref 11.7–15.5)
Lymphs Abs: 1632 cells/uL (ref 850–3900)
MCH: 32.3 pg (ref 27.0–33.0)
MCHC: 34.5 g/dL (ref 32.0–36.0)
MCV: 93.5 fL (ref 80.0–100.0)
MONOS PCT: 9.9 %
MPV: 9.7 fL (ref 7.5–12.5)
Neutro Abs: 2805 cells/uL (ref 1500–7800)
Neutrophils Relative %: 55 %
PLATELETS: 276 10*3/uL (ref 140–400)
RBC: 4.18 10*6/uL (ref 3.80–5.10)
RDW: 11.7 % (ref 11.0–15.0)
TOTAL LYMPHOCYTE: 32 %
WBC: 5.1 10*3/uL (ref 3.8–10.8)

## 2019-01-09 ENCOUNTER — Other Ambulatory Visit: Payer: Self-pay | Admitting: Obstetrics and Gynecology

## 2019-01-09 DIAGNOSIS — Z1231 Encounter for screening mammogram for malignant neoplasm of breast: Secondary | ICD-10-CM

## 2019-01-09 LAB — HM PAP SMEAR: HM Pap smear: NEGATIVE

## 2019-01-21 ENCOUNTER — Other Ambulatory Visit: Payer: Self-pay

## 2019-01-21 ENCOUNTER — Ambulatory Visit
Admission: RE | Admit: 2019-01-21 | Discharge: 2019-01-21 | Disposition: A | Discharge status: Home / Self Care | Payer: BC Managed Care – PPO | Source: Ambulatory Visit | Attending: Obstetrics and Gynecology | Admitting: Obstetrics and Gynecology

## 2019-01-21 DIAGNOSIS — Z1231 Encounter for screening mammogram for malignant neoplasm of breast: Secondary | ICD-10-CM | POA: Insufficient documentation

## 2019-02-17 ENCOUNTER — Encounter: Payer: Self-pay | Admitting: Podiatry

## 2019-02-17 ENCOUNTER — Other Ambulatory Visit: Payer: Self-pay

## 2019-02-17 ENCOUNTER — Ambulatory Visit: Payer: BC Managed Care – PPO | Admitting: Podiatry

## 2019-02-17 VITALS — Temp 99.4°F

## 2019-02-17 DIAGNOSIS — M722 Plantar fascial fibromatosis: Secondary | ICD-10-CM | POA: Diagnosis not present

## 2019-02-17 NOTE — Progress Notes (Signed)
This patient returns to the office stating that she has had continued pain noted in her left heel she says that she initially improved in September but the pain has returned and she has been exercising as well as icing and taking Advil to help control the pain.  She says the pain now is approximately 10 8 out of 10 and she experiences pain in her lower leg because of the way she walks.  She has also received orthotics from [facility] in an effort to help to control the pain in her left heel she says they have not been beneficial.  She presents the office today for an evaluation and treatment of her left heel.  General Appearance  Alert, conversant and in no acute stress.  Vascular  Dorsalis pedis and posterior tibial  pulses are palpable  bilaterally.  Capillary return is within normal limits  bilaterally. Temperature is within normal limits  bilaterally.  Neurologic  Senn-Weinstein monofilament wire test within normal limits  bilaterally. Muscle power within normal limits bilaterally.  Nails Normal nails noted with no evidence of fungal or bacterial infection.  Orthopedic  No limitations of motion  feet .  No crepitus or effusions noted.  No bony pathology or digital deformities noted. Palpable pain noted left heel.  Swelling left heel.  Skin  normotropic skin with no porokeratosis noted bilaterally.  No signs of infections or ulcers noted.    Plantar fasciitis left heel.  ROV.  Discussed this condition with this patient.  Told her she is a candidate for foot surgery by Dr. Milinda Pointer in the future.  In the meantime she was treated with injection therapy left heel as well as dispensed a cam walker for her to walk with the help to immobilize her foot and relieve her pain.  She is to return the office in 2 weeks for further evaluation and treatment  Gardiner Barefoot DPM

## 2019-03-03 ENCOUNTER — Other Ambulatory Visit: Payer: Self-pay

## 2019-03-03 ENCOUNTER — Encounter: Payer: Self-pay | Admitting: Podiatry

## 2019-03-03 ENCOUNTER — Ambulatory Visit: Payer: BC Managed Care – PPO | Admitting: Podiatry

## 2019-03-03 VITALS — Temp 97.9°F

## 2019-03-03 DIAGNOSIS — M722 Plantar fascial fibromatosis: Secondary | ICD-10-CM

## 2019-03-03 NOTE — Progress Notes (Signed)
This patient presents the office for continued evaluation and treatment of her left heel.  She was seen 2 weeks ago for an acute plantar fasciitis left heel.  She says that the injection and the cam walker have helped to reduce her pain and she is 60% improved.  She says that when she walks without the cam walker she does experience severe pain still.  She says that she has taken ibuprofen p.o. for pain.  I also reviewed her orthoses which she utilizes one orthoses in the cam walker.  She presents the office today for continued evaluation and treatment.  Vascular  Dorsalis pedis and posterior tibial pulses are palpable  B/L.  Capillary return  WNL.  Temperature gradient is  WNL.  Skin turgor  WNL  Sensorium  Senn Weinstein monofilament wire  WNL. Normal tactile sensation.  Nail Exam  Patient has normal nails with no evidence of bacterial or fungal infection.  Orthopedic  Exam  Muscle tone and muscle strength  WNL.  No limitations of motion feet  B/L.  No crepitus or joint effusion noted.  Foot type is unremarkable and digits show no abnormalities. Palpable pain left heel and through arch left foot.  Skin  No open lesions.  Normal skin texture and turgor.  Plantar Fasciitis left heel.    ROV.  Patient presented to the office today still wearing her cam walker and relates there is been improvement in her heel.  She has been taking Mobic but has limited her medication due to stomach problems.  I am pleased with the reduction in pain but this patient would benefit from having an EPF performed for release of the fascia and reduction in her pain.  Patient to the sent to Dr. Milinda Pointer for evaluation and treatment.    Gardiner Barefoot DPM

## 2019-03-10 ENCOUNTER — Encounter: Payer: Self-pay | Admitting: Family Medicine

## 2019-03-10 ENCOUNTER — Other Ambulatory Visit: Payer: Self-pay

## 2019-03-10 ENCOUNTER — Ambulatory Visit (INDEPENDENT_AMBULATORY_CARE_PROVIDER_SITE_OTHER): Payer: BC Managed Care – PPO | Admitting: Family Medicine

## 2019-03-10 VITALS — Temp 98.6°F | Ht 63.0 in

## 2019-03-10 DIAGNOSIS — J0100 Acute maxillary sinusitis, unspecified: Secondary | ICD-10-CM

## 2019-03-10 DIAGNOSIS — J4540 Moderate persistent asthma, uncomplicated: Secondary | ICD-10-CM | POA: Diagnosis not present

## 2019-03-10 DIAGNOSIS — M722 Plantar fascial fibromatosis: Secondary | ICD-10-CM | POA: Diagnosis not present

## 2019-03-10 MED ORDER — LEVOCETIRIZINE DIHYDROCHLORIDE 5 MG PO TABS: 5.0000 mg | ORAL_TABLET | Freq: Every evening | ORAL | 0 refills | Status: DC

## 2019-03-10 MED ORDER — DOXYCYCLINE HYCLATE 100 MG PO TABS: 100.0000 mg | ORAL_TABLET | Freq: Two times a day (BID) | ORAL | 0 refills | Status: DC

## 2019-03-10 MED ORDER — ALBUTEROL SULFATE HFA 108 (90 BASE) MCG/ACT IN AERS: 2.0000 | INHALATION_SPRAY | Freq: Four times a day (QID) | RESPIRATORY_TRACT | 0 refills | Status: DC | PRN

## 2019-03-10 MED ORDER — IBUPROFEN 600 MG PO TABS: 600.0000 mg | ORAL_TABLET | Freq: Three times a day (TID) | ORAL | 0 refills | Status: DC | PRN

## 2019-03-10 NOTE — Progress Notes (Signed)
Name: Cathy Gray   MRN: [id number]    DOB: 1966-10-19   Date:03/10/2019       Progress Note  Subjective  Chief Complaint  Chief Complaint  Patient presents with  . Soreness on Face    Sunday monring, hurts to touch on the entire left side of her face. Denies any fever, chills, redness.   . Facial Swelling    Just on the left side and feels like someone punched her in her nose. Only thing different is her and husband went on a hike in their backyard.     I connected with  Clabe Seal  on 03/10/19 at 10:20 AM EDT by a video enabled telemedicine application and verified that I am speaking with the correct person using two identifiers.  I discussed the limitations of evaluation and management by telemedicine and the availability of in person appointments. The patient expressed understanding and agreed to proceed. Staff also discussed with the patient that there may be a patient responsible charge related to this service. Patient Location: at work  Provider Location: [facility]   HPI  Plantar fasciitis left : she is seeing Dr. Prudence Davidson and planing on surgery repair, she is wearing a ortho shoe. She is taking ibuprofen prn,  but the boot seems to help with pain and swelling.   AR: she states symptoms are under control, no nasal congestion, rhinorrhea or sneezing. Her eyes are puffy   Sinusitis: she states she woke up yesterday with swelling and pain on left side of nose and puffy, no redness, no fever or chills. She denies trauma. She wears glasses but did not sleep with it.   Asthma: she no wheezing, sob or cough. She is back on Advair 250/50 daily    Patient Active Problem List   Diagnosis Date Noted  . Plantar fasciitis of left foot 09/30/2018  . Bulging lumbar disc 05/14/2018  . Hypercholesteremia 04/18/2018  . Hyperglycemia 04/18/2018  . Vitamin D deficiency 04/18/2018  . B12 deficiency 04/18/2018  . History of recent fall 09/10/2017  . Obesity (BMI 30.0-34.9)  05/10/2017  . Asthma, moderate 07/30/2015  . Bilateral cataracts 07/30/2015  . Neuralgia neuritis, sciatic nerve 07/30/2015  . Recurrent UTI 07/30/2015    Past Surgical History:  Procedure Laterality Date  . ABDOMINAL HYSTERECTOMY    . BLADDER SUSPENSION  2007  . CESAREAN SECTION  2001  . COLONOSCOPY WITH PROPOFOL N/A 08/13/2017   Procedure: COLONOSCOPY WITH PROPOFOL;  Surgeon: Jonathon Bellows, MD;  Location: [facility] ENDOSCOPY;  Service: Gastroenterology;  Laterality: N/A;  . renal abscess  1994    Family History  Problem Relation Age of Onset  . Heart disease Father   . Asthma Father   . Diverticulosis Father   . Cancer Sister        Thyroid  . Anxiety disorder Daughter   . OCD Daughter   . Hyperlipidemia Maternal Grandmother   . Diverticulosis Maternal Grandmother   . Diabetes Maternal Grandfather   . Asthma Paternal Grandmother   . Hyperlipidemia Paternal Grandmother   . Seizures Son   . Breast cancer Cousin 32       pat 1st     Social History   Socioeconomic History  . Marital status: Married    Spouse name: Arva Chafe  . Number of children: 3  . Years of education: Not on file  . Highest education level: Some college, no degree  Occupational History  . Not on file  Social Needs  .  Financial resource strain: Not hard at all  . Food insecurity:    Worry: Never true    Inability: Never true  . Transportation needs:    Medical: No    Non-medical: No  Tobacco Use  . Smoking status: Never Smoker  . Smokeless tobacco: Never Used  Substance and Sexual Activity  . Alcohol use: Yes    Alcohol/week: 0.0 standard drinks    Comment: occasionally  . Drug use: No  . Sexual activity: Yes    Partners: Male    Birth control/protection: Other-see comments    Comment: Hysterectomy  Lifestyle  . Physical activity:    Days per week: 6 days    Minutes per session: 150+ min  . Stress: Not at all  Relationships  . Social connections:    Talks on phone: More than three times a  week    Gets together: More than three times a week    Attends religious service: More than 4 times per year    Active member of club or organization: No    Attends meetings of clubs or organizations: Never    Relationship status: Married  . Intimate partner violence:    Fear of current or ex partner: No    Emotionally abused: No    Physically abused: No    Forced sexual activity: No  Other Topics Concern  . 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     Current Outpatient Medications:  .  albuterol (PROVENTIL HFA;VENTOLIN HFA) 108 (90 Base) MCG/ACT inhaler, Inhale 2 puffs into the lungs every 6 (six) hours as needed for wheezing or shortness of breath., Disp: 1 Inhaler, Rfl: 0 .  Aspirin-Calcium Carbonate 81-777 MG TABS, Take by mouth., Disp: , Rfl:  .  cyclobenzaprine (FLEXERIL) 5 MG tablet, Take 1 tablet (5 mg total) by mouth 3 (three) times daily as needed., Disp: 90 tablet, Rfl: 0 .  dicyclomine (BENTYL) 10 MG capsule, Take 1 capsule (10 mg total) by mouth 3 (three) times daily before meals., Disp: 21 capsule, Rfl: 0 .  meloxicam (MOBIC) 15 MG tablet, Take 1 tablet (15 mg total) by mouth daily., Disp: 30 tablet, Rfl: 2 .  ondansetron (ZOFRAN-ODT) 4 MG disintegrating tablet, Take 1 tablet (4 mg total) by mouth every 8 (eight) hours as needed for nausea or vomiting., Disp: 20 tablet, Rfl: 0 .  Pediatric Multivit-Minerals-C (GUMMI BEAR MULTIVITAMIN/MIN) CHEW, Chew 2 tablets by mouth daily., Disp: , Rfl:  .  triamcinolone cream (KENALOG) 0.1 %, , Disp: , Rfl:  .  WIXELA INHUB 250-50 MCG/DOSE AEPB, Inhale 1 puff into the lungs 2 (two) times daily., Disp: 60 each, Rfl: 5  Allergies  Allergen Reactions  . Nitrofurantoin Anaphylaxis  . Amoxicillin     rash  . Gabapentin   . Molds & Smuts   . Montelukast Sodium     tachycardia  . Nitrofurantoin Monohyd Macro Diarrhea, Itching and Nausea Only    Other reaction(s):  Abdominal pain, Dizziness, Unconsciousness  . Penicillin V Potassium Other (See Comments)  . Pollen Extract     I personally reviewed active problem list, medication list, allergies, family history with the patient/caregiver today.   ROS  Ten systems reviewed and is negative except as mentioned in HPI   Objective  Virtual encounter, vitals not obtained.  Body mass index is 31.76 kg/m.  Physical Exam  Awake, alert and oriented No erythema on  face, left side of face sore to touch and swollen, normal eye movements, eye lids slightly puffy bilaterally  PHQ2/9: Depression screen [facility] 2/9 03/10/2019 01/06/2019 09/30/2018 06/08/2017 10/04/2016  Decreased Interest 0 0 0 0 0  Down, Depressed, Hopeless 0 0 0 0 0  PHQ - 2 Score 0 0 0 0 0  Altered sleeping 0 - - - -  Tired, decreased energy 0 - - - -  Change in appetite 0 - - - -  Feeling bad or failure about yourself  0 - - - -  Trouble concentrating 0 - - - -  Moving slowly or fidgety/restless 0 - - - -  Suicidal thoughts 0 - - - -  PHQ-9 Score 0 - - - -  Difficult doing work/chores Not difficult at all - - - -   PHQ-2/9 Result is negative.    Fall Risk: Fall Risk  03/10/2019 01/06/2019 09/30/2018 05/14/2018 03/11/2018  Falls in the past year? 0 0 0 No No  Number falls in past yr: 0 0 0 - -  Injury with Fall? 0 0 - - -     Assessment & Plan  1. Moderate persistent asthma without complication  - albuterol (VENTOLIN HFA) 108 (90 Base) MCG/ACT inhaler; Inhale 2 puffs into the lungs every 6 (six) hours as needed for wheezing or shortness of breath.  Dispense: 1 Inhaler; Refill: 0  2. Acute non-recurrent maxillary sinusitis  Discussed to monitor for eye movements and redness, it may be the beginning of cellulitis and I will need to be notified if no improvement - levocetirizine (XYZAL) 5 MG tablet; Take 1 tablet (5 mg total) by mouth every evening.  Dispense: 30 tablet; Refill: 0 - doxycycline (VIBRA-TABS) 100 MG tablet; Take 1 tablet  (100 mg total) by mouth 2 (two) times daily.  Dispense: 20 tablet; Refill: 0  3. Plantar fasciitis of left foot  - ibuprofen (ADVIL) 600 MG tablet; Take 1 tablet (600 mg total) by mouth every 8 (eight) hours as needed.  Dispense: 60 tablet; Refill: 0  I discussed the assessment and treatment plan with the patient. The patient was provided an opportunity to ask questions and all were answered. The patient agreed with the plan and demonstrated an understanding of the instructions.  The patient was advised to call back or seek an in-person evaluation if the symptoms worsen or if the condition fails to improve as anticipated.  I provided 25  minutes of non-face-to-face time during this encounter.

## 2019-03-28 ENCOUNTER — Other Ambulatory Visit: Payer: Self-pay | Admitting: Family Medicine

## 2019-03-28 DIAGNOSIS — J4540 Moderate persistent asthma, uncomplicated: Secondary | ICD-10-CM

## 2019-03-28 NOTE — Telephone Encounter (Signed)
Refill request for general medication. Albuterol to [facility]  Last office visit 03/10/2019   Follow up on 04/02/2019

## 2019-04-01 ENCOUNTER — Other Ambulatory Visit: Payer: Self-pay | Admitting: Family Medicine

## 2019-04-01 DIAGNOSIS — J0100 Acute maxillary sinusitis, unspecified: Secondary | ICD-10-CM

## 2019-04-02 ENCOUNTER — Ambulatory Visit: Payer: BC Managed Care – PPO | Admitting: Family Medicine

## 2019-04-11 ENCOUNTER — Other Ambulatory Visit: Payer: Self-pay

## 2019-04-11 ENCOUNTER — Encounter: Payer: Self-pay | Admitting: Family Medicine

## 2019-04-11 ENCOUNTER — Ambulatory Visit: Payer: BC Managed Care – PPO | Admitting: Family Medicine

## 2019-04-11 VITALS — BP 120/80 | HR 88 | Temp 97.9°F | Resp 16 | Ht 63.0 in | Wt 183.1 lb

## 2019-04-11 DIAGNOSIS — M5126 Other intervertebral disc displacement, lumbar region: Secondary | ICD-10-CM | POA: Diagnosis not present

## 2019-04-11 DIAGNOSIS — E785 Hyperlipidemia, unspecified: Secondary | ICD-10-CM

## 2019-04-11 DIAGNOSIS — M5442 Lumbago with sciatica, left side: Secondary | ICD-10-CM

## 2019-04-11 DIAGNOSIS — M5136 Other intervertebral disc degeneration, lumbar region: Secondary | ICD-10-CM

## 2019-04-11 DIAGNOSIS — G8929 Other chronic pain: Secondary | ICD-10-CM

## 2019-04-11 DIAGNOSIS — E669 Obesity, unspecified: Secondary | ICD-10-CM | POA: Diagnosis not present

## 2019-04-11 DIAGNOSIS — J4540 Moderate persistent asthma, uncomplicated: Secondary | ICD-10-CM

## 2019-04-11 MED ORDER — TRAMADOL HCL 50 MG PO TABS: 50.0000 mg | ORAL_TABLET | Freq: Three times a day (TID) | ORAL | 0 refills | Status: AC | PRN

## 2019-04-11 MED ORDER — MELOXICAM 15 MG PO TABS: 15.0000 mg | ORAL_TABLET | Freq: Every day | ORAL | 0 refills | Status: DC

## 2019-04-11 MED ORDER — CYCLOBENZAPRINE HCL 5 MG PO TABS: 5.0000 mg | ORAL_TABLET | Freq: Three times a day (TID) | ORAL | 0 refills | Status: DC | PRN

## 2019-04-11 NOTE — Progress Notes (Signed)
Name: Cathy Gray   MRN: [id number]    DOB: 10/24/1966   Date:04/11/2019       Progress Note  Subjective  Chief Complaint  Chief Complaint  Patient presents with  . Asthma  . 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 She states she was doing well, able to walk and travelled to [place], however she came back this weekend and went to work on Tuesday and picked up a heavy box and had acute onset of flare of symptoms. She states she went back to chiropractor yesterday and had manipulation it felt better initially but is in a lot of pain right now, pain is 8/10, it is shooting down left leg.   Asthma Moderate: she is using Advair but only once at night, she denies wheezing, cough or SOB. No nocturnal symptoms. She has been doing well. She states this past Spring her allergies were worse but moved to a [place] acres property and has been outside more, but not now.   Obesity: She has gained weight in the past 6 months, unable to exercise much secondary to back pain and COVID-19, she is trying to avoid junk food  Dyslipidemia: not on medication, discussed life style modification    Patient Active Problem List   Diagnosis Date Noted  . Plantar fasciitis of left foot 09/30/2018  . Bulging lumbar disc 05/14/2018  . Hypercholesteremia 04/18/2018  . Hyperglycemia 04/18/2018  . Vitamin D deficiency 04/18/2018  . B12 deficiency 04/18/2018  . History of recent fall 09/10/2017  . Obesity (BMI 30.0-34.9) 05/10/2017  . Asthma, moderate 07/30/2015  . Bilateral cataracts 07/30/2015  . Neuralgia neuritis, sciatic nerve 07/30/2015  . Recurrent UTI 07/30/2015    Past Surgical History:  Procedure  Laterality Date  . ABDOMINAL HYSTERECTOMY    . BLADDER SUSPENSION  2007  . CESAREAN SECTION  2001  . COLONOSCOPY WITH PROPOFOL N/A 08/13/2017   Procedure: COLONOSCOPY WITH PROPOFOL;  Surgeon: Jonathon Bellows, MD;  Location: [facility] ENDOSCOPY;  Service: Gastroenterology;  Laterality: N/A;  . renal abscess  1994    Family History  Problem Relation Age of Onset  . Heart disease Father   . Asthma Father   . Diverticulosis Father   . Cancer Sister        Thyroid  . Anxiety disorder Daughter   . OCD Daughter   . Hyperlipidemia Maternal Grandmother   . Diverticulosis Maternal Grandmother   . Diabetes Maternal Grandfather   . Asthma Paternal Grandmother   . Hyperlipidemia Paternal Grandmother   . Seizures Son   . Breast cancer Cousin 32       pat 1st     Social History   Socioeconomic History  . Marital status: Married    Spouse name: Arva Chafe  . Number of children: 3  . Years of education: Not on file  . Highest education level: Some college, no degree  Occupational History  . Not on file  Social Needs  . Financial resource strain: Not hard at all  . Food insecurity:    Worry: Never true    Inability: Never true  . Transportation needs:    Medical: No    Non-medical: No  Tobacco Use  . Smoking status: Never Smoker  . Smokeless tobacco: Never Used  Substance and Sexual Activity  . Alcohol use: Yes    Alcohol/week: 0.0 standard drinks    Comment: occasionally  . Drug use: No  . Sexual activity: Yes    Partners: Male    Birth control/protection: Other-see comments    Comment: Hysterectomy  Lifestyle  . Physical activity:    Days per week: 6 days    Minutes per session: 150+ min  . Stress: Not at all  Relationships  . Social connections:    Talks on phone: More than three times a week    Gets together: More than three times a week    Attends religious service: More than 4 times per year    Active member of club or organization: No    Attends meetings of clubs or  organizations: Never    Relationship status: Married  . Intimate partner violence:    Fear of current or ex partner: No    Emotionally abused: No    Physically abused: No    Forced sexual activity: No  Other Topics Concern  . 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     Current Outpatient Medications:  .  albuterol (VENTOLIN HFA) 108 (90 Base) MCG/ACT inhaler, TAKE 2 PUFFS BY MOUTH EVERY 6 HOURS AS NEEDED FOR WHEEZE OR SHORTNESS OF BREATH, Disp: 6.7 Inhaler, Rfl: 0 .  Aspirin-Calcium Carbonate 81-777 MG TABS, Take by mouth., Disp: , Rfl:  .  cyclobenzaprine (FLEXERIL) 5 MG tablet, Take 1 tablet (5 mg total) by mouth 3 (three) times daily as needed., Disp: 90 tablet, Rfl: 0 .  dicyclomine (BENTYL) 10 MG capsule, Take 1 capsule (10 mg total) by mouth 3 (three) times daily before meals., Disp: 21 capsule, Rfl: 0 .  Fluticasone-Salmeterol (ADVAIR) 250-50 MCG/DOSE AEPB, Inhale 1 puff into the lungs daily., Disp: , Rfl:  .  ibuprofen (ADVIL) 600 MG tablet, Take 1 tablet (600 mg total) by mouth every 8 (eight) hours as needed., Disp: 60 tablet, Rfl: 0 .  levocetirizine (XYZAL) 5 MG tablet, TAKE 1 TABLET BY MOUTH EVERY DAY IN THE EVENING, Disp: 30 tablet, Rfl: 5 .  Pediatric Multivit-Minerals-C (GUMMI BEAR MULTIVITAMIN/MIN) CHEW, Chew 2 tablets by mouth daily., Disp: , Rfl:  .  doxycycline (VIBRA-TABS) 100 MG tablet, Take 1 tablet (100 mg total) by mouth 2 (two) times daily. (Patient not taking: Reported on 04/11/2019), Disp: 20 tablet, Rfl: 0 .  triamcinolone cream (KENALOG) 0.1 %, , Disp: , Rfl:  .  WIXELA INHUB 250-50 MCG/DOSE AEPB, Inhale 1 puff into the lungs 2 (two) times daily. (Patient not taking: Reported on 04/11/2019), Disp: 60 each, Rfl: 5  Allergies  Allergen Reactions  . Nitrofurantoin Anaphylaxis  . Amoxicillin     rash  . Gabapentin   . Molds & Smuts   . Montelukast Sodium     tachycardia  . Nitrofurantoin Monohyd  Macro Diarrhea, Itching and Nausea Only    Other reaction(s): Abdominal pain, Dizziness, Unconsciousness  . Penicillin V Potassium Other (See Comments)  . Pollen Extract     I personally reviewed active problem list, medication list, allergies, family history, social history with the patient/caregiver today.   ROS  Ten systems reviewed and is negative except as mentioned in HPI   Objective  Vitals:   04/11/19 1055  BP: 120/80  Pulse: 88  Resp: 16  Temp: 97.9 F (36.6 C)  TempSrc: Oral  SpO2: 98%  Weight: 183 lb 1.6 oz (83.1 kg)  Height: 5\' 3"  (1.6 m)    Body mass index is 32.43 kg/m.  Physical Exam  Constitutional: Patient appears well-developed and well-nourished. Obese No distress.  HEENT: head atraumatic, normocephalic, pupils equal and reactive to light,  neck supple, oral mucosa not done  Cardiovascular: Normal rate, regular rhythm and normal heart sounds.  No murmur heard. No BLE edema. Pulmonary/Chest: Effort normal and breath sounds normal. No respiratory distress. Abdominal: Soft.  There is no tenderness. Psychiatric: Patient has a normal mood and affect. behavior is normal. Judgment and thought content normal. Muscular skeletal: tender to touch on lumbar spine, negative straight leg raise   PHQ2/9: Depression screen [facility] 2/9 04/11/2019 03/10/2019 01/06/2019 09/30/2018 06/08/2017  Decreased Interest 0 0 0 0 0  Down, Depressed, Hopeless 0 0 0 0 0  PHQ - 2 Score 0 0 0 0 0  Altered sleeping 0 0 - - -  Tired, decreased energy 0 0 - - -  Change in appetite 0 0 - - -  Feeling bad or failure about yourself  0 0 - - -  Trouble concentrating 0 0 - - -  Moving slowly or fidgety/restless 0 0 - - -  Suicidal thoughts 0 0 - - -  PHQ-9 Score 0 0 - - -  Difficult doing work/chores - Not difficult at all - - -    phq 9 is negative   Fall Risk: Fall Risk  04/11/2019 03/10/2019 01/06/2019 09/30/2018 05/14/2018  Falls in the past year? 0 0 0 0 No  Number falls in past yr: 0 0 0 0 -   Injury with Fall? 0 0 0 - -     Functional Status Survey: Is the patient deaf or have difficulty hearing?: No Does the patient have difficulty seeing, even when wearing glasses/contacts?: No Does the patient have difficulty concentrating, remembering, or making decisions?: No Does the patient have difficulty walking or climbing stairs?: Yes Does the patient have difficulty dressing or bathing?: No Does the patient have difficulty doing errands alone such as visiting a doctor's office or shopping?: No    Assessment & Plan  1. Dyslipidemia   2. Obesity (BMI 30.0-34.9)  Discussed with the patient the risk posed by an increased BMI. Discussed importance of portion control, calorie counting and at least 150 minutes of physical activity weekly. Avoid sweet beverages and drink more water. Eat at least 6 servings of fruit and vegetables daily   3. Bulging lumbar disc   4. Chronic left-sided low back pain with left-sided sciatica  - cyclobenzaprine (FLEXERIL) 5 MG tablet; Take 1 tablet (5 mg total) by mouth 3 (three) times daily as needed.  Dispense: 90 tablet; Refill: 0 - meloxicam (MOBIC) 15 MG tablet; Take 1 tablet (15 mg total) by mouth daily.  Dispense: 90 tablet; Refill: 0 - traMADol (ULTRAM) 50 MG tablet; Take 1 tablet (50 mg total) by mouth every 8 (eight) hours as needed for up to 5 days.  Dispense: 60 tablet; Refill: 0  5. Moderate persistent asthma without complication  Continue medication

## 2019-04-11 NOTE — Patient Instructions (Signed)
Take tylenol 500 mg with tramadol three times daily  meloxicam once daily Flexeril up to 3 times a day

## 2019-06-11 ENCOUNTER — Ambulatory Visit: Payer: BC Managed Care – PPO | Admitting: Family Medicine

## 2019-07-02 ENCOUNTER — Ambulatory Visit: Payer: BC Managed Care – PPO | Admitting: Family Medicine

## 2019-07-02 ENCOUNTER — Encounter: Payer: Self-pay | Admitting: Family Medicine

## 2019-07-02 ENCOUNTER — Other Ambulatory Visit: Payer: Self-pay

## 2019-07-02 VITALS — BP 126/72 | HR 94 | Temp 97.3°F | Resp 16 | Ht 63.0 in | Wt 188.3 lb

## 2019-07-02 DIAGNOSIS — Z131 Encounter for screening for diabetes mellitus: Secondary | ICD-10-CM

## 2019-07-02 DIAGNOSIS — M545 Low back pain, unspecified: Secondary | ICD-10-CM

## 2019-07-02 DIAGNOSIS — E669 Obesity, unspecified: Secondary | ICD-10-CM

## 2019-07-02 DIAGNOSIS — R232 Flushing: Secondary | ICD-10-CM

## 2019-07-02 DIAGNOSIS — E66811 Obesity, class 1: Secondary | ICD-10-CM

## 2019-07-02 DIAGNOSIS — J4540 Moderate persistent asthma, uncomplicated: Secondary | ICD-10-CM

## 2019-07-02 DIAGNOSIS — E785 Hyperlipidemia, unspecified: Secondary | ICD-10-CM

## 2019-07-02 DIAGNOSIS — E538 Deficiency of other specified B group vitamins: Secondary | ICD-10-CM

## 2019-07-02 DIAGNOSIS — R61 Generalized hyperhidrosis: Secondary | ICD-10-CM

## 2019-07-02 DIAGNOSIS — Z23 Encounter for immunization: Secondary | ICD-10-CM

## 2019-07-02 NOTE — Progress Notes (Signed)
Name: Cathy Gray   MRN: [id number]    DOB: [birth year]   Date:07/02/2019       Progress Note  Subjective  Chief Complaint  Chief Complaint  Patient presents with  . Hot Flashes    Feels like she is combust from the inside  . Headache  . 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 Excessive Sweating    At night  . Sore Throat    Feels like something is stuck to her throat    HPI  Hot Flashes: she states she had a hysterectomy but recently she has noticed some fullness on her throat, night sweats and hot flashes , her face gets flushed  , gaining weight. She is worried about menopause. She states at times she feels like she is burning from her neck up. Discussed treatment of post-menopause, she states she does not like taking medication, just wants to know what is causing her problems  Tinnitus both ears: she states occasionally waking up with mild headache behind left eye, tinnitus has been going on for the past couple of weeks, no ear fullness or hearing loss. No double vision or weakness or tingling sensation. She states intermittent, she states usually notices when she wakes up  Dyslipidemia: discussed previous labs and we will recheck it today  B12: recheck level, not taking supplementation at this time  Moderate asthma: she is taking Advair once a day, states currently no coughing , wheezing or SOB. She has not used rescue inhaler recently   Obesity: she states she has been trying to eat healthier and has been active at her property, but continues to gain weight. She is getting very upset. Discussed carbohydrate restrictive diet and she would like to try it. Explained the need to increase water intake, about 90 g of protein per day and cut down on carb to 20 g for the first 2 weeks followed by about 50 g of carbohydrates per day after that.   Patient Active Problem List   Diagnosis Date  Noted  . Plantar fasciitis of left foot 09/30/2018  . Bulging lumbar disc 05/14/2018  . Hypercholesteremia 04/18/2018  . Hyperglycemia 04/18/2018  . Vitamin D deficiency 04/18/2018  . B12 deficiency 04/18/2018  . History of recent fall 09/10/2017  . Obesity (BMI 30.0-34.9) 05/10/2017  . Asthma, moderate 07/30/2015  . Bilateral cataracts 07/30/2015  . Neuralgia neuritis, sciatic nerve 07/30/2015  . Recurrent UTI 07/30/2015    Past Surgical History:  Procedure Laterality Date  . ABDOMINAL HYSTERECTOMY    . BLADDER SUSPENSION  2007  . CESAREAN SECTION  2001  . COLONOSCOPY WITH PROPOFOL N/A 08/13/2017   Procedure: COLONOSCOPY WITH PROPOFOL;  Surgeon: Jonathon Bellows, MD;  Location: [facility] ENDOSCOPY;  Service: Gastroenterology;  Laterality: N/A;  . renal abscess  1994    Family History  Problem Relation Age of Onset  . Heart disease Father   . Asthma Father   . Diverticulosis Father   . Cancer Sister        Thyroid  . Anxiety disorder Daughter   . OCD Daughter   . Hyperlipidemia Maternal Grandmother   . Diverticulosis Maternal Grandmother   . Diabetes Maternal Grandfather   . Asthma Paternal Grandmother   . Hyperlipidemia Paternal Grandmother   . Seizures Son   . Breast cancer Cousin 32       pat 1st  Social History   Socioeconomic History  . Marital status: Married    Spouse name: Arva Chafe  . Number of children: 3  . Years of education: Not on file  . Highest education level: Some college, no degree  Occupational History  . Not on file  Social Needs  . Financial resource strain: Not hard at all  . Food insecurity    Worry: Never true    Inability: Never true  . Transportation needs    Medical: No    Non-medical: No  Tobacco Use  . Smoking status: Never Smoker  . Smokeless tobacco: Never Used  Substance and Sexual Activity  . Alcohol use: Yes    Alcohol/week: 0.0 standard drinks    Comment: occasionally  . Drug use: No  . Sexual activity: Yes    Partners:  Male    Birth control/protection: Other-see comments    Comment: Hysterectomy  Lifestyle  . Physical activity    Days per week: 6 days    Minutes per session: 150+ min  . Stress: Not at all  Relationships  . Social connections    Talks on phone: More than three times a week    Gets together: More than three times a week    Attends religious service: More than 4 times per year    Active member of club or organization: No    Attends meetings of clubs or organizations: Never    Relationship status: Married  . Intimate partner violence    Fear of current or ex partner: No    Emotionally abused: No    Physically abused: No    Forced sexual activity: No  Other Topics Concern  . 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     Current Outpatient Medications:  .  albuterol (VENTOLIN HFA) 108 (90 Base) MCG/ACT inhaler, TAKE 2 PUFFS BY MOUTH EVERY 6 HOURS AS NEEDED FOR WHEEZE OR SHORTNESS OF BREATH, Disp: 6.7 Inhaler, Rfl: 0 .  Aspirin-Calcium Carbonate 81-777 MG TABS, Take by mouth., Disp: , Rfl:  .  cyclobenzaprine (FLEXERIL) 5 MG tablet, Take 1 tablet (5 mg total) by mouth 3 (three) times daily as needed., Disp: 90 tablet, Rfl: 0 .  Fluticasone-Salmeterol (ADVAIR) 250-50 MCG/DOSE AEPB, Inhale 1 puff into the lungs daily., Disp: , Rfl:  .  levocetirizine (XYZAL) 5 MG tablet, TAKE 1 TABLET BY MOUTH EVERY DAY IN THE EVENING, Disp: 30 tablet, Rfl: 5 .  meloxicam (MOBIC) 15 MG tablet, Take 1 tablet (15 mg total) by mouth daily., Disp: 90 tablet, Rfl: 0 .  Pediatric Multivit-Minerals-C (GUMMI BEAR MULTIVITAMIN/MIN) CHEW, Chew 2 tablets by mouth daily., Disp: , Rfl:  .  triamcinolone cream (KENALOG) 0.1 %, , Disp: , Rfl:  .  dicyclomine (BENTYL) 10 MG capsule, Take 1 capsule (10 mg total) by mouth 3 (three) times daily before meals. (Patient not taking: Reported on 07/02/2019), Disp: 21 capsule, Rfl: 0  Allergies  Allergen Reactions   . Nitrofurantoin Anaphylaxis  . Amoxicillin     rash  . Gabapentin   . Molds & Smuts   . Montelukast Sodium     tachycardia  . Nitrofurantoin Monohyd Macro Diarrhea, Itching and Nausea Only    Other reaction(s): Abdominal pain, Dizziness, Unconsciousness  . Penicillin V Potassium Other (See Comments)  . Pollen Extract     I personally reviewed active problem list, medication list, allergies, family history, social history with  the patient/caregiver today.   ROS  Ten systems reviewed and is negative except as mentioned in HPI   Objective  Vitals:   07/02/19 1106  BP: 126/72  Pulse: 94  Resp: 16  Temp: (!) 97.3 F (36.3 C)  TempSrc: Temporal  SpO2: 96%  Weight: 188 lb 4.8 oz (85.4 kg)  Height: 5\' 3"  (1.6 m)    Body mass index is 33.36 kg/m.  Physical Exam  Constitutional: Patient appears well-developed and well-nourished. Obese  No distress.  HEENT: head atraumatic, normocephalic, pupils equal and reactive to light, ears normal TM bilaterally , neck supple, throat within normal limits, no thyromegaly  Cardiovascular: Normal rate, regular rhythm and normal heart sounds.  No murmur heard. No BLE edema. Pulmonary/Chest: Effort normal and breath sounds normal. No respiratory distress. Abdominal: Soft.  There is no tenderness. Psychiatric: Patient has a normal mood and affect. behavior is normal. Judgment and thought content normal.   PHQ2/9: Depression screen [facility] 2/9 07/02/2019 04/11/2019 03/10/2019 01/06/2019 09/30/2018  Decreased Interest 0 0 0 0 0  Down, Depressed, Hopeless 0 0 0 0 0  PHQ - 2 Score 0 0 0 0 0  Altered sleeping 0 0 0 - -  Tired, decreased energy 1 0 0 - -  Change in appetite 0 0 0 - -  Feeling bad or failure about yourself  0 0 0 - -  Trouble concentrating 0 0 0 - -  Moving slowly or fidgety/restless 0 0 0 - -  Suicidal thoughts 0 0 0 - -  PHQ-9 Score 1 0 0 - -  Difficult doing work/chores Not difficult at all - Not difficult at all - -    phq 9  is negative   Fall Risk: Fall Risk  07/02/2019 04/11/2019 03/10/2019 01/06/2019 09/30/2018  Falls in the past year? 0 0 0 0 0  Number falls in past yr: 0 0 0 0 0  Injury with Fall? 0 0 0 0 -     Functional Status Survey: Is the patient deaf or have difficulty hearing?: No Does the patient have difficulty seeing, even when wearing glasses/contacts?: Yes Does the patient have difficulty concentrating, remembering, or making decisions?: No Does the patient have difficulty walking or climbing stairs?: No Does the patient have difficulty dressing or bathing?: No Does the patient have difficulty doing errands alone such as visiting a doctor's office or shopping?: No    Assessment & Plan   1. Moderate persistent asthma without complication  Continue Advair   2. Dyslipidemia  - Lipid panel  3. B12 deficiency  Advised to resume supplementation   4. Diabetes mellitus screening  - Hemoglobin A1c  5. Intermittent low back pain   6. Hot flashes  - Thyroid Panel With TSH - COMPLETE METABOLIC PANEL WITH GFR - FSH/LH - CBC with Differential/Platelet  7. Night sweats  - Thyroid Panel With TSH - COMPLETE METABOLIC PANEL WITH GFR - FSH/LH - CBC with Differential/Platelet  8. Needs flu shot  - Flu Vaccine MDCK QUAD PF  9. Obesity (BMI 30.0-34.9)  See HPI

## 2019-07-02 NOTE — Patient Instructions (Signed)
Keto diet, 20 g carbs for two weeks after that try to stay around 50 g of carbs per day

## 2019-07-03 ENCOUNTER — Other Ambulatory Visit: Payer: Self-pay | Admitting: Family Medicine

## 2019-07-03 DIAGNOSIS — G8929 Other chronic pain: Secondary | ICD-10-CM

## 2019-07-03 DIAGNOSIS — M5442 Lumbago with sciatica, left side: Secondary | ICD-10-CM

## 2019-07-03 LAB — HEMOGLOBIN A1C
Hgb A1c MFr Bld: 5.4 % of total Hgb (ref <5.7)
Mean Plasma Glucose: 108 (calc)
eAG (mmol/L): 6 (calc)

## 2019-07-03 LAB — VITAMIN B12: Vitamin B-12: 530 pg/mL (ref 200–1100)

## 2019-07-03 LAB — CBC WITH DIFFERENTIAL/PLATELET
Absolute Monocytes: 470 cells/uL (ref 200–950)
Basophils Absolute: 62 cells/uL (ref 0–200)
Basophils Relative: 0.8 %
Eosinophils Absolute: 69 cells/uL (ref 15–500)
Eosinophils Relative: 0.9 %
HCT: 37.6 % (ref 35.0–45.0)
Hemoglobin: 12.7 g/dL (ref 11.7–15.5)
Lymphs Abs: 2287 cells/uL (ref 850–3900)
MCH: 32.3 pg (ref 27.0–33.0)
MCHC: 33.8 g/dL (ref 32.0–36.0)
MCV: 95.7 fL (ref 80.0–100.0)
MPV: 9.7 fL (ref 7.5–12.5)
Monocytes Relative: 6.1 %
Neutro Abs: 4813 cells/uL (ref 1500–7800)
Neutrophils Relative %: 62.5 %
Platelets: 301 10*3/uL (ref 140–400)
RBC: 3.93 10*6/uL (ref 3.80–5.10)
RDW: 11.7 % (ref 11.0–15.0)
Total Lymphocyte: 29.7 %
WBC: 7.7 10*3/uL (ref 3.8–10.8)

## 2019-07-03 LAB — COMPLETE METABOLIC PANEL WITH GFR
AG Ratio: 1.8 (calc) (ref 1.0–2.5)
ALT: 17 U/L (ref 6–29)
AST: 17 U/L (ref 10–35)
Albumin: 4.4 g/dL (ref 3.6–5.1)
Alkaline phosphatase (APISO): 55 U/L (ref 37–153)
BUN: 14 mg/dL (ref 7–25)
CO2: 30 mmol/L (ref 20–32)
Calcium: 9.6 mg/dL (ref 8.6–10.4)
Chloride: 102 mmol/L (ref 98–110)
Creat: 0.79 mg/dL (ref 0.50–1.05)
GFR, Est African American: 100 mL/min/{1.73_m2} (ref >=60)
GFR, Est Non African American: 86 mL/min/{1.73_m2} (ref >=60)
Globulin: 2.5 g/dL (calc) (ref 1.9–3.7)
Glucose, Bld: 87 mg/dL (ref 65–99)
Potassium: 4.6 mmol/L (ref 3.5–5.3)
Sodium: 139 mmol/L (ref 135–146)
Total Bilirubin: 0.3 mg/dL (ref 0.2–1.2)
Total Protein: 6.9 g/dL (ref 6.1–8.1)

## 2019-07-03 LAB — LIPID PANEL
Cholesterol: 226 mg/dL — ABNORMAL HIGH (ref <200)
HDL: 64 mg/dL (ref >=50)
LDL Cholesterol (Calc): 132 mg/dL (calc) — ABNORMAL HIGH
Non-HDL Cholesterol (Calc): 162 mg/dL (calc) — ABNORMAL HIGH (ref <130)
Total CHOL/HDL Ratio: 3.5 (calc) (ref <5.0)
Triglycerides: 166 mg/dL — ABNORMAL HIGH (ref <150)

## 2019-07-03 LAB — FSH/LH
FSH: 16 m[IU]/mL
LH: 9.1 m[IU]/mL

## 2019-07-03 LAB — THYROID PANEL WITH TSH
Free Thyroxine Index: 2.4 (ref 1.4–3.8)
T3 Uptake: 26 % (ref 22–35)
T4, Total: 9.2 ug/dL (ref 5.1–11.9)
TSH: 1.33 mIU/L

## 2019-09-09 ENCOUNTER — Other Ambulatory Visit: Payer: Self-pay | Admitting: Family Medicine

## 2019-09-09 NOTE — Telephone Encounter (Signed)
Requested medication (s) are due for refill today: yes  Requested medication (s) are on the active medication list: yes  Last refill:  01/12/2019  Future visit scheduled: yes  Notes to clinic:  Last filled by historical provider Review for refill   Requested Prescriptions  Pending Prescriptions Disp Refills   ADVAIR DISKUS 250-50 MCG/DOSE AEPB [Pharmacy Med Name: ADVAIR 250-50 DISKUS] 60 each 5    Sig: TAKE 1 PUFF BY MOUTH TWICE A DAY     Pulmonology:  Combination Products Passed - 09/09/2019 10:50 AM      Passed - Valid encounter within last 12 months    Recent Outpatient Visits          2 months ago Moderate persistent asthma without complication   [facility] Steele Sizer, MD   5 months ago Dyslipidemia   [facility] Steele Sizer, MD   6 months ago Acute non-recurrent maxillary sinusitis   [facility] Steele Sizer, MD   8 months ago [place], NP   11 months ago Wellness examination   [facility] Steele Sizer, MD      Future Appointments            In 3 weeks Steele Sizer, MD [facility], [place]   In 3 months Steele Sizer, MD [facility], [facility]

## 2019-09-13 DIAGNOSIS — Z8616 Personal history of COVID-19: Secondary | ICD-10-CM | POA: Insufficient documentation

## 2019-09-16 ENCOUNTER — Ambulatory Visit (INDEPENDENT_AMBULATORY_CARE_PROVIDER_SITE_OTHER): Payer: BC Managed Care – PPO | Admitting: Family Medicine

## 2019-09-16 ENCOUNTER — Other Ambulatory Visit: Payer: Self-pay

## 2019-09-16 ENCOUNTER — Encounter: Payer: Self-pay | Admitting: Family Medicine

## 2019-09-16 VITALS — HR 86 | Temp 97.7°F

## 2019-09-16 DIAGNOSIS — U071 COVID-19: Secondary | ICD-10-CM

## 2019-09-16 DIAGNOSIS — J4540 Moderate persistent asthma, uncomplicated: Secondary | ICD-10-CM | POA: Diagnosis not present

## 2019-09-16 MED ORDER — BENZONATATE 100 MG PO CAPS: 100.0000 mg | ORAL_CAPSULE | Freq: Two times a day (BID) | ORAL | 0 refills | Status: DC | PRN

## 2019-09-16 NOTE — Progress Notes (Signed)
Name: Cathy Gray   MRN: [id number]    DOB: 10/23/1966   Date:09/16/2019       Progress Note  Subjective  Chief Complaint  Chief Complaint  Patient presents with  . Covid Symptoms    She tested positive for covid. She is having facial pain, ringing in her ear, and loss of taste. Denies fever, sore throat and cough    I connected with  Clabe Seal  on 09/16/19 at  8:00 AM EST by a video enabled telemedicine application and verified that I am speaking with the correct person using two identifiers.  I discussed the limitations of evaluation and management by telemedicine and the availability of in person appointments. The patient expressed understanding and agreed to proceed. Staff also discussed with the patient that there may be a patient responsible charge related to this service. Patient Location: at home  Provider Location: [facility]   HPI  COVID-19 : she was exposed at work around Tuesday or Wednesday last week, she developed low grade fever followed by fatigue this past weekend, she has a mild sore throat, and dry cough, some facial pressure and lack of taste and smell. She states asthma has been controlled, no wheezing or SOB. She has been taking vitamin D , vitamin C and using Advair daily. She is self isolating in her house Today she developed mild headache. Discussed that symptoms can get worse between day 7-10   Patient Active Problem List   Diagnosis Date Noted  . Plantar fasciitis of left foot 09/30/2018  . Bulging lumbar disc 05/14/2018  . Hypercholesteremia 04/18/2018  . Hyperglycemia 04/18/2018  . Vitamin D deficiency 04/18/2018  . B12 deficiency 04/18/2018  . History of recent fall 09/10/2017  . Obesity (BMI 30.0-34.9) 05/10/2017  . Asthma, moderate 07/30/2015  . Bilateral cataracts 07/30/2015  . Neuralgia neuritis, sciatic nerve 07/30/2015  . Recurrent UTI 07/30/2015    Past Surgical History:  Procedure Laterality Date  . ABDOMINAL  HYSTERECTOMY    . BLADDER SUSPENSION  2007  . CESAREAN SECTION  2001  . COLONOSCOPY WITH PROPOFOL N/A 08/13/2017   Procedure: COLONOSCOPY WITH PROPOFOL;  Surgeon: Jonathon Bellows, MD;  Location: [facility] ENDOSCOPY;  Service: Gastroenterology;  Laterality: N/A;  . renal abscess  1994    Family History  Problem Relation Age of Onset  . Heart disease Father   . Asthma Father   . Diverticulosis Father   . Cancer Sister        Thyroid  . Anxiety disorder Daughter   . OCD Daughter   . Hyperlipidemia Maternal Grandmother   . Diverticulosis Maternal Grandmother   . Diabetes Maternal Grandfather   . Asthma Paternal Grandmother   . Hyperlipidemia Paternal Grandmother   . Seizures Son   . Breast cancer Cousin 32       pat 1st     Social History   Socioeconomic History  . Marital status: Married    Spouse name: Arva Chafe  . Number of children: 3  . Years of education: Not on file  . Highest education level: Some college, no degree  Occupational History  . Not on file  Social Needs  . Financial resource strain: Not hard at all  . Food insecurity    Worry: Never true    Inability: Never true  . Transportation needs    Medical: No    Non-medical: No  Tobacco Use  . Smoking status: Never Smoker  . Smokeless tobacco: Never Used  Substance and  Sexual Activity  . Alcohol use: Yes    Alcohol/week: 0.0 standard drinks    Comment: occasionally  . Drug use: No  . Sexual activity: Yes    Partners: Male    Birth control/protection: Other-see comments    Comment: Hysterectomy  Lifestyle  . Physical activity    Days per week: 6 days    Minutes per session: 150+ min  . Stress: Not at all  Relationships  . Social connections    Talks on phone: More than three times a week    Gets together: More than three times a week    Attends religious service: More than 4 times per year    Active member of club or organization: No    Attends meetings of clubs or organizations: Never    Relationship  status: Married  . Intimate partner violence    Fear of current or ex partner: No    Emotionally abused: No    Physically abused: No    Forced sexual activity: No  Other Topics Concern  . 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     Current Outpatient Medications:  .  ADVAIR DISKUS 250-50 MCG/DOSE AEPB, TAKE 1 PUFF BY MOUTH TWICE A DAY, Disp: 60 each, Rfl: 0 .  albuterol (VENTOLIN HFA) 108 (90 Base) MCG/ACT inhaler, TAKE 2 PUFFS BY MOUTH EVERY 6 HOURS AS NEEDED FOR WHEEZE OR SHORTNESS OF BREATH, Disp: 6.7 Inhaler, Rfl: 0 .  Ascorbic Acid (VITAMIN C) 1000 MG tablet, Take 1,000 mg by mouth daily., Disp: , Rfl:  .  Cholecalciferol (VITAMIN D) 50 MCG (2000 UT) CAPS, Take 2,000 Units by mouth daily., Disp: , Rfl:  .  cyclobenzaprine (FLEXERIL) 5 MG tablet, Take 1 tablet (5 mg total) by mouth 3 (three) times daily as needed., Disp: 90 tablet, Rfl: 0 .  dicyclomine (BENTYL) 10 MG capsule, Take 1 capsule (10 mg total) by mouth 3 (three) times daily before meals., Disp: 21 capsule, Rfl: 0 .  Fluocinolone Acetonide 0.01 % OIL, PLEASE SEE ATTACHED FOR DETAILED DIRECTIONS, Disp: , Rfl:  .  levocetirizine (XYZAL) 5 MG tablet, TAKE 1 TABLET BY MOUTH EVERY DAY IN THE EVENING, Disp: 30 tablet, Rfl: 5 .  meloxicam (MOBIC) 15 MG tablet, TAKE 1 TABLET BY MOUTH EVERY DAY, Disp: 90 tablet, Rfl: 0 .  Pediatric Multivit-Minerals-C (GUMMI BEAR MULTIVITAMIN/MIN) CHEW, Chew 2 tablets by mouth daily., Disp: , Rfl:  .  triamcinolone cream (KENALOG) 0.1 %, , Disp: , Rfl:  .  zinc gluconate 50 MG tablet, Take 50 mg by mouth daily., Disp: , Rfl:  .  Aspirin-Calcium Carbonate 81-777 MG TABS, Take by mouth., Disp: , Rfl:   Allergies  Allergen Reactions  . Nitrofurantoin Anaphylaxis  . Amoxicillin     rash  . Gabapentin   . Molds & Smuts   . Montelukast Sodium     tachycardia  . Nitrofurantoin Monohyd Macro Diarrhea, Itching and Nausea Only    Other  reaction(s): Abdominal pain, Dizziness, Unconsciousness  . Penicillin V Potassium Other (See Comments)  . Pollen Extract     I personally reviewed active problem list, medication list, allergies, family history, social history, health maintenance with the patient/caregiver today.   ROS  Ten systems reviewed and is negative except as mentioned in HPI   Objective  Virtual encounter, vitals not obtained.  There is no height or weight on file to calculate BMI.  Physical Exam  Awake, alert and oriented   PHQ2/9: Depression screen [facility] 2/9 09/16/2019 07/02/2019 04/11/2019 03/10/2019 01/06/2019  Decreased Interest 0 0 0 0 0  Down, Depressed, Hopeless 0 0 0 0 0  PHQ - 2 Score 0 0 0 0 0  Altered sleeping 0 0 0 0 -  Tired, decreased energy 0 1 0 0 -  Change in appetite 0 0 0 0 -  Feeling bad or failure about yourself  0 0 0 0 -  Trouble concentrating 0 0 0 0 -  Moving slowly or fidgety/restless 0 0 0 0 -  Suicidal thoughts 0 0 0 0 -  PHQ-9 Score 0 1 0 0 -  Difficult doing work/chores - Not difficult at all - Not difficult at all -   PHQ-2/9 Result is negative.    Fall Risk: Fall Risk  07/02/2019 04/11/2019 03/10/2019 01/06/2019 09/30/2018  Falls in the past year? 0 0 0 0 0  Number falls in past yr: 0 0 0 0 0  Injury with Fall? 0 0 0 0 -     Assessment & Plan  1. COVID-19 virus infection  - MYCHART COVID-19 HOME MONITORING PROGRAM - benzonatate (TESSALON) 100 MG capsule; Take 1-2 capsules (100-200 mg total) by mouth 2 (two) times daily as needed.  Dispense: 40 capsule; Refill: 0 - MyChart COVID-19 home monitoring program; Future - Temperature monitoring; Future  2. Moderate persistent asthma without complication  Advised to use Advair daily and if she develops symptoms of asthma flare to call back for medrol dose , explained to continue monitoring pulse ox and if drops below 90 to call 911  I discussed the assessment and treatment plan with the patient. The patient was provided an  opportunity to ask questions and all were answered. The patient agreed with the plan and demonstrated an understanding of the instructions.  The patient was advised to call back or seek an in-person evaluation if the symptoms worsen or if the condition fails to improve as anticipated.  I provided 15 minutes of non-face-to-face time during this encounter.

## 2019-10-06 ENCOUNTER — Encounter: Payer: BC Managed Care – PPO | Admitting: Family Medicine

## 2019-10-17 ENCOUNTER — Encounter: Payer: BC Managed Care – PPO | Admitting: Family Medicine

## 2019-10-27 ENCOUNTER — Other Ambulatory Visit: Payer: Self-pay | Admitting: Family Medicine

## 2019-10-27 DIAGNOSIS — J4540 Moderate persistent asthma, uncomplicated: Secondary | ICD-10-CM

## 2019-10-28 ENCOUNTER — Other Ambulatory Visit: Payer: Self-pay | Admitting: Family Medicine

## 2019-12-08 ENCOUNTER — Other Ambulatory Visit: Payer: Self-pay | Admitting: Obstetrics and Gynecology

## 2019-12-08 DIAGNOSIS — Z1231 Encounter for screening mammogram for malignant neoplasm of breast: Secondary | ICD-10-CM

## 2019-12-12 ENCOUNTER — Encounter: Payer: BC Managed Care – PPO | Admitting: Family Medicine

## 2019-12-12 ENCOUNTER — Encounter: Payer: Self-pay | Admitting: Family Medicine

## 2019-12-12 ENCOUNTER — Other Ambulatory Visit: Payer: Self-pay

## 2019-12-12 ENCOUNTER — Ambulatory Visit (INDEPENDENT_AMBULATORY_CARE_PROVIDER_SITE_OTHER): Payer: BC Managed Care – PPO | Admitting: Family Medicine

## 2019-12-12 VITALS — BP 120/52 | HR 105 | Temp 97.3°F | Resp 16 | Ht 62.5 in | Wt 183.5 lb

## 2019-12-12 DIAGNOSIS — Z0000 Encounter for general adult medical examination without abnormal findings: Secondary | ICD-10-CM

## 2019-12-12 DIAGNOSIS — J4540 Moderate persistent asthma, uncomplicated: Secondary | ICD-10-CM | POA: Diagnosis not present

## 2019-12-12 DIAGNOSIS — E785 Hyperlipidemia, unspecified: Secondary | ICD-10-CM | POA: Diagnosis not present

## 2019-12-12 DIAGNOSIS — M545 Low back pain, unspecified: Secondary | ICD-10-CM

## 2019-12-12 DIAGNOSIS — E538 Deficiency of other specified B group vitamins: Secondary | ICD-10-CM

## 2019-12-12 DIAGNOSIS — M5136 Other intervertebral disc degeneration, lumbar region: Secondary | ICD-10-CM

## 2019-12-12 DIAGNOSIS — J3089 Other allergic rhinitis: Secondary | ICD-10-CM

## 2019-12-12 DIAGNOSIS — M5126 Other intervertebral disc displacement, lumbar region: Secondary | ICD-10-CM

## 2019-12-12 MED ORDER — LEVOCETIRIZINE DIHYDROCHLORIDE 5 MG PO TABS: 5.0000 mg | ORAL_TABLET | Freq: Every evening | ORAL | 1 refills | Status: DC

## 2019-12-12 MED ORDER — FLUTICASONE-SALMETEROL 100-50 MCG/DOSE IN AEPB: 1.0000 | INHALATION_SPRAY | Freq: Two times a day (BID) | RESPIRATORY_TRACT | 0 refills | Status: DC

## 2019-12-12 NOTE — Progress Notes (Signed)
Name: Cathy Gray   MRN: [id number]    DOB: [birth year]   Date:12/12/2019       Progress Note  Subjective  Chief Complaint  Chief Complaint  Patient presents with  . Annual Exam    HPI  Patient presents for annual CPE.  Tinnitus both ears: she states occasionally waking up with mild headache behind left eye, she saw Dr. Pryor Ochoa and had some cerumen removed, was given some ear drops, but she continues to have constant tinnitus bilaterally, headache resolved, discussed contacting him back and see if she needs to be checked for hearing loss. Discussed white noise to help her sleep She states there is a family history of hearing loss on her father's side   Dyslipidemia: discussed previous labs   The 10-year ASCVD risk score Mikey Bussing DC Brooke Bonito., et al., 2013) is: 1.4%   Values used to calculate the score:     Age: 54 years     Sex: Female     Is Non-Hispanic African American: No     Diabetic: No     Tobacco smoker: No     Systolic Blood Pressure: 170 mmHg     Is BP treated: No     HDL Cholesterol: 64 mg/dL     Total Cholesterol: 226 mg/dL  B12: advised to take B12 supplementation once a week   Moderate asthma: she is taking Advair once a day, states currently no coughing , wheezing or SOB. She has not used rescue inhaler recently . She had COVID -19 Fall 2020, only sequelae is decrease in sense of taste and smell.   Intermittent back pain: she is doing well now, very seldom takes meloxicam.   Diet: trying to eat balanced  Exercise: continue daily walks  USPSTF grade A and B recommendations    Office Visit from 12/12/2019 in [facility]  AUDIT-C Score  0     Depression: Phq 9 is  negative Depression screen [facility] 2/9 12/12/2019 09/16/2019 07/02/2019 04/11/2019 03/10/2019  Decreased Interest 0 0 0 0 0  Down, Depressed, Hopeless 0 0 0 0 0  PHQ - 2 Score 0 0 0 0 0  Altered sleeping 0 0 0 0 0  Tired, decreased energy 0 0 1 0 0  Change in appetite 0 0 0 0 0  Feeling bad  or failure about yourself  0 0 0 0 0  Trouble concentrating 0 0 0 0 0  Moving slowly or fidgety/restless 0 0 0 0 0  Suicidal thoughts 0 0 0 0 0  PHQ-9 Score 0 0 1 0 0  Difficult doing work/chores - - Not difficult at all - Not difficult at all   Hypertension: BP Readings from Last 3 Encounters:  12/12/19 (!) 120/52  07/02/19 126/72  04/11/19 120/80   Obesity: Wt Readings from Last 3 Encounters:  12/12/19 183 lb 8 oz (83.2 kg)  07/02/19 188 lb 4.8 oz (85.4 kg)  04/11/19 183 lb 1.6 oz (83.1 kg)   BMI Readings from Last 3 Encounters:  12/12/19 33.03 kg/m  07/02/19 33.36 kg/m  04/11/19 32.43 kg/m     Hep C Screening: next time we get labs  STD testing and prevention (HIV/chl/gon/syphilis): N/A Intimate partner violence: negative screen  Sexual History (Partners/Practices/Protection from [employer] hx STI/Pregnancy Plans): married, monogamus Pain during Intercourse: no pain  Menstrual History/LMP/Abnormal Bleeding: s/p hystrectomy.  Incontinence Symptoms: no problems, had a sling   Breast cancer:  - Last Mammogram: scheduled for 01/2020 - BRCA gene screening:  N/A  Osteoporosis: Discussed high calcium and vitamin D supplementation, weight bearing exercises  Cervical cancer screening: N/A  Skin cancer: Discussed monitoring for atypical lesions  Colorectal cancer: repeat in 2023  Lung cancer:   Low Dose CT Chest recommended if Age 28-80 years, 30 pack-year currently smoking OR have quit w/in 15years. Patient does not qualify.   ECG: 2013  Advanced Care Planning: A voluntary discussion about advance care planning including the explanation and discussion of advance directives.  Discussed health care proxy and Living will, and the patient was able to identify a health care proxy as husband   Patient does have a living will at present time.   Lipids: Lab Results  Component Value Date   CHOL 226 (H) 07/02/2019   CHOL 221 (H) 03/11/2018   CHOL 249 (H) 10/04/2016   Lab  Results  Component Value Date   HDL 64 07/02/2019   HDL 64 03/11/2018   HDL 63 10/04/2016   Lab Results  Component Value Date   LDLCALC 132 (H) 07/02/2019   LDLCALC 138 (H) 03/11/2018   LDLCALC 162 (H) 10/04/2016   Lab Results  Component Value Date   TRIG 166 (H) 07/02/2019   TRIG 86 03/11/2018   TRIG 121 10/04/2016   Lab Results  Component Value Date   CHOLHDL 3.5 07/02/2019   CHOLHDL 3.5 03/11/2018   CHOLHDL 4.0 10/04/2016   No results found for: LDLDIRECT  Glucose: Glucose  Date Value Ref Range Status  12/09/2011 145 (H) 65 - 99 mg/dL Final   Glucose, Bld  Date Value Ref Range Status  07/02/2019 87 65 - 99 mg/dL Final    Comment:    .            Fasting reference interval .   01/06/2019 88 65 - 99 mg/dL Final    Comment:    .            Fasting reference interval .   03/11/2018 102 (H) 65 - 99 mg/dL Final    Comment:    .            Fasting reference interval . For someone without known diabetes, a glucose value between 100 and 125 mg/dL is consistent with prediabetes and should be confirmed with a follow-up test. .     Patient Active Problem List   Diagnosis Date Noted  . History of COVID-19 09/13/2019  . Plantar fasciitis of left foot 09/30/2018  . Bulging lumbar disc 05/14/2018  . Hypercholesteremia 04/18/2018  . Hyperglycemia 04/18/2018  . Vitamin D deficiency 04/18/2018  . B12 deficiency 04/18/2018  . History of recent fall 09/10/2017  . Obesity (BMI 30.0-34.9) 05/10/2017  . Asthma, moderate 07/30/2015  . Bilateral cataracts 07/30/2015  . Neuralgia neuritis, sciatic nerve 07/30/2015  . Recurrent UTI 07/30/2015    Past Surgical History:  Procedure Laterality Date  . ABDOMINAL HYSTERECTOMY    . BLADDER SUSPENSION  2007  . CESAREAN SECTION  2001  . COLONOSCOPY WITH PROPOFOL N/A 08/13/2017   Procedure: COLONOSCOPY WITH PROPOFOL;  Surgeon: Jonathon Bellows, MD;  Location[facility] ENDOSCOPY;  Service: Gastroenterology;  Laterality: N/A;  .  renal abscess  1994    Family History  Problem Relation Age of Onset  . Heart disease Father   . Asthma Father   . Diverticulosis Father   . Cancer Sister        Thyroid  . Anxiety disorder Daughter   . OCD Daughter   . Hyperlipidemia Maternal Grandmother   .  Diverticulosis Maternal Grandmother   . Diabetes Maternal Grandfather   . Asthma Paternal Grandmother   . Hyperlipidemia Paternal Grandmother   . Seizures Son   . Breast cancer Cousin 32       pat 1st     Social History   Socioeconomic History  . Marital status: Married    Spouse name: Arva Chafe  . Number of children: 3  . Years of education: Not on file  . Highest education level: Some college, no degree  Occupational History  . Not on file  Tobacco Use  . Smoking status: Never Smoker  . Smokeless tobacco: Never Used  Substance and Sexual Activity  . Alcohol use: Yes    Alcohol/week: 0.0 standard drinks    Comment: occasionally  . Drug use: No  . Sexual activity: Yes    Partners: Male    Birth control/protection: Other-see comments    Comment: Hysterectomy  Other Topics Concern  . 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   Social Determinants of Health   Financial Resource Strain:   . Difficulty of Paying Living Expenses: Not on file  Food Insecurity:   . Worried About Charity fundraiser in the Last Year: Not on file  . Ran Out of Food in the Last Year: Not on file  Transportation Needs:   . Lack of Transportation (Medical): Not on file  . Lack of Transportation (Non-Medical): Not on file  Physical Activity: Sufficiently Active  . Days of Exercise per Week: 6 days  . Minutes of Exercise per Session: 150+ min  Stress:   . Feeling of Stress : Not on file  Social Connections:   . Frequency of Communication with Friends and Family: Not on file  . Frequency of Social Gatherings with Friends and Family: Not on file  . Attends Religious  Services: Not on file  . Active Member of Clubs or Organizations: Not on file  . Attends Archivist Meetings: Not on file  . Marital Status: Not on file  Intimate Partner Violence:   . Fear of Current or Ex-Partner: Not on file  . Emotionally Abused: Not on file  . Physically Abused: Not on file  . Sexually Abused: Not on file     Current Outpatient Medications:  .  albuterol (VENTOLIN HFA) 108 (90 Base) MCG/ACT inhaler, TAKE 2 PUFFS BY MOUTH EVERY 6 HOURS AS NEEDED FOR WHEEZE OR SHORTNESS OF BREATH, Disp: 18 g, Rfl: 0 .  Ascorbic Acid (VITAMIN C) 1000 MG tablet, Take 1,000 mg by mouth daily., Disp: , Rfl:  .  Cholecalciferol (VITAMIN D) 50 MCG (2000 UT) CAPS, Take 2,000 Units by mouth daily., Disp: , Rfl:  .  cyclobenzaprine (FLEXERIL) 5 MG tablet, Take 1 tablet (5 mg total) by mouth 3 (three) times daily as needed., Disp: 90 tablet, Rfl: 0 .  levocetirizine (XYZAL) 5 MG tablet, TAKE 1 TABLET BY MOUTH EVERY DAY IN THE EVENING, Disp: 30 tablet, Rfl: 5 .  meloxicam (MOBIC) 15 MG tablet, TAKE 1 TABLET BY MOUTH EVERY DAY, Disp: 90 tablet, Rfl: 0 .  Pediatric Multivit-Minerals-C (GUMMI BEAR MULTIVITAMIN/MIN) CHEW, Chew 2 tablets by mouth daily., Disp: , Rfl:  .  zinc gluconate 50 MG tablet, Take 50 mg by mouth daily., Disp: , Rfl:  .  Aspirin-Calcium Carbonate 81-777 MG TABS, Take by mouth., Disp: , Rfl:  .  benzonatate (TESSALON) 100 MG capsule, Take  1-2 capsules (100-200 mg total) by mouth 2 (two) times daily as needed. (Patient not taking: Reported on 12/12/2019), Disp: 40 capsule, Rfl: 0 .  dicyclomine (BENTYL) 10 MG capsule, Take 1 capsule (10 mg total) by mouth 3 (three) times daily before meals., Disp: 21 capsule, Rfl: 0 .  Fluocinolone Acetonide 0.01 % OIL, PLEASE SEE ATTACHED FOR DETAILED DIRECTIONS, Disp: , Rfl:  .  triamcinolone cream (KENALOG) 0.1 %, , Disp: , Rfl:  .  WIXELA INHUB 250-50 MCG/DOSE AEPB, INHALE 1 PUFF BY MOUTH TWICE A DAY (Patient not taking: Reported on  12/12/2019), Disp: 60 each, Rfl: 0  Allergies  Allergen Reactions  . Nitrofurantoin Anaphylaxis  . Amoxicillin     rash  . Gabapentin   . Molds & Smuts   . Montelukast Sodium     tachycardia  . Nitrofurantoin Monohyd Macro Diarrhea, Itching and Nausea Only    Other reaction(s): Abdominal pain, Dizziness, Unconsciousness  . Penicillin V Potassium Other (See Comments)  . Pollen Extract      ROS  Constitutional: Negative for fever or weight change.  Respiratory: Negative for cough and shortness of breath, clears her throat but doing better .   Cardiovascular: Negative for chest pain or palpitations.  Gastrointestinal: Negative for abdominal pain, no bowel changes.  Musculoskeletal: Negative for gait problem or joint swelling.  Skin: Negative for rash.  Neurological: Negative for dizziness or headache.  No other specific complaints in a complete review of systems (except as listed in HPI above).  Objective  Vitals:   12/12/19 1307  BP: (!) 120/52  Pulse: (!) 105  Resp: 16  Temp: (!) 97.3 F (36.3 C)  TempSrc: Temporal  SpO2: 97%  Weight: 183 lb 8 oz (83.2 kg)  Height: 5' 2.5" (1.588 m)    Body mass index is 33.03 kg/m.  Physical Exam  Constitutional: Patient appears well-developed and well-nourished.Obese  No distress.  HENT: Head: Normocephalic and atraumatic. Ears: B TMs ok, no erythema or effusion; Nose and oral mucosa not done  Eyes: Conjunctivae and EOM are normal. Pupils are equal, round, and reactive to light. No scleral icterus.  Neck: Normal range of motion. Neck supple. No JVD present. No thyromegaly present.  Cardiovascular: Normal rate, regular rhythm and normal heart sounds.  No murmur heard. No BLE edema. Pulmonary/Chest: Effort normal and breath sounds normal. No respiratory distress. Abdominal: Soft. Bowel sounds are normal, no distension. There is no tenderness. no masses Breast: no lumps or masses, no nipple discharge or rashes FEMALE GENITALIA:   Not done  RECTAL: not done Musculoskeletal: Normal range of motion, no joint effusions. No gross deformities Neurological: he is alert and oriented to person, place, and time. No cranial nerve deficit. Coordination, balance, strength, speech and gait are normal.  Skin: Skin is warm and dry. No rash noted. No erythema.  Psychiatric: Patient has a normal mood and affect. behavior is normal. Judgment and thought content normal.  Fall Risk: Fall Risk  12/12/2019 07/02/2019 04/11/2019 03/10/2019 01/06/2019  Falls in the past year? 0 0 0 0 0  Number falls in past yr: 0 0 0 0 0  Injury with Fall? 0 0 0 0 0     Functional Status Survey: Is the patient deaf or have difficulty hearing?: No Does the patient have difficulty seeing, even when wearing glasses/contacts?: No Does the patient have difficulty concentrating, remembering, or making decisions?: No Does the patient have difficulty walking or climbing stairs?: No Does the patient have difficulty dressing or  bathing?: No Does the patient have difficulty doing errands alone such as visiting a doctor's office or shopping?: No   Assessment & Plan  1. Wellness examination   2. Dyslipidemia  Discussed life style modification  3. B12 deficiency  Last level was normal.  4. Intermittent low back pain  Doing well at this time  5. Bulging lumbar disc  No recent problems  6. Perennial allergic rhinitis  - levocetirizine (XYZAL) 5 MG tablet; Take 1 tablet (5 mg total) by mouth every evening.  Dispense: 90 tablet; Refill: 1  7. Moderate persistent asthma without complication  - Fluticasone-Salmeterol (ADVAIR) 100-50 MCG/DOSE AEPB; Inhale 1 puff into the lungs 2 (two) times daily.  Dispense: 1 each; Refill: 0  -USPSTF grade A and B recommendations reviewed with patient; age-appropriate recommendations, preventive care, screening tests, etc discussed and encouraged; healthy living encouraged; see AVS for patient education given to  patient -Discussed importance of 150 minutes of physical activity weekly, eat two servings of fish weekly, eat one serving of tree nuts ( cashews, pistachios, pecans, almonds.Marland Kitchen) every other day, eat 6 servings of fruit/vegetables daily and drink plenty of water and avoid sweet beverages.

## 2020-01-02 ENCOUNTER — Ambulatory Visit: Payer: BC Managed Care – PPO | Admitting: Family Medicine

## 2020-01-13 ENCOUNTER — Encounter: Payer: BC Managed Care – PPO | Admitting: Family Medicine

## 2020-01-20 ENCOUNTER — Other Ambulatory Visit: Payer: Self-pay

## 2020-01-20 ENCOUNTER — Other Ambulatory Visit: Payer: Self-pay | Admitting: Family Medicine

## 2020-01-20 DIAGNOSIS — J4540 Moderate persistent asthma, uncomplicated: Secondary | ICD-10-CM

## 2020-01-20 MED ORDER — FLUTICASONE-SALMETEROL 250-50 MCG/DOSE IN AEPB: 1.0000 | INHALATION_SPRAY | Freq: Two times a day (BID) | RESPIRATORY_TRACT | 5 refills | Status: DC

## 2020-01-22 ENCOUNTER — Ambulatory Visit
Admission: RE | Admit: 2020-01-22 | Discharge: 2020-01-22 | Disposition: A | Discharge status: Home / Self Care | Payer: BC Managed Care – PPO | Source: Ambulatory Visit | Attending: Obstetrics and Gynecology | Admitting: Obstetrics and Gynecology

## 2020-01-22 DIAGNOSIS — Z1231 Encounter for screening mammogram for malignant neoplasm of breast: Secondary | ICD-10-CM | POA: Diagnosis present

## 2020-04-19 ENCOUNTER — Ambulatory Visit: Payer: BC Managed Care – PPO | Admitting: Podiatry

## 2020-04-19 ENCOUNTER — Ambulatory Visit (INDEPENDENT_AMBULATORY_CARE_PROVIDER_SITE_OTHER): Payer: BC Managed Care – PPO

## 2020-04-19 ENCOUNTER — Encounter: Payer: Self-pay | Admitting: Podiatry

## 2020-04-19 ENCOUNTER — Other Ambulatory Visit: Payer: Self-pay | Admitting: Podiatry

## 2020-04-19 ENCOUNTER — Other Ambulatory Visit: Payer: Self-pay

## 2020-04-19 DIAGNOSIS — M722 Plantar fascial fibromatosis: Secondary | ICD-10-CM

## 2020-04-19 DIAGNOSIS — S99922A Unspecified injury of left foot, initial encounter: Secondary | ICD-10-CM

## 2020-04-19 MED ORDER — METHYLPREDNISOLONE 4 MG PO TBPK
ORAL_TABLET | ORAL | 0 refills | Status: DC
Start: 2020-04-19 — End: 2020-06-11

## 2020-04-19 NOTE — Progress Notes (Signed)
This patient presents the office with chief complaint of a severely painful left heel.  She says that she had a left heel injury last Friday while she was walking her dog.  She felt a tear occur in her left heel as well as pitting her heel against the pavement.  She states that she has been icing elevating and using a cam walker and taking Mobic for the last 2 days.  She says she has improved but she is still walking with an antalgic gait inverting her foot during gait.  She presents the office today wearing her sneaker and not her cam walker.  She previously has been seen by myself for plantar fasciitis for which I had recommended a surgical evaluation by Dr. Milinda Pointer.  She says that prior to the injury her left foot was asymptomatic.  she was unable to keep that appointment due to Covid.  She presents the office today for an evaluation and treatment of her left heel.  Vascular  Dorsalis pedis and posterior tibial pulses are palpable  B/L.  Capillary return  WNL.  Temperature gradient is  WNL.  Skin turgor  WNL  Sensorium  Senn Weinstein monofilament wire  WNL. Normal tactile sensation.  Nail Exam  Patient has normal nails with no evidence of bacterial or fungal infection.  Orthopedic  Exam  Muscle tone and muscle strength  WNL.  No limitations of motion feet  B/L.  No crepitus or joint effusion noted.  Foot type is unremarkable and digits show no abnormalities.  Bony prominences are unremarkable.  Patient has swelling and severe pain upon palpation at the insertion of the plantar fascia medial tuberosity left foot.  No evidence of any ecchymosis left heel.  Skin  No open lesions.  Normal skin texture and turgor.  Plantar fascia tear left heel  ROV.  X-ray was taken reveal an increase in the size of the calcification at the insertion of the medial tuberosity as well as a break in the spur.  Patient was diagnosed with a plantar fascial tear at the site of the separation of the calcification left heel.   Medrol Dosepak was prescribed for this patient.  She was told to discontinue her Mobic until after the Dosepak is finished.  Then she can repeat take the Mobic.  She was also instructed to walk with the cam walker and ice and elevate her left foot/return to clinic in 3 weeks.     Gardiner Barefoot DPM

## 2020-05-13 ENCOUNTER — Other Ambulatory Visit: Payer: Self-pay

## 2020-05-13 ENCOUNTER — Encounter: Payer: Self-pay | Admitting: Podiatry

## 2020-05-13 ENCOUNTER — Ambulatory Visit: Payer: BC Managed Care – PPO | Admitting: Podiatry

## 2020-05-13 DIAGNOSIS — S99922A Unspecified injury of left foot, initial encounter: Secondary | ICD-10-CM

## 2020-05-13 DIAGNOSIS — M722 Plantar fascial fibromatosis: Secondary | ICD-10-CM

## 2020-05-13 NOTE — Progress Notes (Signed)
This patient presents the office 3 weeks after being diagnosed with a rupture/injury to the plantar fascia left heel.  She says that she is still having sore throbbing pain especially at night after days activity.  Patient was treated with a Medrol Dosepak initially and then has been taking Mobic by mouth.  She has been ambulating in a Cam walker.  She states that it is still sore and she requests an injection in the left heel for her nighttime throbbing pain.  Vascular  Dorsalis pedis and posterior tibial pulses are palpable  B/L.  Capillary return  WNL.  Temperature gradient is  WNL.  Skin turgor  WNL  Sensorium  Senn Weinstein monofilament wire  WNL. Normal tactile sensation.  Nail Exam  Patient has normal nails with no evidence of bacterial or fungal infection.  Orthopedic  Exam  Muscle tone and muscle strength  WNL.  No limitations of motion feet  B/L.  No crepitus or joint effusion noted.  Foot type is unremarkable and digits show no abnormalities.  Bony prominences are unremarkable. Swelling left heel with pain upon palpation left heel.    Skin  No open lesions.  Normal skin texture and turgor.  Plantar fascia rupture /injury left heel.    Discussed this condition with this patient .  Told the patient that her pain at night is due to the swelling from a day of walking in the cam walker.  Told her I would prefer to apply an Unna boot to help to stabilize and eliminate the swelling in her left heel.  Since this was traumatic in nature I was hesitant on offering this patient an injection at this time.  We decided to wear the Unna boots in the cam walker for the next week and return to the office in 1 week for evaluation.  At that time if the pain persists we will consider injection therapy.  She says she received 3 injections previously and the first 2 were not helpful but the third injection was beneficial.  RTC 1 week for reevaluation of foot and further treatment.   Gardiner Barefoot DPM

## 2020-05-20 ENCOUNTER — Encounter: Payer: Self-pay | Admitting: Podiatry

## 2020-05-20 ENCOUNTER — Other Ambulatory Visit: Payer: Self-pay

## 2020-05-20 ENCOUNTER — Ambulatory Visit: Payer: BC Managed Care – PPO | Admitting: Podiatry

## 2020-05-20 DIAGNOSIS — M722 Plantar fascial fibromatosis: Secondary | ICD-10-CM

## 2020-05-20 DIAGNOSIS — S99922A Unspecified injury of left foot, initial encounter: Secondary | ICD-10-CM

## 2020-05-20 NOTE — Progress Notes (Signed)
This patient presents the office for continued evaluation and treatment of plantar fascial pain left foot.  Patient had a traumatic event which led to pain present in her left heel.  She was treated with a cam walker by itself and her pain persisted.  She was then put into an Haematologist and told to walk with an Haematologist as well as the cam walker.  She returns to the office today wearing the Unna boot and walking in the cam walker.  She says that she has improved about 90% but she still does experience a little pain when she walks without the boot.  Patient presents the office today for evaluation and treatment.  Vascular  Dorsalis pedis and posterior tibial pulses are palpable  B/L.  Capillary return  WNL.  Temperature gradient is  WNL.  Skin turgor  WNL  Sensorium  Senn Weinstein monofilament wire  WNL. Normal tactile sensation.  Nail Exam  Patient has normal nails with no evidence of bacterial or fungal infection.  Orthopedic  Exam  Muscle tone and muscle strength  WNL.  No limitations of motion feet  B/L.  No crepitus or joint effusion noted.  Foot type is unremarkable and digits show no abnormalities.  Bony prominences are unremarkable. Mild palpable pain center of left heel.  Skin  No open lesions.  Normal skin texture and turgor.  Plantar fasciitis  Left heel.  ROV.  Unna boot was removed.  Significant improvement with unna boot.  Patient was told to continue in cam walker and return to shoes to tolerance.  RTC prn   Gardiner Barefoot DPM

## 2020-06-11 ENCOUNTER — Encounter: Payer: Self-pay | Admitting: Family Medicine

## 2020-06-11 ENCOUNTER — Other Ambulatory Visit: Payer: Self-pay

## 2020-06-11 ENCOUNTER — Ambulatory Visit: Payer: BC Managed Care – PPO | Admitting: Family Medicine

## 2020-06-11 VITALS — BP 120/82 | HR 78 | Temp 98.7°F | Resp 16 | Ht 63.0 in | Wt 187.5 lb

## 2020-06-11 DIAGNOSIS — J4540 Moderate persistent asthma, uncomplicated: Secondary | ICD-10-CM

## 2020-06-11 DIAGNOSIS — E785 Hyperlipidemia, unspecified: Secondary | ICD-10-CM | POA: Diagnosis not present

## 2020-06-11 DIAGNOSIS — M545 Low back pain, unspecified: Secondary | ICD-10-CM

## 2020-06-11 DIAGNOSIS — M5126 Other intervertebral disc displacement, lumbar region: Secondary | ICD-10-CM

## 2020-06-11 DIAGNOSIS — Z79899 Other long term (current) drug therapy: Secondary | ICD-10-CM

## 2020-06-11 DIAGNOSIS — J3089 Other allergic rhinitis: Secondary | ICD-10-CM | POA: Diagnosis not present

## 2020-06-11 DIAGNOSIS — E559 Vitamin D deficiency, unspecified: Secondary | ICD-10-CM

## 2020-06-11 DIAGNOSIS — E538 Deficiency of other specified B group vitamins: Secondary | ICD-10-CM | POA: Diagnosis not present

## 2020-06-11 DIAGNOSIS — M722 Plantar fascial fibromatosis: Secondary | ICD-10-CM

## 2020-06-11 DIAGNOSIS — R238 Other skin changes: Secondary | ICD-10-CM

## 2020-06-11 DIAGNOSIS — Z1159 Encounter for screening for other viral diseases: Secondary | ICD-10-CM

## 2020-06-11 DIAGNOSIS — R233 Spontaneous ecchymoses: Secondary | ICD-10-CM

## 2020-06-11 DIAGNOSIS — M5136 Other intervertebral disc degeneration, lumbar region: Secondary | ICD-10-CM

## 2020-06-11 DIAGNOSIS — R232 Flushing: Secondary | ICD-10-CM

## 2020-06-11 DIAGNOSIS — Z131 Encounter for screening for diabetes mellitus: Secondary | ICD-10-CM

## 2020-06-11 MED ORDER — LEVOCETIRIZINE DIHYDROCHLORIDE 5 MG PO TABS: 5.0000 mg | ORAL_TABLET | Freq: Every evening | ORAL | 1 refills | Status: DC

## 2020-06-11 MED ORDER — FLUTICASONE-SALMETEROL 250-50 MCG/DOSE IN AEPB: 1.0000 | INHALATION_SPRAY | Freq: Two times a day (BID) | RESPIRATORY_TRACT | 5 refills | Status: DC

## 2020-06-11 NOTE — Progress Notes (Signed)
Name: Cathy Gray   MRN: [id number]    DOB: 12/23/1965   Date:06/11/2020       Progress Note  Subjective  Chief Complaint  Chief Complaint  Patient presents with   Follow-up   Asthma    HPI   Tinnitus both ears: no longer constant and no headaches , doing better  Dyslipidemia: discussed previous labs , we will recheck labs today   The 10-year ASCVD risk score Mikey Bussing DC Brooke Bonito., et al., 2013) is: 1.4%   Values used to calculate the score:     Age: 54 years     Sex: Female     Is Non-Hispanic African American: No     Diabetic: No     Tobacco smoker: No     Systolic Blood Pressure: 295 mmHg     Is BP treated: No     HDL Cholesterol: 64 mg/dL     Total Cholesterol: 226 mg/dL    B12: only taking supplementation on her multivitamin   Moderate asthma: she is taking Advair once a day, states currently no coughing , wheezing or SOB. She has not used rescue inhaler recently. She had COVID -19 Fall 2020, only sequelae is decrease in sense of taste and smell, did not affect her breating.   Intermittent back pain: she is doing well now, very seldom takes meloxicam. She states she is more careful about activity.   Easy bruising: she has been working more in her yard , and has noticed more bruising on her legs and sometimes on her arms.    Vitamin D deficiency: she is taking otc supplementation   Left heel fracture : she had rupture of left plantar fascia and pain on the arch has resolved, but heel pain is still present, it was secondary to having been dragged by her [place] dog down a few steps. She states pain has improved, she saw podiatrist , had to wear a boot , but no longer needed.   Patient Active Problem List   Diagnosis Date Noted   History of COVID-19 09/13/2019   Plantar fasciitis of left foot 09/30/2018   Bulging lumbar disc 05/14/2018   Hypercholesteremia 04/18/2018   Hyperglycemia 04/18/2018   Vitamin D deficiency 04/18/2018   B12 deficiency  04/18/2018   History of recent fall 09/10/2017   Obesity (BMI 30.0-34.9) 05/10/2017   Asthma, moderate 07/30/2015   Bilateral cataracts 07/30/2015   Neuralgia neuritis, sciatic nerve 07/30/2015   Recurrent UTI 07/30/2015    Past Surgical History:  Procedure Laterality Date   ABDOMINAL HYSTERECTOMY     BLADDER SUSPENSION  2007   CESAREAN SECTION  2001   COLONOSCOPY WITH PROPOFOL N/A 08/13/2017   Procedure: COLONOSCOPY WITH PROPOFOL;  Surgeon: Jonathon Bellows, MD;  Location[facility] ENDOSCOPY;  Service: Gastroenterology;  Laterality: N/A;   renal abscess  1994    Family History  Problem Relation Age of Onset   Heart disease Father    Asthma Father    Diverticulosis Father    Cancer Sister        Thyroid   Anxiety disorder Daughter    OCD Daughter    Hyperlipidemia Maternal Grandmother    Diverticulosis Maternal Grandmother    Diabetes Maternal Grandfather    Asthma Paternal Grandmother    Hyperlipidemia Paternal Grandmother    Seizures Son    Breast cancer Cousin 26       pat 1st     Social History   Tobacco Use   Smoking status: Never  Smoker   Smokeless tobacco: Never Used  Substance Use Topics   Alcohol use: Yes    Alcohol/week: 0.0 standard drinks    Comment: occasionally     Current Outpatient Medications:    albuterol (VENTOLIN HFA) 108 (90 Base) MCG/ACT inhaler, TAKE 2 PUFFS BY MOUTH EVERY 6 HOURS AS NEEDED FOR WHEEZE OR SHORTNESS OF BREATH, Disp: 18 g, Rfl: 0   Ascorbic Acid (VITAMIN C) 1000 MG tablet, Take 1,000 mg by mouth daily., Disp: , Rfl:    Cholecalciferol (VITAMIN D) 50 MCG (2000 UT) CAPS, Take 2,000 Units by mouth daily., Disp: , Rfl:    cyclobenzaprine (FLEXERIL) 5 MG tablet, Take 1 tablet (5 mg total) by mouth 3 (three) times daily as needed., Disp: 90 tablet, Rfl: 0   Fluticasone-Salmeterol (ADVAIR DISKUS) 250-50 MCG/DOSE AEPB, Inhale 1 puff into the lungs 2 (two) times daily., Disp: 60 each, Rfl: 5   levocetirizine  (XYZAL) 5 MG tablet, Take 1 tablet (5 mg total) by mouth every evening., Disp: 90 tablet, Rfl: 1   meloxicam (MOBIC) 15 MG tablet, TAKE 1 TABLET BY MOUTH EVERY DAY, Disp: 90 tablet, Rfl: 0   Pediatric Multivit-Minerals-C (GUMMI BEAR MULTIVITAMIN/MIN) CHEW, Chew 2 tablets by mouth daily., Disp: , Rfl:    zinc gluconate 50 MG tablet, Take 50 mg by mouth daily., Disp: , Rfl:    Aspirin-Calcium Carbonate 81-777 MG TABS, Take by mouth. (Patient not taking: Reported on 06/11/2020), Disp: , Rfl:    Fluocinolone Acetonide 0.01 % OIL, PLEASE SEE ATTACHED FOR DETAILED DIRECTIONS (Patient not taking: Reported on 06/11/2020), Disp: , Rfl:    methylPREDNISolone (MEDROL DOSEPAK) 4 MG TBPK tablet, Use as directed (Patient not taking: Reported on 06/11/2020), Disp: 30 tablet, Rfl: 0   triamcinolone cream (KENALOG) 0.1 %, , Disp: , Rfl:    Zinc Acetate, Oral, (ZINC ACETATE PO), Take by mouth. (Patient not taking: Reported on 06/11/2020), Disp: , Rfl:   Allergies  Allergen Reactions   Nitrofurantoin Anaphylaxis   Amoxicillin     rash   Gabapentin    Molds & Smuts    Montelukast Sodium     tachycardia   Nitrofurantoin Monohyd Macro Diarrhea, Itching and Nausea Only    Other reaction(s): Abdominal pain, Dizziness, Unconsciousness   Penicillin V Potassium Other (See Comments)   Pollen Extract     I personally reviewed active problem list, medication list, allergies, family history, social history, health maintenance with the patient/caregiver today.   ROS  Constitutional: Negative for fever or weight change.  Respiratory: Negative for cough and shortness of breath.   Cardiovascular: Negative for chest pain or palpitations.  Gastrointestinal: Negative for abdominal pain, no bowel changes.  Musculoskeletal: Negative for gait problem or joint swelling.  Skin: Negative for rash.  Neurological: Negative for dizziness or headache.  No other specific complaints in a complete review of systems  (except as listed in HPI above).  Objective  Vitals:   06/11/20 0846  BP: 120/82  Pulse: 78  Resp: 16  Temp: 98.7 F (37.1 C)  TempSrc: Temporal  SpO2: 99%  Weight: 187 lb 8 oz (85 kg)  Height: 5\' 3"  (1.6 m)    Body mass index is 33.21 kg/m.  Physical Exam  Constitutional: Patient appears well-developed and well-nourished. Obese No distress.  HEENT: head atraumatic, normocephalic, pupils equal and reactive to light, ears normal TM, neck supple, throat within normal limits Cardiovascular: Normal rate, regular rhythm and normal heart sounds.  No murmur heard. No BLE edema. Pulmonary/Chest:  Effort normal and breath sounds normal. No respiratory distress. Abdominal: Soft.  There is no tenderness. Psychiatric: Patient has a normal mood and affect. behavior is normal. Judgment and thought content normal.  PHQ2/9: Depression screen [facility] 2/9 06/11/2020 12/12/2019 09/16/2019 07/02/2019 04/11/2019  Decreased Interest 0 0 0 0 0  Down, Depressed, Hopeless 0 0 0 0 0  PHQ - 2 Score 0 0 0 0 0  Altered sleeping 0 0 0 0 0  Tired, decreased energy 0 0 0 1 0  Change in appetite 0 0 0 0 0  Feeling bad or failure about yourself  0 0 0 0 0  Trouble concentrating 0 0 0 0 0  Moving slowly or fidgety/restless 0 0 0 0 0  Suicidal thoughts 0 0 0 0 0  PHQ-9 Score 0 0 0 1 0  Difficult doing work/chores - - - Not difficult at all -    phq 9 is negative   Fall Risk: Fall Risk  06/11/2020 12/12/2019 07/02/2019 04/11/2019 03/10/2019  Falls in the past year? 0 0 0 0 0  Number falls in past yr: 0 0 0 0 0  Injury with Fall? 0 0 0 0 0     Assessment & Plan  1. Intermittent low back pain   2. Dyslipidemia  - Lipid panel  3. B12 deficiency  - Vitamin B12  4. Perennial allergic rhinitis  - levocetirizine (XYZAL) 5 MG tablet; Take 1 tablet (5 mg total) by mouth every evening.  Dispense: 90 tablet; Refill: 1  5. Diabetes mellitus screening  - Hemoglobin A1c  6. Moderate persistent asthma without  complication  - Fluticasone-Salmeterol (ADVAIR DISKUS) 250-50 MCG/DOSE AEPB; Inhale 1 puff into the lungs 2 (two) times daily.  Dispense: 60 each; Refill: 5  7. Hot flashes   8. Plantar fasciitis of left foot   9. Bulging lumbar disc   10. Vitamin D deficiency  - VITAMIN D 25 Hydroxy (Vit-D Deficiency, Fractures)  11. Easy bruising  - CBC with Differential/Platelet - PTT  12. Long-term use of high-risk medication  - COMPLETE METABOLIC PANEL WITH GFR  13. Need for hepatitis C screening test  - Hepatitis C antibody

## 2020-06-14 LAB — COMPLETE METABOLIC PANEL WITH GFR
AG Ratio: 1.8 (calc) (ref 1.0–2.5)
ALT: 25 U/L (ref 6–29)
AST: 19 U/L (ref 10–35)
Albumin: 4.6 g/dL (ref 3.6–5.1)
Alkaline phosphatase (APISO): 65 U/L (ref 37–153)
BUN: 15 mg/dL (ref 7–25)
CO2: 27 mmol/L (ref 20–32)
Calcium: 9.7 mg/dL (ref 8.6–10.4)
Chloride: 102 mmol/L (ref 98–110)
Creat: 0.75 mg/dL (ref 0.50–1.05)
GFR, Est African American: 105 mL/min/{1.73_m2} (ref >=60)
GFR, Est Non African American: 91 mL/min/{1.73_m2} (ref >=60)
Globulin: 2.5 g/dL (calc) (ref 1.9–3.7)
Glucose, Bld: 98 mg/dL (ref 65–99)
Potassium: 4.4 mmol/L (ref 3.5–5.3)
Sodium: 140 mmol/L (ref 135–146)
Total Bilirubin: 0.7 mg/dL (ref 0.2–1.2)
Total Protein: 7.1 g/dL (ref 6.1–8.1)

## 2020-06-14 LAB — CBC WITH DIFFERENTIAL/PLATELET
Absolute Monocytes: 479 cells/uL (ref 200–950)
Basophils Absolute: 50 cells/uL (ref 0–200)
Basophils Relative: 0.8 %
Eosinophils Absolute: 101 cells/uL (ref 15–500)
Eosinophils Relative: 1.6 %
HCT: 41.6 % (ref 35.0–45.0)
Hemoglobin: 14.3 g/dL (ref 11.7–15.5)
Lymphs Abs: 2029 cells/uL (ref 850–3900)
MCH: 32.6 pg (ref 27.0–33.0)
MCHC: 34.4 g/dL (ref 32.0–36.0)
MCV: 95 fL (ref 80.0–100.0)
MPV: 10 fL (ref 7.5–12.5)
Monocytes Relative: 7.6 %
Neutro Abs: 3641 cells/uL (ref 1500–7800)
Neutrophils Relative %: 57.8 %
Platelets: 266 10*3/uL (ref 140–400)
RBC: 4.38 10*6/uL (ref 3.80–5.10)
RDW: 11.8 % (ref 11.0–15.0)
Total Lymphocyte: 32.2 %
WBC: 6.3 10*3/uL (ref 3.8–10.8)

## 2020-06-14 LAB — LIPID PANEL
Cholesterol: 281 mg/dL — ABNORMAL HIGH (ref <200)
HDL: 63 mg/dL (ref >=50)
LDL Cholesterol (Calc): 187 mg/dL (calc) — ABNORMAL HIGH
Non-HDL Cholesterol (Calc): 218 mg/dL (calc) — ABNORMAL HIGH (ref <130)
Total CHOL/HDL Ratio: 4.5 (calc) (ref <5.0)
Triglycerides: 166 mg/dL — ABNORMAL HIGH (ref <150)

## 2020-06-14 LAB — HEMOGLOBIN A1C
Hgb A1c MFr Bld: 5.6 % of total Hgb (ref <5.7)
Mean Plasma Glucose: 114 (calc)
eAG (mmol/L): 6.3 (calc)

## 2020-06-14 LAB — VITAMIN D 25 HYDROXY (VIT D DEFICIENCY, FRACTURES): Vit D, 25-Hydroxy: 14 ng/mL — ABNORMAL LOW (ref 30–100)

## 2020-06-14 LAB — HEPATITIS C ANTIBODY
Hepatitis C Ab: NONREACTIVE
SIGNAL TO CUT-OFF: 0.01 (ref <1.00)

## 2020-06-14 LAB — VITAMIN B12: Vitamin B-12: 460 pg/mL (ref 200–1100)

## 2020-06-14 LAB — APTT: aPTT: 28 s (ref 23–32)

## 2020-06-19 ENCOUNTER — Other Ambulatory Visit: Payer: Self-pay | Admitting: Family Medicine

## 2020-06-19 DIAGNOSIS — Z8616 Personal history of COVID-19: Secondary | ICD-10-CM

## 2020-06-22 LAB — SARS-COV-2 SEMI-QUANTITATIVE TOTAL ANTIBODY, SPIKE
SARS-CoV-2 Semi-Quant Total Ab: 493 U/mL (ref <0.8)
SARS-CoV-2 Spike Ab Interp: POSITIVE

## 2020-06-23 ENCOUNTER — Telehealth: Payer: Self-pay

## 2020-06-23 ENCOUNTER — Other Ambulatory Visit: Payer: Self-pay | Admitting: Family Medicine

## 2020-06-23 MED ORDER — ROSUVASTATIN CALCIUM 10 MG PO TABS: 10.0000 mg | ORAL_TABLET | Freq: Every day | ORAL | 1 refills | Status: DC

## 2020-06-23 NOTE — Telephone Encounter (Signed)
Patient called.  Patient aware.  

## 2020-06-23 NOTE — Telephone Encounter (Signed)
Copied from [place] [id number]. Topic: General - Other >> Jun 21, 2020 10:58 AM Celene Kras wrote: Reason for CRM: Pt returning call she states that she did not receive a  vm. Please advise.  Patient was calling back in regards to her labs. She is willing to take the Crestor and wants it sent to [facility] in [place].

## 2020-07-26 NOTE — Progress Notes (Signed)
Patient ID: Antoinetta Berrones, female    DOB: Aug 18, 1966, 54 y.o.   MRN: [id number]  PCP: Steele Sizer, MD  Chief Complaint  Patient presents with  . Wrist Pain    left wrist swollen and painful, she is a Museum/gallery curator, pain started one week ago  . Leg Pain    patient mentions ever since she started Crestor script she feels from her right hip to her knee is sore and it "feels like I ran a marathon, my leg is alway pulsing"    Subjective:   Kimoni Pickerill is a 54 y.o. female, presents to clinic with CC of the following:  Chief Complaint  Patient presents with  . Wrist Pain    left wrist swollen and painful, she is a Museum/gallery curator, pain started one week ago  . Leg Pain    patient mentions ever since she started Crestor script she feels from her right hip to her knee is sore and it "feels like I ran a marathon, my leg is alway pulsing"    HPI:  Patient is a 54 year old female patient of Dr. Ancil Boozer Presents today with the above complaints.  She notes the left wrist has been painful for the past 1-1/2 weeks, denies any major trauma before onset.  She is a Museum/gallery curator, and has frequent use of the left wrist, and notes it is painful when she puts pressure on her wrist area with wrist extension, a pushing down motion.  She feels the pain in a discrete area where there is a firm bump on the ulnar aspect.  Also notes it is a little swollen in this area.  She denies any major injuries to her wrist in her past.  Denies any weakness, no numbness or tingling in the arm or hand.   She also notes after she started Crestor for her lipids, she feels a discomfort from her outer right hip area down to her knee at times, often feels more fatigued in this area.  She notes the discomfort is felt in the outer aspect of the right hip, and hurts after she lays on that area.  Denies any numbness or tingling down the leg, no marked pain anteriorly at the hip joint proper area.  She also denies any  trauma before this came on.  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  Patient Active Problem List   Diagnosis Date Noted  . History of COVID-19 09/13/2019  . Plantar fasciitis of left foot 09/30/2018  . Bulging lumbar disc 05/14/2018  . Hypercholesteremia 04/18/2018  . Hyperglycemia 04/18/2018  . Vitamin D deficiency 04/18/2018  . B12 deficiency 04/18/2018  . History of recent fall 09/10/2017  . Obesity (BMI 30.0-34.9) 05/10/2017  . Asthma, moderate 07/30/2015  . Bilateral cataracts 07/30/2015  . Neuralgia neuritis, sciatic nerve 07/30/2015  . Recurrent UTI 07/30/2015      Current Outpatient Medications:  .  albuterol (VENTOLIN HFA) 108 (90 Base) MCG/ACT inhaler, TAKE 2 PUFFS BY MOUTH EVERY 6 HOURS AS NEEDED FOR WHEEZE OR SHORTNESS OF BREATH, Disp: 18 g, Rfl: 0 .  Ascorbic Acid (VITAMIN C) 1000 MG tablet, Take 1,000 mg by mouth daily., Disp: , Rfl:  .  Cholecalciferol (VITAMIN D) 50 MCG (2000 UT) CAPS, Take 2,000 Units by mouth daily., Disp: , Rfl:  .  cyclobenzaprine (FLEXERIL) 5 MG tablet, Take 1 tablet (5 mg total) by mouth 3 (three) times daily as needed., Disp: 90 tablet, Rfl: 0 .  Fluticasone-Salmeterol (ADVAIR DISKUS) 250-50 MCG/DOSE AEPB, Inhale 1 puff into the lungs 2 (two) times daily., Disp: 60 each, Rfl: 5 .  levocetirizine (XYZAL) 5 MG tablet, Take 1 tablet (5 mg total) by mouth every evening., Disp: 90 tablet, Rfl: 1 .  meloxicam (MOBIC) 15 MG tablet, TAKE 1 TABLET BY MOUTH EVERY DAY, Disp: 90 tablet, Rfl: 0 .  Pediatric Multivit-Minerals-C (GUMMI BEAR MULTIVITAMIN/MIN) CHEW, Chew 2 tablets by mouth daily., Disp: , Rfl:  .  rosuvastatin (CRESTOR) 10 MG tablet, Take 1 tablet (10 mg total) by mouth daily., Disp: 90 tablet, Rfl: 1 .  zinc gluconate 50 MG tablet, Take 50 mg by mouth daily., Disp: , Rfl:    Allergies  Allergen Reactions  . Nitrofurantoin Anaphylaxis  . Amoxicillin     rash  . Gabapentin   . Molds & Smuts   . Montelukast Sodium      tachycardia  . Nitrofurantoin Monohyd Macro Diarrhea, Itching and Nausea Only    Other reaction(s): Abdominal pain, Dizziness, Unconsciousness  . Penicillin V Potassium Other (See Comments)  . Pollen Extract      Past Surgical History:  Procedure Laterality Date  . ABDOMINAL HYSTERECTOMY    . BLADDER SUSPENSION  2007  . CESAREAN SECTION  2001  . COLONOSCOPY WITH PROPOFOL N/A 08/13/2017   Procedure: COLONOSCOPY WITH PROPOFOL;  Surgeon: Jonathon Bellows, MD;  Location[facility] ENDOSCOPY;  Service: Gastroenterology;  Laterality: N/A;  . renal abscess  1994     Family History  Problem Relation Age of Onset  . Heart disease Father   . Asthma Father   . Diverticulosis Father   . Cancer Sister        Thyroid  . Anxiety disorder Daughter   . OCD Daughter   . Hyperlipidemia Maternal Grandmother   . Diverticulosis Maternal Grandmother   . Diabetes Maternal Grandfather   . Asthma Paternal Grandmother   . Hyperlipidemia Paternal Grandmother   . Seizures Son   . Breast cancer Cousin 32       pat 1st      Social History   Tobacco Use  . Smoking status: Never Smoker  . Smokeless tobacco: Never Used  Substance Use Topics  . Alcohol use: Yes    Alcohol/week: 0.0 standard drinks    Comment: occasionally    With staff assistance, above reviewed with the patient today.  ROS: As per HPI, otherwise no specific complaints on a limited and focused system review   No results found for this or any previous visit (from the past 72 hour(s)).   PHQ2/9: Depression screen [facility] 2/9 07/27/2020 06/11/2020 12/12/2019 09/16/2019 07/02/2019  Decreased Interest 0 0 0 0 0  Down, Depressed, Hopeless 0 0 0 0 0  PHQ - 2 Score 0 0 0 0 0  Altered sleeping - 0 0 0 0  Tired, decreased energy - 0 0 0 1  Change in appetite - 0 0 0 0  Feeling bad or failure about yourself  - 0 0 0 0  Trouble concentrating - 0 0 0 0  Moving slowly or fidgety/restless - 0 0 0 0  Suicidal thoughts - 0 0 0 0  PHQ-9 Score - 0 0 0 1    Difficult doing work/chores - - - - Not difficult at all   PHQ-2/9 Result is neg Fall Risk: Fall Risk  07/27/2020 06/11/2020 12/12/2019 07/02/2019 04/11/2019  Falls in the past year? 0 0 0 0 0  Number falls in past yr: 0 0  0 0 0  Injury with Fall? 0 0 0 0 0  Follow up Falls evaluation completed - - - -      Objective:   Vitals:   07/27/20 0908  BP: 124/80  Pulse: 86  Resp: 16  Temp: 98 F (36.7 C)  TempSrc: Oral  SpO2: 100%  Weight: 190 lb 8 oz (86.4 kg)  Height: 5\' 3"  (1.6 m)    Body mass index is 33.75 kg/m.  Physical Exam   NAD, masked, pleasant HEENT - [place]/AT, sclera anicteric,  Ext -tender with palpation over the trochanteric bursa region of the right hip, nontender palpating the anterior joint of the hip, no marked discomfort with hip flexion while supine, with hip external rotation, she felt discomfort at the trochanteric bursa region, no discomfort with internal rotation, and no pain at the hip joint proper with internal or external rotation.  Adequate strength of the lower extremity noted.  Sensation intact to light touch in the distal extremity Left wrist-had good range of motion of the left wrist, with mild discomfort felt at the area of the nodularity on the radial aspect with flexion and extension, more with a Finkelstein's test, Nontender palpating the wrist joint diffusely, tender to palpate the small firm nodularity above the wrist joint on the radial aspect, no overlying erythema, slight soft tissue swelling over top the nodularity, with it felt to be over top the radius, and at times movable. Squeeze test was negative She had adequate wrist strength and good grip strength, Incision intact to light touch over the extremity and hand, with distal pulses good, Neuro/psychiatric - affect was not flat, appropriate with conversation  Alert with normal speech  Results for orders placed or performed in visit on 06/19/20  SARS-CoV-2 Semi-Quantitative Total Antibody, Spike   Result Value Ref Range   SARS-CoV-2 Semi-Quant Total Ab 493.0 Negative<0.8 U/mL   SARS-CoV-2 Spike Ab Interp Positive        Assessment & Plan:   1. Left wrist pain Exact source unclear, with the pain noted at the firm nodularity proximal to the wrist joint proper. Do feel getting an x-ray will be helpful Discussed attentional calcifications that can occur, involving tendons as a possible source, with it not seeming like a typical ganglion cyst, as does not feel cystic. Await x-ray result,  Recommended cold to the area topically, Also modifications of activities in the short-term recommended, especially spotting activities with respect to her being a Museum/gallery curator. DG Wrist Complete Left; Future  2. Trochanteric bursitis of right hip Educated on this, and recommended cold to the area topically Also can use the Mobic product she has more regularly for a few days, take with food as can help with the pain and inflammation. Avoid laying on this area or chronic compression to the area,  Discussed if not improving, a potential injection that sometimes is helpful  3. Dyslipidemia She currently is on Crestor, and I do not feel that is a source to her above symptoms, I do feel continuing the Crestor would be helpful noting her abnormal lipid panel previously  Await x-ray result presently, and will follow up after.    Towanda Malkin, MD 07/27/20 9:25 AM

## 2020-07-27 ENCOUNTER — Ambulatory Visit: Payer: BC Managed Care – PPO | Admitting: Internal Medicine

## 2020-07-27 ENCOUNTER — Encounter: Payer: Self-pay | Admitting: Internal Medicine

## 2020-07-27 ENCOUNTER — Ambulatory Visit
Admission: RE | Admit: 2020-07-27 | Discharge: 2020-07-27 | Disposition: A | Discharge status: Home / Self Care | Payer: BC Managed Care – PPO | Source: Ambulatory Visit | Attending: Internal Medicine | Admitting: Internal Medicine

## 2020-07-27 ENCOUNTER — Other Ambulatory Visit: Payer: Self-pay | Admitting: Family Medicine

## 2020-07-27 ENCOUNTER — Ambulatory Visit
Admission: RE | Admit: 2020-07-27 | Discharge: 2020-07-27 | Disposition: A | Discharge status: Home / Self Care | Payer: BC Managed Care – PPO | Attending: Internal Medicine | Admitting: Internal Medicine

## 2020-07-27 ENCOUNTER — Other Ambulatory Visit: Payer: Self-pay

## 2020-07-27 VITALS — BP 124/80 | HR 86 | Temp 98.0°F | Resp 16 | Ht 63.0 in | Wt 190.5 lb

## 2020-07-27 DIAGNOSIS — M25532 Pain in left wrist: Secondary | ICD-10-CM

## 2020-07-27 DIAGNOSIS — E785 Hyperlipidemia, unspecified: Secondary | ICD-10-CM

## 2020-07-27 DIAGNOSIS — E559 Vitamin D deficiency, unspecified: Secondary | ICD-10-CM

## 2020-07-27 DIAGNOSIS — M7061 Trochanteric bursitis, right hip: Secondary | ICD-10-CM | POA: Diagnosis not present

## 2020-07-27 MED ORDER — VITAMIN D (ERGOCALCIFEROL) 1.25 MG (50000 UNIT) PO CAPS: 50000.0000 [IU] | ORAL_CAPSULE | ORAL | 1 refills | Status: DC

## 2020-07-27 NOTE — Patient Instructions (Signed)
Please obtain the x-ray today of the left wrist  Can apply cold topically to the wrist, and also appliy to the right hip region as recommended  Can use the Mobic more regularly for the next few days to help with the hip discomfort (trochanteric bursitis)   Hip Bursitis  Hip bursitis is swelling of a fluid-filled sac (bursa) in your hip joint. This swelling (inflammation) can be painful. This condition may come and go over time. What are the causes?  Injury to the hip.  Overuse of the muscles that surround the hip joint.  An earlier injury or surgery of the hip.  Arthritis or gout.  Diabetes.  Thyroid disease.  Infection.  In some cases, the cause may not be known. What are the signs or symptoms?  Mild or moderate pain in the hip area. Pain may get worse with movement.  Tenderness and swelling of the hip, especially on the outer side of the hip.  In rare cases, the bursa may become infected. This may cause: ? A fever. ? Warmth and redness in the area. Symptoms may come and go. How is this treated? This condition is treated by resting, icing, applying pressure (compression), and raising (elevating) the injured area. You may hear this called the RICE treatment. Treatment may also include:  Using crutches.  Draining fluid out of the bursa to help relieve swelling.  Giving a shot of (injecting) medicine that helps to reduce swelling (cortisone).  Other medicines if the bursa is infected. Follow these instructions at home: Managing pain, stiffness, and swelling   If told, put ice on the painful area. ? Put ice in a plastic bag. ? Place a towel between your skin and the bag. ? Leave the ice on for 20 minutes, 2-3 times a day. ? Raise (elevate) your hip above the level of your heart as much as you can without pain. To do this, try putting a pillow under your hips while you lie down. Stop if this causes pain. Activity  Return to your normal activities as told by your  doctor. Ask your doctor what activities are safe for you.  Rest and protect your hip as much as you can until you feel better. General instructions  Take over-the-counter and prescription medicines only as told by your doctor.  Wear wraps that put pressure on your hip (compression wraps) only as told by your doctor.  Do not use your hip to support your body weight until your doctor says that you can.  Use crutches as told by your doctor.  Gently rub and stretch your injured area as often as is comfortable.  Keep all follow-up visits as told by your doctor. This is important. How is this prevented?  Exercise regularly, as told by your doctor.  Warm up and stretch before being active.  Cool down and stretch after being active.  Avoid activities that bother your hip or cause pain.  Avoid sitting down for long periods at a time. Contact a doctor if:  You have a fever.  You get new symptoms.  You have trouble walking.  You have trouble doing everyday activities.  You have pain that gets worse.  You have pain that does not get better with medicine.  You get red skin on your hip area.  You get a feeling of warmth in your hip area. Get help right away if:  You cannot move your hip.  You have very bad pain. Summary  Hip bursitis is swelling of  a fluid-filled sac (bursa) in your hip.  Hip bursitis can be painful.  Symptoms often come and go over time.  This condition is treated with rest, ice, compression, elevation, and medicines. This information is not intended to replace advice given to you by your health care provider. Make sure you discuss any questions you have with your health care provider. Document Revised: 07/01/2018 Document Reviewed: 07/01/2018 Elsevier Patient Education  [facility] [place].

## 2020-07-28 ENCOUNTER — Ambulatory Visit
Admission: RE | Admit: 2020-07-28 | Discharge: 2020-07-28 | Disposition: A | Discharge status: Home / Self Care | Payer: BC Managed Care – PPO | Source: Ambulatory Visit | Attending: Internal Medicine | Admitting: Internal Medicine

## 2020-07-28 ENCOUNTER — Ambulatory Visit
Admission: RE | Admit: 2020-07-28 | Discharge: 2020-07-28 | Disposition: A | Discharge status: Home / Self Care | Payer: BC Managed Care – PPO | Attending: Internal Medicine | Admitting: Internal Medicine

## 2020-07-28 DIAGNOSIS — M25532 Pain in left wrist: Secondary | ICD-10-CM | POA: Insufficient documentation

## 2020-07-30 ENCOUNTER — Telehealth: Payer: Self-pay

## 2020-07-30 DIAGNOSIS — M25532 Pain in left wrist: Secondary | ICD-10-CM

## 2020-07-30 NOTE — Telephone Encounter (Signed)
Copied from [place] [id number]. Topic: Referral - Request for Referral >> Jul 30, 2020  7:16 AM Lennox Solders wrote: Has patient seen PCP for this complaint? Yes . Pt would like a referral to ortho for lump on her left wrist. Pt is still in pain. Pt saw dr Roxan Hockey on 07-27-2020. [facility]

## 2020-08-03 ENCOUNTER — Telehealth: Payer: Self-pay

## 2020-08-03 DIAGNOSIS — S66311A Strain of extensor muscle, fascia and tendon of left index finger at wrist and hand level, initial encounter: Secondary | ICD-10-CM | POA: Insufficient documentation

## 2020-08-03 DIAGNOSIS — M6740 Ganglion, unspecified site: Secondary | ICD-10-CM | POA: Insufficient documentation

## 2020-08-03 NOTE — Telephone Encounter (Signed)
Copied from [place] [id number]. Topic: General - Other >> Aug 03, 2020  8:00 AM Oneta Rack wrote: Reason for CRM: patient was referred to an orthopedic surgeron and the specialist is requesting the imaging report and images. Please follow up with patient when completed

## 2020-08-03 NOTE — Telephone Encounter (Signed)
Called patient. She has what she needs.

## 2020-08-30 ENCOUNTER — Encounter: Payer: Self-pay | Admitting: Podiatry

## 2020-08-30 ENCOUNTER — Other Ambulatory Visit: Payer: Self-pay

## 2020-08-30 ENCOUNTER — Ambulatory Visit: Payer: BC Managed Care – PPO | Admitting: Podiatry

## 2020-08-30 DIAGNOSIS — M722 Plantar fascial fibromatosis: Secondary | ICD-10-CM | POA: Diagnosis not present

## 2020-08-30 NOTE — Progress Notes (Signed)
This patient presents the office for continued evaluation of a painful left foot.  She was seen 3 months ago and had an injury to her plantar fascia insertion left foot.  We treated her and her pain has resolved and her left heel.  She now points to pain along the inside of her left foot as the site of pain.  She says that approximately 3 days ago she had significant swelling and inflammation and was unable to bear weight on her left foot.  She says that her left foot started resolving and is much better today.  She presents the office today for continued evaluation and treatment of this condition.  Vascular  Dorsalis pedis and posterior tibial pulses are palpable  B/L.  Capillary return  WNL.  Temperature gradient is  WNL.  Skin turgor  WNL  Sensorium  Senn Weinstein monofilament wire  WNL. Normal tactile sensation.  Nail Exam  Patient has normal nails with no evidence of bacterial or fungal infection.  Orthopedic  Exam  Muscle tone and muscle strength  WNL.  No limitations of motion feet  B/L.  No crepitus or joint effusion noted.  Foot type is unremarkable and digits show no abnormalities.  Bony prominences are unremarkable.  Palpable pain noted along the medial band of the plantar fascia left foot distal to the first MPJ.  No redness swelling noted.   Skin  No open lesions.  Normal skin texture and turgor.  Plantar fasciitis  ROV> examination of her foot reveals no evidence of any redness swelling or inflammation in her left foot.  She does have localized pain noted to the plantar fascia medial band.  Patient states that she is 80% improved from the other day.  We discussed this condition and we decided to treat her with orthoses new shoes and Mobic.  Patient was told if the problem returns for her to make an appointment for her to be reevaluated.  She describes her ankle swelling and it sounds as if she had an acute ankle arthritic flareup.   RTC prn

## 2020-11-24 HISTORY — PX: DE QUERVAIN'S RELEASE: SHX1439

## 2020-11-26 IMAGING — MG DIGITAL SCREENING BILATERAL MAMMOGRAM WITH TOMO AND CAD
8 series · 8 of 24 positions shown · non-contrast
Comparison: Previous exam(s).

CLINICAL DATA: Screening.

EXAM:
DIGITAL SCREENING BILATERAL MAMMOGRAM WITH TOMO AND CAD

[L CC synth-2D]
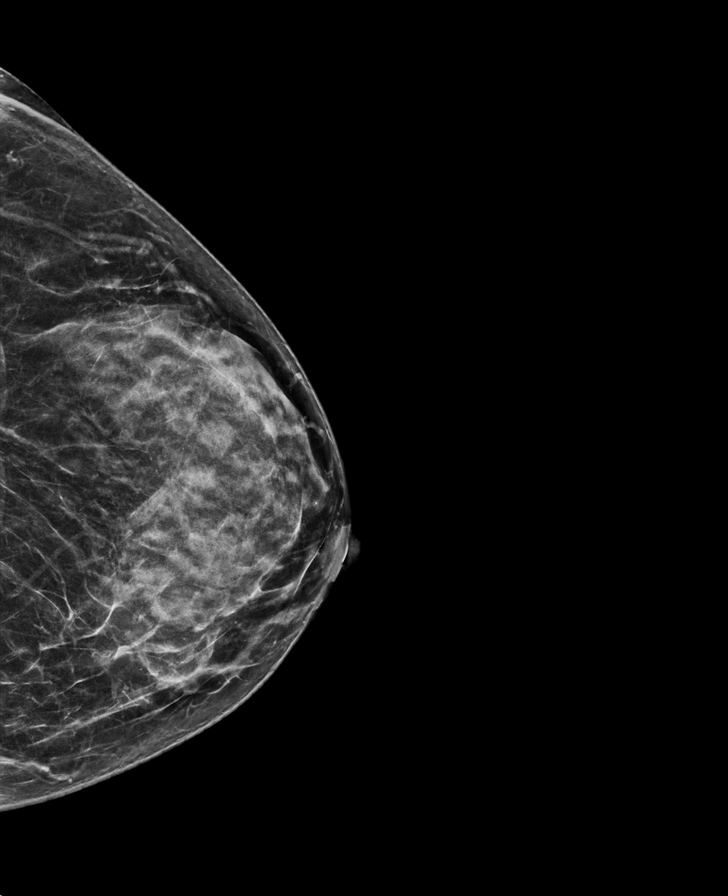

[R CC synth-2D]
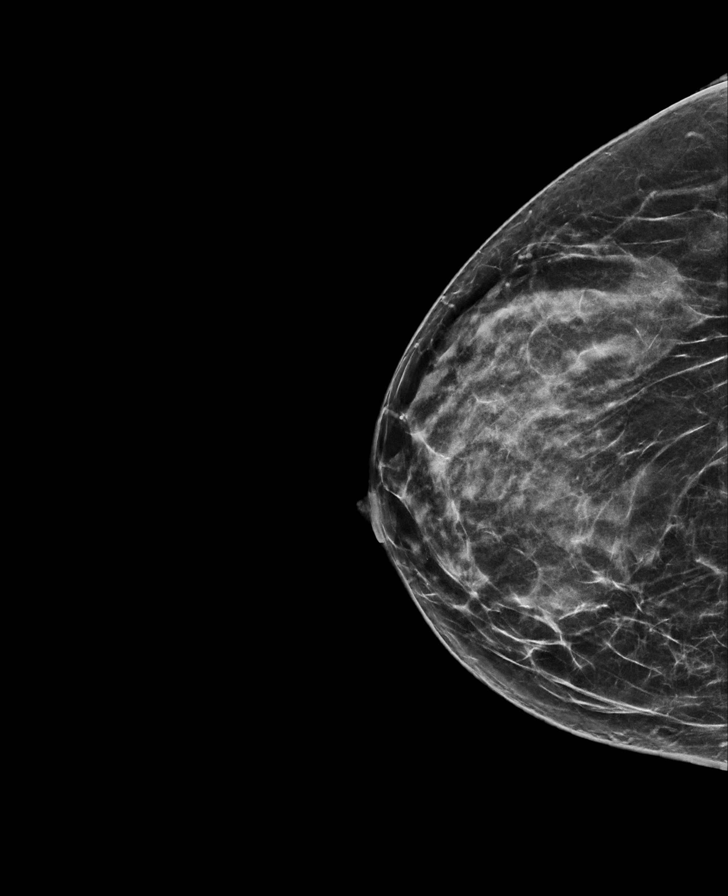

[L MLO synth-2D]
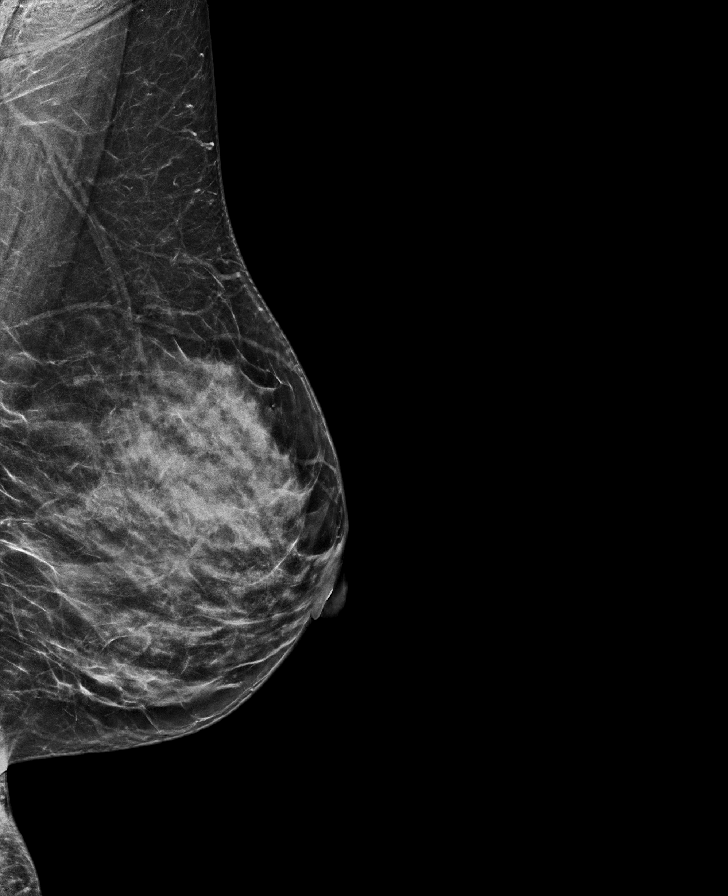

[R MLO synth-2D]
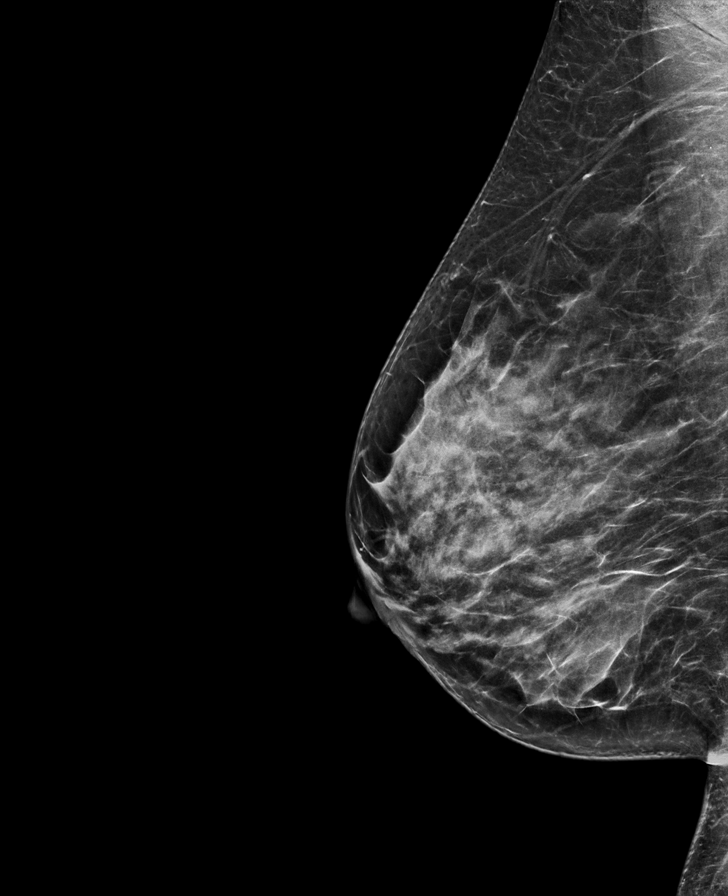

[L CC tomo · tomo slice 31/60.0]
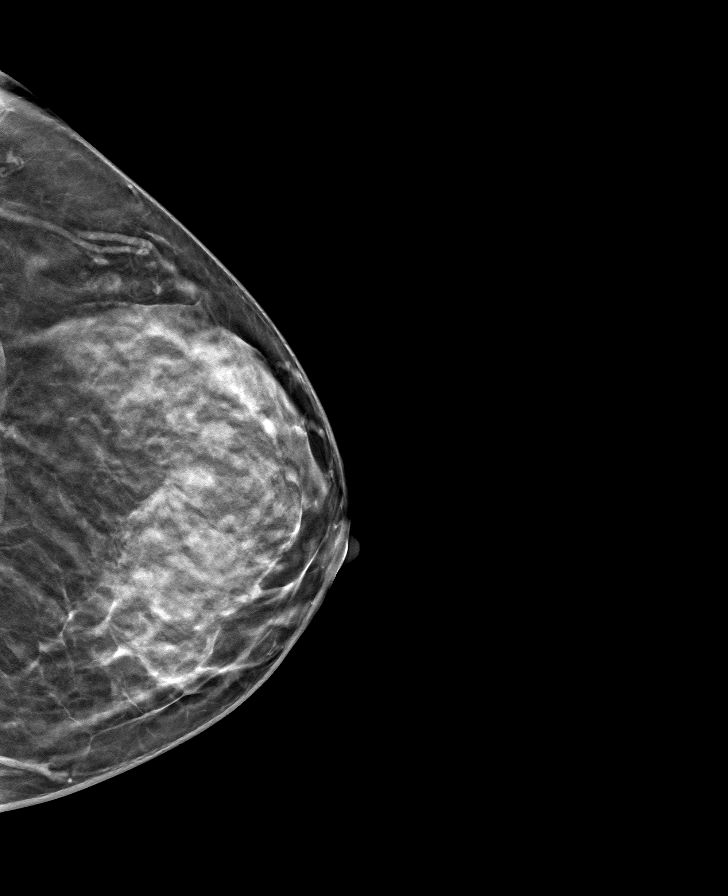

[R CC tomo · tomo slice 31/60.0]
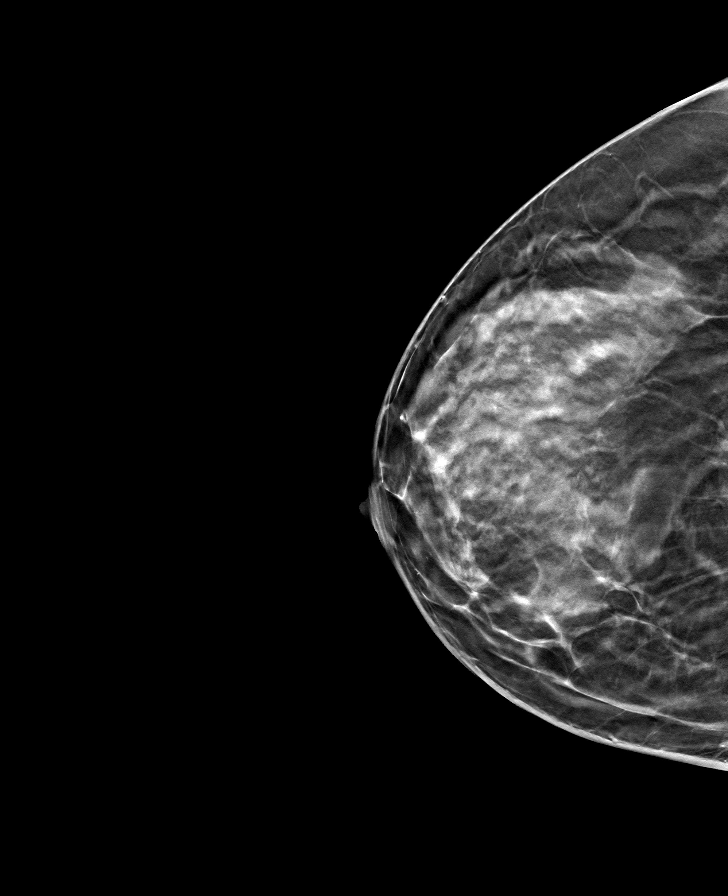

[R MLO tomo · tomo slice 33/64.0]
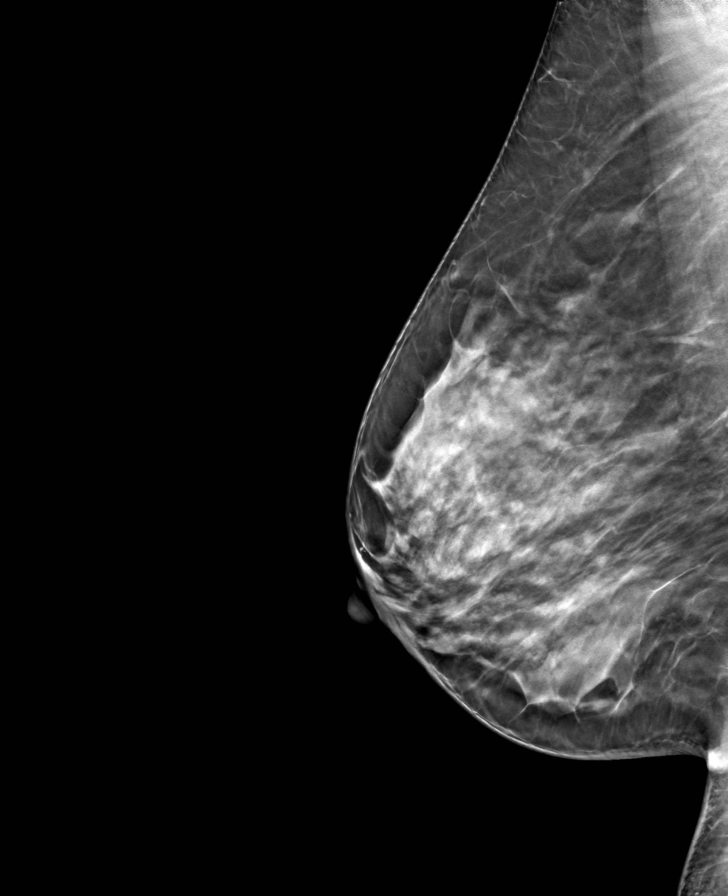

[L MLO tomo · tomo slice 31/61.0]
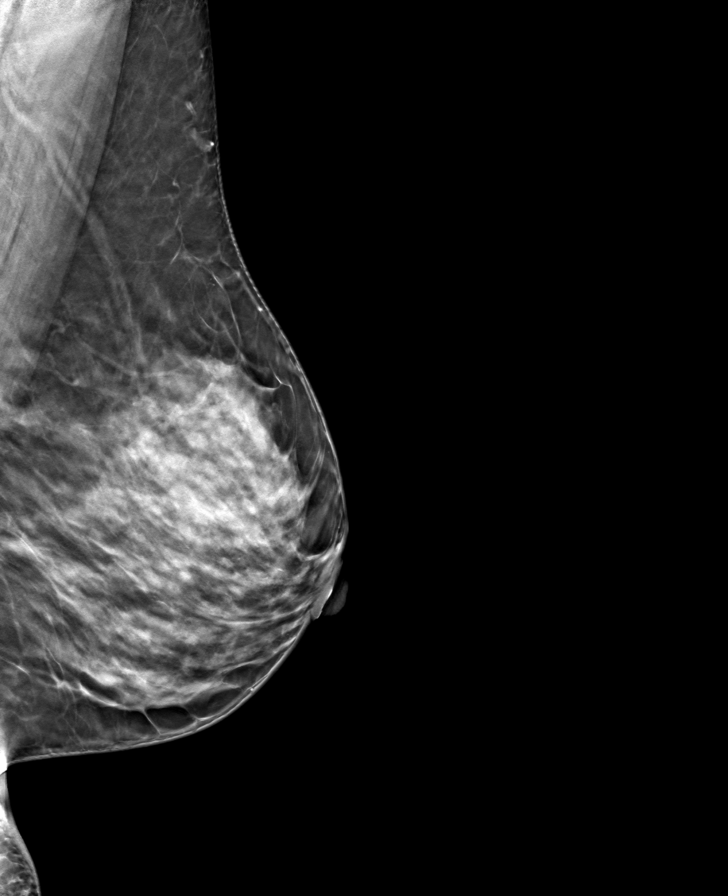

[8 of 24 positions shown; findings below may reference images not displayed]

ACR Breast Density Category c: The breast tissue is heterogeneously
dense, which may obscure small masses.
FINDINGS: There are no findings suspicious for malignancy. Images were
processed with CAD.
IMPRESSION: No mammographic evidence of malignancy. A result letter of this
screening mammogram will be mailed directly to the patient.

RECOMMENDATION:
Screening mammogram in one year. (Code:FT-U-LHB)

BI-RADS CATEGORY  1: Negative.

## 2020-12-14 NOTE — Progress Notes (Deleted)
Name: Cathy Gray   MRN: [id number]    DOB: Apr 15, 1966   Date:12/14/2020       Progress Note  Subjective  Chief Complaint  Follow up  HPI Tinnitus both ears: no longer constant and no headaches , doing better  Dyslipidemia: discussed previous labs , we will recheck labs today   The 10-year ASCVD risk score Mikey Bussing DC Brooke Bonito., et al., 2013) is: 1.4%   Values used to calculate the score:     Age: 55 years     Sex: Female     Is Non-Hispanic African American: No     Diabetic: No     Tobacco smoker: No     Systolic Blood Pressure: 237 mmHg     Is BP treated: No     HDL Cholesterol: 64 mg/dL     Total Cholesterol: 226 mg/dL    B12: only taking supplementation on her multivitamin   Moderate asthma: she is taking Advair once a day, states currently no coughing , wheezing or SOB. She has not used rescue inhaler recently. She had COVID -19 Fall 2020, only sequelae is decrease in sense of taste and smell, did not affect her breating.   Intermittent back pain: she is doing well now, very seldom takes meloxicam. She states she is more careful about activity.   Easy bruising: she has been working more in her yard , and has noticed more bruising on her legs and sometimes on her arms.    Vitamin D deficiency: she is taking otc supplementation   Left heel fracture : she had rupture of left plantar fascia and pain on the arch has resolved, but heel pain is still present, it was secondary to having been dragged by her [place] dog down a few steps. She states pain has improved, she saw podiatrist , had to wear a boot , but no longer needed.   *** Patient Active Problem List   Diagnosis Date Noted  . Trochanteric bursitis of right hip 07/27/2020  . History of COVID-19 09/13/2019  . Plantar fasciitis of left foot 09/30/2018  . Bulging lumbar disc 05/14/2018  . Hypercholesteremia 04/18/2018  . Hyperglycemia 04/18/2018  . Vitamin D deficiency 04/18/2018  . B12 deficiency 04/18/2018  .  History of recent fall 09/10/2017  . Obesity (BMI 30.0-34.9) 05/10/2017  . Asthma, moderate 07/30/2015  . Bilateral cataracts 07/30/2015  . Neuralgia neuritis, sciatic nerve 07/30/2015  . Recurrent UTI 07/30/2015    Past Surgical History:  Procedure Laterality Date  . ABDOMINAL HYSTERECTOMY    . BLADDER SUSPENSION  2007  . CESAREAN SECTION  2001  . COLONOSCOPY WITH PROPOFOL N/A 08/13/2017   Procedure: COLONOSCOPY WITH PROPOFOL;  Surgeon: Jonathon Bellows, MD;  Location: [facility] ENDOSCOPY;  Service: Gastroenterology;  Laterality: N/A;  . renal abscess  1994    Family History  Problem Relation Age of Onset  . Heart disease Father   . Asthma Father   . Diverticulosis Father   . Cancer Sister        Thyroid  . Anxiety disorder Daughter   . OCD Daughter   . Hyperlipidemia Maternal Grandmother   . Diverticulosis Maternal Grandmother   . Diabetes Maternal Grandfather   . Asthma Paternal Grandmother   . Hyperlipidemia Paternal Grandmother   . Seizures Son   . Breast cancer Cousin 32       pat 1st     Social History   Tobacco Use  . Smoking status: Never Smoker  . Smokeless tobacco:  Never Used  Substance Use Topics  . Alcohol use: Yes    Alcohol/week: 0.0 standard drinks    Comment: occasionally     Current Outpatient Medications:  .  albuterol (VENTOLIN HFA) 108 (90 Base) MCG/ACT inhaler, TAKE 2 PUFFS BY MOUTH EVERY 6 HOURS AS NEEDED FOR WHEEZE OR SHORTNESS OF BREATH, Disp: 18 g, Rfl: 0 .  Ascorbic Acid (VITAMIN C) 1000 MG tablet, Take 1,000 mg by mouth daily., Disp: , Rfl:  .  Cholecalciferol (VITAMIN D) 50 MCG (2000 UT) CAPS, Take 2,000 Units by mouth daily., Disp: , Rfl:  .  cyclobenzaprine (FLEXERIL) 5 MG tablet, Take 1 tablet (5 mg total) by mouth 3 (three) times daily as needed., Disp: 90 tablet, Rfl: 0 .  Fluticasone-Salmeterol (ADVAIR DISKUS) 250-50 MCG/DOSE AEPB, Inhale 1 puff into the lungs 2 (two) times daily., Disp: 60 each, Rfl: 5 .  levocetirizine (XYZAL) 5 MG  tablet, Take 1 tablet (5 mg total) by mouth every evening., Disp: 90 tablet, Rfl: 1 .  meloxicam (MOBIC) 15 MG tablet, TAKE 1 TABLET BY MOUTH EVERY DAY, Disp: 90 tablet, Rfl: 0 .  Pediatric Multivit-Minerals-C (GUMMI BEAR MULTIVITAMIN/MIN) CHEW, Chew 2 tablets by mouth daily., Disp: , Rfl:  .  rosuvastatin (CRESTOR) 10 MG tablet, Take 1 tablet (10 mg total) by mouth daily., Disp: 90 tablet, Rfl: 1 .  Vitamin D, Ergocalciferol, (DRISDOL) 1.25 MG (50000 UNIT) CAPS capsule, Take 1 capsule (50,000 Units total) by mouth every 7 (seven) days., Disp: 12 capsule, Rfl: 1 .  zinc gluconate 50 MG tablet, Take 50 mg by mouth daily., Disp: , Rfl:   Allergies  Allergen Reactions  . Nitrofurantoin Anaphylaxis  . Amoxicillin     rash  . Gabapentin   . Molds & Smuts   . Montelukast Sodium     tachycardia  . Nitrofurantoin Monohyd Macro Diarrhea, Itching and Nausea Only    Other reaction(s): Abdominal pain, Dizziness, Unconsciousness  . Penicillin V Potassium Other (See Comments)  . Pollen Extract     I personally reviewed {Reviewed:14835} with the patient/caregiver today.   ROS  ***  Objective  There were no vitals filed for this visit.  There is no height or weight on file to calculate BMI.  Physical Exam ***  No results found for this or any previous visit (from the past 2160 hour(s)).  Diabetic Foot Exam: Diabetic Foot Exam - Simple   No data filed    ***  PHQ2/9: Depression screen [facility] 2/9 07/27/2020 06/11/2020 12/12/2019 09/16/2019 07/02/2019  Decreased Interest 0 0 0 0 0  Down, Depressed, Hopeless 0 0 0 0 0  PHQ - 2 Score 0 0 0 0 0  Altered sleeping - 0 0 0 0  Tired, decreased energy - 0 0 0 1  Change in appetite - 0 0 0 0  Feeling bad or failure about yourself  - 0 0 0 0  Trouble concentrating - 0 0 0 0  Moving slowly or fidgety/restless - 0 0 0 0  Suicidal thoughts - 0 0 0 0  PHQ-9 Score - 0 0 0 1  Difficult doing work/chores - - - - Not difficult at all    phq 9 is {gen  pos NTZ:001749} ***  Fall Risk: Fall Risk  07/27/2020 06/11/2020 12/12/2019 07/02/2019 04/11/2019  Falls in the past year? 0 0 0 0 0  Number falls in past yr: 0 0 0 0 0  Injury with Fall? 0 0 0 0 0  Follow up  Falls evaluation completed - - - -   ***   Functional Status Survey:   ***   Assessment & Plan  *** There are no diagnoses linked to this encounter.

## 2020-12-16 ENCOUNTER — Ambulatory Visit: Payer: BC Managed Care – PPO | Admitting: Family Medicine

## 2020-12-30 ENCOUNTER — Other Ambulatory Visit: Payer: Self-pay | Admitting: Family Medicine

## 2021-01-03 ENCOUNTER — Other Ambulatory Visit: Payer: Self-pay | Admitting: Obstetrics and Gynecology

## 2021-01-03 DIAGNOSIS — Z1231 Encounter for screening mammogram for malignant neoplasm of breast: Secondary | ICD-10-CM

## 2021-01-19 NOTE — Progress Notes (Signed)
Name: Celsa Nordahl   MRN: [id number]    DOB: Mar 11, 1966   Date:01/20/2021       Progress Note  Subjective  Chief Complaint  Follow Up  HPI  Tinnitus both ears: she was seen by ENT and was given reassurance, it is worse on the left side. Discussed white noise     Dyslipidemia: reviewed last labs and LDL was over 180, she resumed statin therapy, no side effects. We will recheck labs today   The 10-year ASCVD risk score Mikey Bussing DC Brooke Bonito., et al., 2013) is: 2.4%   Values used to calculate the score:     Age: 55 years     Sex: Female     Is Non-Hispanic African American: No     Diabetic: No     Tobacco smoker: No     Systolic Blood Pressure: 245 mmHg     Is BP treated: No     HDL Cholesterol: 63 mg/dL     Total Cholesterol: 281 mg/dL   B12: only taking supplementation on her multivitamin advised to try taking one capsule sub-lingually once a week   Moderate asthma: she is taking Advair once a day, states currently no coughing , wheezing or SOB. She has not used rescue inhaler recently. She had COVID -19 Fall 2020, she states taste is finally back, but sense of smell is not back to normal yet. She did not have any problems with breathing at the time  Obesity: she started tracking her calorie intake, having a max of 1538 calories per day, she is taking a Golo release capsules to help with insulin resistance and has lost 6 lbs in the past 4 weeks . She states eating smaller portions, avoiding processed food and is pleased with the weight loss.   Intermittent back pain: she is doing well now, very seldom takes meloxicam, has not taken cyclobenzaprine in a long time . She states she is more careful about activity. Unchanged   Vitamin D deficiency: she is taking otc supplementation, we will recheck labs today   Left DeQuervain's tenosynovitis release: done at [facility] outpatient, stiches removed and doing well   Patient Active Problem List   Diagnosis Date Noted  . Strain of extensor  muscle, fascia and tendon of left index finger at wrist and hand level, initial encounter 08/03/2020  . Ganglion cyst 08/03/2020  . Trochanteric bursitis of right hip 07/27/2020  . History of COVID-19 09/13/2019  . Plantar fasciitis of left foot 09/30/2018  . Bulging lumbar disc 05/14/2018  . Hypercholesteremia 04/18/2018  . Hyperglycemia 04/18/2018  . Vitamin D deficiency 04/18/2018  . B12 deficiency 04/18/2018  . History of recent fall 09/10/2017  . Obesity (BMI 30.0-34.9) 05/10/2017  . Asthma, moderate 07/30/2015  . Bilateral cataracts 07/30/2015  . Neuralgia neuritis, sciatic nerve 07/30/2015  . Recurrent UTI 07/30/2015    Past Surgical History:  Procedure Laterality Date  . ABDOMINAL HYSTERECTOMY    . BLADDER SUSPENSION  2007  . CESAREAN SECTION  2001  . COLONOSCOPY WITH PROPOFOL N/A 08/13/2017   Procedure: COLONOSCOPY WITH PROPOFOL;  Surgeon: Jonathon Bellows, MD;  Location: [facility] ENDOSCOPY;  Service: Gastroenterology;  Laterality: N/A;  . renal abscess  1994    Family History  Problem Relation Age of Onset  . Heart disease Father   . Asthma Father   . Diverticulosis Father   . Cancer Sister        Thyroid  . Anxiety disorder Daughter   . OCD Daughter   .  Hyperlipidemia Maternal Grandmother   . Diverticulosis Maternal Grandmother   . Diabetes Maternal Grandfather   . Asthma Paternal Grandmother   . Hyperlipidemia Paternal Grandmother   . Seizures Son   . Breast cancer Cousin 32       pat 1st     Social History   Tobacco Use  . Smoking status: Never Smoker  . Smokeless tobacco: Never Used  Substance Use Topics  . Alcohol use: Yes    Alcohol/week: 0.0 standard drinks    Comment: occasionally     Current Outpatient Medications:  .  albuterol (VENTOLIN HFA) 108 (90 Base) MCG/ACT inhaler, TAKE 2 PUFFS BY MOUTH EVERY 6 HOURS AS NEEDED FOR WHEEZE OR SHORTNESS OF BREATH, Disp: 18 g, Rfl: 0 .  Ascorbic Acid (VITAMIN C) 1000 MG tablet, Take 1,000 mg by mouth daily.,  Disp: , Rfl:  .  Cholecalciferol (VITAMIN D) 50 MCG (2000 UT) CAPS, Take 2,000 Units by mouth daily., Disp: , Rfl:  .  cyclobenzaprine (FLEXERIL) 5 MG tablet, Take 1 tablet (5 mg total) by mouth 3 (three) times daily as needed., Disp: 90 tablet, Rfl: 0 .  meloxicam (MOBIC) 15 MG tablet, TAKE 1 TABLET BY MOUTH EVERY DAY, Disp: 90 tablet, Rfl: 0 .  Pediatric Multivit-Minerals-C (GUMMI BEAR MULTIVITAMIN/MIN) CHEW, Chew 2 tablets by mouth daily., Disp: , Rfl:  .  zinc gluconate 50 MG tablet, Take 50 mg by mouth daily., Disp: , Rfl:  .  Fluticasone-Salmeterol (ADVAIR DISKUS) 250-50 MCG/DOSE AEPB, Inhale 1 puff into the lungs 2 (two) times daily., Disp: 60 each, Rfl: 5 .  levocetirizine (XYZAL) 5 MG tablet, Take 1 tablet (5 mg total) by mouth every evening., Disp: 90 tablet, Rfl: 1 .  rosuvastatin (CRESTOR) 10 MG tablet, Take 1 tablet (10 mg total) by mouth daily., Disp: 90 tablet, Rfl: 1  Allergies  Allergen Reactions  . Nitrofurantoin Anaphylaxis  . Amoxicillin     rash  . Gabapentin   . Molds & Smuts   . Montelukast Sodium     tachycardia  . Nitrofurantoin Monohyd Macro Diarrhea, Itching and Nausea Only    Other reaction(s): Abdominal pain, Dizziness, Unconsciousness  . Penicillin V Potassium Other (See Comments)  . Pollen Extract     I personally reviewed active problem list, medication list, allergies, family history, social history, health maintenance with the patient/caregiver today.   ROS  Constitutional: Negative for fever or weight change.  Respiratory: Negative for cough and shortness of breath.   Cardiovascular: Negative for chest pain or palpitations.  Gastrointestinal: Negative for abdominal pain, no bowel changes.  Musculoskeletal: Negative for gait problem or joint swelling.  Skin: Negative for rash.  Neurological: Negative for dizziness or headache.  No other specific complaints in a complete review of systems (except as listed in HPI above).  Objective  Vitals:    01/20/21 0826  BP: 130/86  Pulse: 80  Resp: 16  Temp: 98 F (36.7 C)  TempSrc: Oral  SpO2: 98%  Weight: 185 lb (83.9 kg)  Height: 5\' 3"  (1.6 m)    Body mass index is 32.77 kg/m.  Physical Exam  Constitutional: Patient appears well-developed and well-nourished. Obese No distress.  HEENT: head atraumatic, normocephalic, pupils equal and reactive to light,  neck supple Cardiovascular: Normal rate, regular rhythm and normal heart sounds.  No murmur heard. No BLE edema. Pulmonary/Chest: Effort normal and breath sounds normal. No respiratory distress. Abdominal: Soft.  There is no tenderness. Muscular skeletal: left wrist scar well healed  Psychiatric: Patient has a normal mood and affect. behavior is normal. Judgment and thought content normal.  PHQ2/9: Depression screen [facility] 2/9 01/20/2021 07/27/2020 06/11/2020 12/12/2019 09/16/2019  Decreased Interest 0 0 0 0 0  Down, Depressed, Hopeless 0 0 0 0 0  PHQ - 2 Score 0 0 0 0 0  Altered sleeping - - 0 0 0  Tired, decreased energy - - 0 0 0  Change in appetite - - 0 0 0  Feeling bad or failure about yourself  - - 0 0 0  Trouble concentrating - - 0 0 0  Moving slowly or fidgety/restless - - 0 0 0  Suicidal thoughts - - 0 0 0  PHQ-9 Score - - 0 0 0  Difficult doing work/chores - - - - -    phq 9 is negative   Fall Risk: Fall Risk  01/20/2021 07/27/2020 06/11/2020 12/12/2019 07/02/2019  Falls in the past year? 0 0 0 0 0  Number falls in past yr: 0 0 0 0 0  Injury with Fall? 0 0 0 0 0  Follow up - Falls evaluation completed - - -     Functional Status Survey: Is the patient deaf or have difficulty hearing?: No Does the patient have difficulty seeing, even when wearing glasses/contacts?: No Does the patient have difficulty concentrating, remembering, or making decisions?: No Does the patient have difficulty walking or climbing stairs?: No Does the patient have difficulty dressing or bathing?: No Does the patient have difficulty doing  errands alone such as visiting a doctor's office or shopping?: No   Assessment & Plan  1. Dyslipidemia  - COMPLETE METABOLIC PANEL WITH GFR - Lipid panel  2. Vitamin D deficiency  - VITAMIN D 25 Hydroxy (Vit-D Deficiency, Fractures)  3. B12 deficiency   4. Perennial allergic rhinitis  - levocetirizine (XYZAL) 5 MG tablet; Take 1 tablet (5 mg total) by mouth every evening.  Dispense: 90 tablet; Refill: 1  5. Moderate persistent asthma without complication  - Fluticasone-Salmeterol (ADVAIR DISKUS) 250-50 MCG/DOSE AEPB; Inhale 1 puff into the lungs 2 (two) times daily.  Dispense: 60 each; Refill: 5  6. Intermittent low back pain   7. Bulging lumbar disc   8. Long-term use of high-risk medication  - COMPLETE METABOLIC PANEL WITH GFR

## 2021-01-20 ENCOUNTER — Ambulatory Visit: Payer: BC Managed Care – PPO | Admitting: Family Medicine

## 2021-01-20 ENCOUNTER — Other Ambulatory Visit: Payer: Self-pay

## 2021-01-20 ENCOUNTER — Encounter: Payer: Self-pay | Admitting: Family Medicine

## 2021-01-20 VITALS — BP 130/86 | HR 80 | Temp 98.0°F | Resp 16 | Ht 63.0 in | Wt 185.0 lb

## 2021-01-20 DIAGNOSIS — E785 Hyperlipidemia, unspecified: Secondary | ICD-10-CM | POA: Diagnosis not present

## 2021-01-20 DIAGNOSIS — J4540 Moderate persistent asthma, uncomplicated: Secondary | ICD-10-CM

## 2021-01-20 DIAGNOSIS — J3089 Other allergic rhinitis: Secondary | ICD-10-CM

## 2021-01-20 DIAGNOSIS — M5136 Other intervertebral disc degeneration, lumbar region: Secondary | ICD-10-CM

## 2021-01-20 DIAGNOSIS — E538 Deficiency of other specified B group vitamins: Secondary | ICD-10-CM

## 2021-01-20 DIAGNOSIS — Z79899 Other long term (current) drug therapy: Secondary | ICD-10-CM

## 2021-01-20 DIAGNOSIS — R439 Unspecified disturbances of smell and taste: Secondary | ICD-10-CM

## 2021-01-20 DIAGNOSIS — U099 Post covid-19 condition, unspecified: Secondary | ICD-10-CM

## 2021-01-20 DIAGNOSIS — M545 Low back pain, unspecified: Secondary | ICD-10-CM

## 2021-01-20 DIAGNOSIS — E559 Vitamin D deficiency, unspecified: Secondary | ICD-10-CM | POA: Diagnosis not present

## 2021-01-20 DIAGNOSIS — M5126 Other intervertebral disc displacement, lumbar region: Secondary | ICD-10-CM

## 2021-01-20 MED ORDER — LEVOCETIRIZINE DIHYDROCHLORIDE 5 MG PO TABS: 5.0000 mg | ORAL_TABLET | Freq: Every evening | ORAL | 1 refills | Status: DC

## 2021-01-20 MED ORDER — ROSUVASTATIN CALCIUM 10 MG PO TABS
10.0000 mg | ORAL_TABLET | Freq: Every day | ORAL | 1 refills | Status: DC
Start: 2021-01-20 — End: 2021-07-25

## 2021-01-20 MED ORDER — FLUTICASONE-SALMETEROL 250-50 MCG/DOSE IN AEPB: 1.0000 | INHALATION_SPRAY | Freq: Two times a day (BID) | RESPIRATORY_TRACT | 5 refills | Status: DC

## 2021-01-21 LAB — LIPID PANEL
Cholesterol: 147 mg/dL (ref <200)
HDL: 59 mg/dL (ref >=50)
LDL Cholesterol (Calc): 72 mg/dL (calc)
Non-HDL Cholesterol (Calc): 88 mg/dL (calc) (ref <130)
Total CHOL/HDL Ratio: 2.5 (calc) (ref <5.0)
Triglycerides: 84 mg/dL (ref <150)

## 2021-01-21 LAB — COMPLETE METABOLIC PANEL WITH GFR
AG Ratio: 1.8 (calc) (ref 1.0–2.5)
ALT: 17 U/L (ref 6–29)
AST: 14 U/L (ref 10–35)
Albumin: 4.4 g/dL (ref 3.6–5.1)
Alkaline phosphatase (APISO): 66 U/L (ref 37–153)
BUN: 17 mg/dL (ref 7–25)
CO2: 30 mmol/L (ref 20–32)
Calcium: 9.7 mg/dL (ref 8.6–10.4)
Chloride: 103 mmol/L (ref 98–110)
Creat: 0.72 mg/dL (ref 0.50–1.05)
GFR, Est African American: 110 mL/min/{1.73_m2} (ref >=60)
GFR, Est Non African American: 95 mL/min/{1.73_m2} (ref >=60)
Globulin: 2.4 g/dL (calc) (ref 1.9–3.7)
Glucose, Bld: 105 mg/dL — ABNORMAL HIGH (ref 65–99)
Potassium: 4.6 mmol/L (ref 3.5–5.3)
Sodium: 141 mmol/L (ref 135–146)
Total Bilirubin: 0.9 mg/dL (ref 0.2–1.2)
Total Protein: 6.8 g/dL (ref 6.1–8.1)

## 2021-01-21 LAB — VITAMIN D 25 HYDROXY (VIT D DEFICIENCY, FRACTURES): Vit D, 25-Hydroxy: 41 ng/mL (ref 30–100)

## 2021-01-24 ENCOUNTER — Encounter: Payer: Self-pay | Admitting: Family Medicine

## 2021-01-31 ENCOUNTER — Other Ambulatory Visit: Payer: Self-pay

## 2021-01-31 ENCOUNTER — Ambulatory Visit
Admission: RE | Admit: 2021-01-31 | Discharge: 2021-01-31 | Disposition: A | Discharge status: Home / Self Care | Payer: BC Managed Care – PPO | Source: Ambulatory Visit | Attending: Obstetrics and Gynecology | Admitting: Obstetrics and Gynecology

## 2021-01-31 DIAGNOSIS — Z1231 Encounter for screening mammogram for malignant neoplasm of breast: Secondary | ICD-10-CM | POA: Insufficient documentation

## 2021-02-23 ENCOUNTER — Telehealth: Payer: Self-pay

## 2021-02-23 ENCOUNTER — Encounter: Payer: Self-pay | Admitting: Family Medicine

## 2021-02-23 NOTE — Telephone Encounter (Signed)
Copied from [place] [id number]. Topic: General - Inquiry >> Feb 23, 2021  9:15 AM [name] wrote: Reason for CRM: Pt has request a FU call from Dr Ancil Boozer or nurse, she has tested positive for covid and has asmatha and is not vaccinated and and is panicked. She would like a cb as to be proactive due to her Cathy Gray, [phone]

## 2021-02-24 ENCOUNTER — Telehealth (INDEPENDENT_AMBULATORY_CARE_PROVIDER_SITE_OTHER): Payer: BC Managed Care – PPO | Admitting: Family Medicine

## 2021-02-24 ENCOUNTER — Encounter: Payer: Self-pay | Admitting: Family Medicine

## 2021-02-24 VITALS — Ht 62.0 in | Wt 185.0 lb

## 2021-02-24 DIAGNOSIS — U071 COVID-19: Secondary | ICD-10-CM

## 2021-02-24 MED ORDER — PREDNISONE 20 MG PO TABS
ORAL_TABLET | ORAL | 0 refills | Status: AC
Start: 2021-02-24 — End: 2021-03-01

## 2021-02-24 MED ORDER — MOLNUPIRAVIR EUA 200MG CAPSULE
4.0000 | ORAL_CAPSULE | Freq: Two times a day (BID) | ORAL | 0 refills | Status: AC
Start: 2021-02-24 — End: 2021-03-01

## 2021-02-24 NOTE — Progress Notes (Signed)
Virtual Visit Note  I connected with patient on 02/24/21 at 1014am by Epic video visit and verified that I am speaking with the correct person using two identifiers. Cathy Gray is currently located at home and no family members are currently with them during visit. The provider, Laurita Quint Giavanna Kang, FNP is located in their office at time of visit.  I discussed the limitations, risks, security and privacy concerns of performing an evaluation and management service by telephone and the availability of in person appointments. I also discussed with the patient that there may be a patient responsible charge related to this service. The patient expressed understanding and agreed to proceed.   Chief Complaint  Patient presents with  . Covid Positive    With at home test, patient does have asthma did not get any vaccines.  Has cough and runny nose    HPI ? Saturday was visiting parents in [place] Had a runny nose at that time Took a home test that day and was negative Does have a history of allergies Was feeling better while she was there Got back Tuesday Got called from her dad yesterday that he had tested positive She tested yesterday and it had came back positive Has not lost taste or smell Cathy Gray has the symptoms of a head cold Runny nose, coughing at night Has been taking asthma medications Denies fever or body aches Is not vaccinated   Allergies  Allergen Reactions  . Nitrofurantoin Anaphylaxis  . Amoxicillin     rash  . Gabapentin   . Molds & Smuts   . Montelukast Sodium     tachycardia  . Nitrofurantoin Monohyd Macro Diarrhea, Itching and Nausea Only    Other reaction(s): Abdominal pain, Dizziness, Unconsciousness  . Penicillin V Potassium Other (See Comments)  . Pollen Extract     Prior to Admission medications   Medication Sig Start Date End Date Taking? Authorizing Provider  albuterol (VENTOLIN HFA) 108 (90 Base) MCG/ACT inhaler TAKE 2 PUFFS BY MOUTH EVERY 6 HOURS  AS NEEDED FOR WHEEZE OR SHORTNESS OF BREATH 10/27/19  Yes Sowles, Drue Stager, MD  Ascorbic Acid (VITAMIN C) 1000 MG tablet Take 1,000 mg by mouth daily.   Yes [provider]  Cholecalciferol (VITAMIN D) 50 MCG (2000 UT) CAPS Take 2,000 Units by mouth daily.   Yes [provider]  cyclobenzaprine (FLEXERIL) 5 MG tablet Take 1 tablet (5 mg total) by mouth 3 (three) times daily as needed. 04/11/19  Yes Sowles, Drue Stager, MD  Fluticasone-Salmeterol (ADVAIR DISKUS) 250-50 MCG/DOSE AEPB Inhale 1 puff into the lungs 2 (two) times daily. 01/20/21  Yes Sowles, Drue Stager, MD  levocetirizine (XYZAL) 5 MG tablet Take 1 tablet (5 mg total) by mouth every evening. 01/20/21  Yes Sowles, Drue Stager, MD  meloxicam (MOBIC) 15 MG tablet TAKE 1 TABLET BY MOUTH EVERY DAY 07/03/19  Yes Steele Sizer, MD  Pediatric Multivit-Minerals-C (GUMMI BEAR MULTIVITAMIN/MIN) CHEW Chew 2 tablets by mouth daily.   Yes [provider]  rosuvastatin (CRESTOR) 10 MG tablet Take 1 tablet (10 mg total) by mouth daily. 01/20/21  Yes Sowles, Drue Stager, MD  zinc gluconate 50 MG tablet Take 50 mg by mouth daily.   Yes [provider]    Past Medical History:  Diagnosis Date  . Allergy   . Asthma   . Bilateral cataracts   . Breast mass, left 12/07/2015  . Chronic sciatica of left side   . Colicky right upper quadrant pain   . Fever blister   .  Hay fever   . Over weight     Past Surgical History:  Procedure Laterality Date  . ABDOMINAL HYSTERECTOMY    . BLADDER SUSPENSION  2007  . CESAREAN SECTION  2001  . COLONOSCOPY WITH PROPOFOL N/A 08/13/2017   Procedure: COLONOSCOPY WITH PROPOFOL;  Surgeon: Jonathon Bellows, MD;  Location: [facility] ENDOSCOPY;  Service: Gastroenterology;  Laterality: N/A;  . DE QUERVAIN'S RELEASE Left 11/24/2020  . renal abscess  1994    Social History   Tobacco Use  . Smoking status: Never Smoker  . Smokeless tobacco: Never Used  Substance Use Topics  . Alcohol use: Yes    Alcohol/week:  0.0 standard drinks    Comment: occasionally    Family History  Problem Relation Age of Onset  . Heart disease Father   . Asthma Father   . Diverticulosis Father   . Cancer Sister        Thyroid  . Anxiety disorder Daughter   . OCD Daughter   . Hyperlipidemia Maternal Grandmother   . Diverticulosis Maternal Grandmother   . Diabetes Maternal Grandfather   . Asthma Paternal Grandmother   . Hyperlipidemia Paternal Grandmother   . Seizures Son   . Breast cancer Cousin 32       pat 1st     Review of Systems  All other systems reviewed and are negative.   Objective  Constitutional:      General: Not in acute distress.    Appearance: Normal appearance. Not ill-appearing.   Pulmonary:     Effort: Pulmonary effort is normal. No respiratory distress.  Neurological:     Mental Status: Alert and oriented to person, place, and time.  Psychiatric:        Mood and Affect: Mood normal.        Behavior: Behavior normal.     ASSESSMENT and PLAN  Problem List Items Addressed This Visit   None   Visit Diagnoses    COVID-19    -  Primary   Relevant Medications   predniSONE (DELTASONE) 20 MG tablet   molnupiravir EUA 200 mg CAPS      Plan . R/se/b of medications prescribed discussed . Continue conservative treatment with OTC regimens . RTC/ED precautions provided  Return if symptoms worsen or fail to improve.    The above assessment and management plan was discussed with the patient. The patient verbalized understanding of and has agreed to the management plan. Patient is aware to call the clinic if symptoms persist or worsen. Patient is aware when to return to the clinic for a follow-up visit. Patient educated on when it is appropriate to go to the emergency department.     Huston Foley Mellany Dinsmore, FNP-BC [facility]

## 2021-02-24 NOTE — Patient Instructions (Signed)

## 2021-03-14 ENCOUNTER — Other Ambulatory Visit: Payer: Self-pay | Admitting: Otolaryngology

## 2021-03-14 DIAGNOSIS — H918X9 Other specified hearing loss, unspecified ear: Secondary | ICD-10-CM

## 2021-03-14 DIAGNOSIS — H9312 Tinnitus, left ear: Secondary | ICD-10-CM

## 2021-03-23 ENCOUNTER — Ambulatory Visit
Admission: RE | Admit: 2021-03-23 | Discharge: 2021-03-23 | Disposition: A | Discharge status: Home / Self Care | Payer: BC Managed Care – PPO | Source: Ambulatory Visit | Attending: Otolaryngology | Admitting: Otolaryngology

## 2021-03-23 ENCOUNTER — Other Ambulatory Visit: Payer: Self-pay

## 2021-03-23 DIAGNOSIS — H9312 Tinnitus, left ear: Secondary | ICD-10-CM | POA: Insufficient documentation

## 2021-03-23 DIAGNOSIS — H918X9 Other specified hearing loss, unspecified ear: Secondary | ICD-10-CM | POA: Diagnosis not present

## 2021-03-23 MED ORDER — GADOBUTROL 1 MMOL/ML IV SOLN
7.5000 mL | Freq: Once | INTRAVENOUS | Status: AC | PRN
  Administered 2021-03-23: 7.5 mL via INTRAVENOUS

## 2021-05-31 NOTE — Progress Notes (Signed)
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 Cari Caraway. She will follow up with neurosurgeon yearly and is going back to Dr. Pryor Ochoa to manage the left ear tinnitus possible Meniere's disease  Morbid obesity: BMI above 35 with dyslipidemia and DDD spine, she would benefit from medications. Weight is gradually going up even though she eats a balanced diet. She has tried [employer], [employer], [employer] , 21 day fix diet helped her lose 30 lbs a long time ago but when she stopped she gained it right back.   Diet: enough calcium, eats balanced diets, lost of salads and fish  Exercise: she is active around her house, but no regular activity, discussed 150 minutes per week    [place] Visit from 06/11/2020 in [facility]  AUDIT-C Score 0      Depression: Phq 9 is  negative Depression screen [facility] 2/9 06/01/2021 02/24/2021 01/20/2021 07/27/2020 06/11/2020  Decreased Interest 0 0 0 0 0  Down, Depressed, Hopeless 0 0 0 0 0  PHQ - 2 Score 0 0 0 0 0  Altered sleeping - - - - 0  Tired, decreased energy - - - - 0  Change in appetite - - - - 0  Feeling bad or failure about yourself  - - - - 0  Trouble concentrating - - - - 0  Moving slowly or fidgety/restless - - - - 0  Suicidal thoughts - - - - 0  PHQ-9 Score - - - - 0  Difficult doing work/chores - - - - -   Hypertension: BP Readings from Last 3 Encounters:  06/01/21 120/84  01/20/21 130/86  07/27/20 124/80   Obesity: Wt Readings from Last 3 Encounters:  06/01/21 193 lb (87.5 kg)  02/24/21 185 lb (83.9 kg)  01/20/21 185 lb (83.9 kg)   BMI Readings from Last 3 Encounters:  06/01/21 35.30  kg/m  02/24/21 33.84 kg/m  01/20/21 32.77 kg/m     Vaccines:   Shingrix: today  Pneumonia: educated and discussed with patient. Flu: educated and discussed with patient.  Hep C Screening: 06/11/20 STD testing and prevention (HIV/chl/gon/syphilis): N/A Intimate partner violence:negative Sexual History :no pain during sex  Menstrual History/LMP/Abnormal Bleeding: discussed post-menopausal bleeding  Incontinence Symptoms: no problems   Breast cancer:  - Last Mammogram: 01/31/21 - BRCA gene screening: N/A  Osteoporosis: Discussed high calcium and vitamin D supplementation, weight bearing exercises  Cervical cancer screening: 01/09/19  Skin cancer: Discussed monitoring for atypical lesions - sees Dr. Evorn Gong  Colorectal cancer: 08/13/17   Lung cancer:  Low Dose CT Chest recommended if Age 55-80 years, 20 pack-year currently smoking OR have quit w/in 15years. Patient does not qualify.   ECG: 12/09/11  Advanced Care Planning: A voluntary discussion about advance care planning including the explanation and discussion of advance directives.  Discussed health care proxy and Living will, and the patient was able to identify a health care proxy as husband .  Lipids: Lab Results  Component Value Date   CHOL 147 01/20/2021   CHOL 281 (H) 06/11/2020   CHOL 226 (H) 07/02/2019   Lab Results  Component Value Date   HDL 59 01/20/2021   HDL 63 06/11/2020   HDL 64 07/02/2019   Lab Results  Component Value Date   LDLCALC 72 01/20/2021   LDLCALC 187 (H) 06/11/2020   LDLCALC 132 (H) 07/02/2019   Lab Results  Component Value Date   TRIG 84 01/20/2021   TRIG 166 (H) 06/11/2020   TRIG 166 (H) 07/02/2019   Lab Results  Component Value Date   CHOLHDL 2.5 01/20/2021   CHOLHDL 4.5 06/11/2020   CHOLHDL 3.5 07/02/2019   No results found for: LDLDIRECT  Glucose: Glucose  Date Value Ref Range Status  12/09/2011 145 (H) 65 - 99 mg/dL Final   Glucose, Bld  Date Value Ref Range Status   01/20/2021 105 (H) 65 - 99 mg/dL Final    Comment:    .            Fasting reference interval . For someone without known diabetes, a glucose value between 100 and 125 mg/dL is consistent with prediabetes and should be confirmed with a follow-up test. .   06/11/2020 98 65 - 99 mg/dL Final    Comment:    .            Fasting reference interval .   07/02/2019 87 65 - 99 mg/dL Final    Comment:    .            Fasting reference interval .     Patient Active Problem List   Diagnosis Date Noted   Strain of extensor muscle, fascia and tendon of left index finger at wrist and hand level, initial encounter 08/03/2020   Ganglion cyst 08/03/2020   Trochanteric bursitis of right hip 07/27/2020   History of COVID-19 09/13/2019   Plantar fasciitis of left foot 09/30/2018   Bulging lumbar disc 05/14/2018   Hypercholesteremia 04/18/2018   Hyperglycemia 04/18/2018   Vitamin D deficiency 04/18/2018   B12 deficiency 04/18/2018   History of recent fall 09/10/2017   Obesity (BMI 30.0-34.9) 05/10/2017   Asthma, moderate 07/30/2015   Bilateral cataracts 07/30/2015   Neuralgia neuritis, sciatic nerve 07/30/2015   Recurrent UTI 07/30/2015    Past Surgical History:  Procedure Laterality Date   ABDOMINAL HYSTERECTOMY     BLADDER SUSPENSION  2007   CESAREAN SECTION  2001   COLONOSCOPY WITH PROPOFOL N/A 08/13/2017   Procedure: COLONOSCOPY WITH PROPOFOL;  Surgeon: Wyline Mood, MD;  Location: [facility] ENDOSCOPY;  Service: Gastroenterology;  Laterality: N/A;   DE QUERVAIN'S RELEASE Left 11/24/2020   renal abscess  1994    Family History  Problem Relation Age of Onset   Heart disease Father    Asthma Father    Diverticulosis Father    Cancer Sister        Thyroid   Anxiety disorder Daughter    OCD Daughter    Hyperlipidemia Maternal Grandmother    Diverticulosis Maternal Grandmother    Diabetes Maternal Grandfather    Asthma Paternal Grandmother    Hyperlipidemia Paternal  Grandmother    Seizures Son    Breast cancer Cousin 32       pat 1st     Social History   Socioeconomic History   Marital status: Married    Spouse name: Scot   Number of children: 3   Years of education: Not on file   Highest education level: Some college, no degree  Occupational History   Not on file  Tobacco Use   Smoking status: Never  Smokeless tobacco: Never  Vaping Use   Vaping Use: Never used  Substance and Sexual Activity   Alcohol use: Yes    Alcohol/week: 0.0 standard drinks    Comment: occasionally   Drug use: No   Sexual activity: Yes    Partners: Male    Birth control/protection: Other-see comments    Comment: Hysterectomy  Other Topics Concern   Not on file  Social History Narrative   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   Social Determinants of Health   Financial Resource Strain: Low Risk    Difficulty of Paying Living Expenses: Not hard at all  Food Insecurity: No Food Insecurity   Worried About Charity fundraiser in the Last Year: Never true   [place] in the Last Year: Never true  Transportation Needs: No Transportation Needs   Lack of Transportation (Medical): No   Lack of Transportation (Non-Medical): No  Physical Activity: Insufficiently Active   Days of Exercise per Week: 3 days   Minutes of Exercise per Session: 30 min  Stress: No Stress Concern Present   Feeling of Stress : Not at all  Social Connections: Moderately Integrated   Frequency of Communication with Friends and Family: More than three times a week   Frequency of Social Gatherings with Friends and Family: Twice a week   Attends Religious Services: Never   Marine scientist or Organizations: Yes   Attends Music therapist: 1 to 4 times per year   Marital Status: Married  Human resources officer Violence: Not At Risk   Fear of Current or Ex-Partner: No   Emotionally Abused: No   Physically Abused: No   Sexually Abused: No      Current Outpatient Medications:    albuterol (VENTOLIN HFA) 108 (90 Base) MCG/ACT inhaler, TAKE 2 PUFFS BY MOUTH EVERY 6 HOURS AS NEEDED FOR WHEEZE OR SHORTNESS OF BREATH, Disp: 18 g, Rfl: 0   Ascorbic Acid (VITAMIN C) 1000 MG tablet, Take 1,000 mg by mouth daily., Disp: , Rfl:    Cholecalciferol (VITAMIN D) 50 MCG (2000 UT) CAPS, Take 2,000 Units by mouth daily., Disp: , Rfl:    cyclobenzaprine (FLEXERIL) 5 MG tablet, Take 1 tablet (5 mg total) by mouth 3 (three) times daily as needed., Disp: 90 tablet, Rfl: 0   Fluticasone-Salmeterol (ADVAIR DISKUS) 250-50 MCG/DOSE AEPB, Inhale 1 puff into the lungs 2 (two) times daily., Disp: 60 each, Rfl: 5   levocetirizine (XYZAL) 5 MG tablet, Take 1 tablet (5 mg total) by mouth every evening., Disp: 90 tablet, Rfl: 1   meloxicam (MOBIC) 15 MG tablet, TAKE 1 TABLET BY MOUTH EVERY DAY, Disp: 90 tablet, Rfl: 0   Pediatric Multivit-Minerals-C (GUMMI BEAR MULTIVITAMIN/MIN) CHEW, Chew 2 tablets by mouth daily., Disp: , Rfl:    rosuvastatin (CRESTOR) 10 MG tablet, Take 1 tablet (10 mg total) by mouth daily., Disp: 90 tablet, Rfl: 1   zinc gluconate 50 MG tablet, Take 50 mg by mouth daily., Disp: , Rfl:   Allergies  Allergen Reactions   Nitrofurantoin Anaphylaxis   Amoxicillin     rash   Gabapentin    Molds & Smuts    Montelukast Sodium     tachycardia   Nitrofurantoin Monohyd Macro Diarrhea, Itching and Nausea Only    Other reaction(s): Abdominal pain, Dizziness, Unconsciousness   Penicillin V Potassium Other (See Comments)   Pollen Extract      ROS  Constitutional:  Negative for fever, positive for  weight change.  Respiratory: Negative for cough and shortness of breath.   Cardiovascular: Negative for chest pain or palpitations.  Gastrointestinal: Negative for abdominal pain, no bowel changes.  Musculoskeletal: Negative for gait problem or joint swelling.  Skin: Negative for rash.  Neurological: Negative for dizziness or headache.  No  other specific complaints in a complete review of systems (except as listed in HPI above).   Objective  Vitals:   06/01/21 0909  BP: 120/84  Pulse: 82  Resp: 16  Temp: 98 F (36.7 C)  SpO2: 99%  Weight: 193 lb (87.5 kg)  Height: $Remove'5\' 2"'bCtpoCo$  (1.575 m)    Body mass index is 35.3 kg/m.  Physical Exam  Constitutional: Patient appears well-developed and well-nourished. No distress.  HENT: Head: Normocephalic and atraumatic. Ears: B TMs ok, no erythema or effusion; Nose: Nose normal. Mouth/Throat: not done  Eyes: Conjunctivae and EOM are normal. Pupils are equal, round, and reactive to light. No scleral icterus.  Neck: Normal range of motion. Neck supple. No JVD present. No thyromegaly present.  Cardiovascular: Normal rate, regular rhythm and normal heart sounds.  No murmur heard. No BLE edema. Pulmonary/Chest: Effort normal and breath sounds normal. No respiratory distress. Abdominal: Soft. Bowel sounds are normal, no distension. There is no tenderness. no masses Breast: no lumps or masses, no nipple discharge or rashes FEMALE GENITALIA:  Not done  RECTAL: not done  Musculoskeletal: Normal range of motion, no joint effusions. No gross deformities Neurological: he is alert and oriented to person, place, and time. No cranial nerve deficit. Coordination, balance, strength, speech and gait are normal.  Skin: Skin is warm and dry. No rash noted. No erythema.  Psychiatric: Patient has a normal mood and affect. behavior is normal. Judgment and thought content normal.   Fall Risk: Fall Risk  06/01/2021 02/24/2021 01/20/2021 07/27/2020 06/11/2020  Falls in the past year? 0 0 0 0 0  Number falls in past yr: 0 0 0 0 0  Injury with Fall? 0 0 0 0 0  Follow up - - - Falls evaluation completed -     Functional Status Survey: Is the patient deaf or have difficulty hearing?: No Does the patient have difficulty seeing, even when wearing glasses/contacts?: No Does the patient have difficulty  concentrating, remembering, or making decisions?: No Does the patient have difficulty walking or climbing stairs?: No Does the patient have difficulty dressing or bathing?: No Does the patient have difficulty doing errands alone such as visiting a doctor's office or shopping?: No   Assessment & Plan  1. Well adult exam   2. Need for shingles vaccine  - Varicella-zoster vaccine IM (Shingrix)  3. Morbid obesity ([place])  - Phentermine-Topiramate (QSYMIA) 3.75-23 MG CP24; Take 1 capsule by mouth every morning.  Dispense: 30 capsule; Refill: 0 - Phentermine-Topiramate (QSYMIA) 7.5-46 MG CP24; Take 1 capsule by mouth daily before breakfast. After you finish the 3.75-23 dose  Dispense: 30 capsule; Refill: 0  4. Chiari I malformation (HCC)    -USPSTF grade A and B recommendations reviewed with patient; age-appropriate recommendations, preventive care, screening tests, etc discussed and encouraged; healthy living encouraged; see AVS for patient education given to patient -Discussed importance of 150 minutes of physical activity weekly, eat two servings of fish weekly, eat one serving of tree nuts ( cashews, pistachios, pecans, almonds.Marland Kitchen) every other day, eat 6 servings of fruit/vegetables daily and drink plenty of water and avoid sweet beverages.   -Reviewed Health Maintenance

## 2021-05-31 NOTE — Patient Instructions (Signed)
Preventive Care 55-55 Years Old, Female Preventive care refers to lifestyle choices and visits with your health care provider that can promote health and wellness. This includes: A yearly physical exam. This is also called an annual wellness visit. Regular dental and eye exams. Immunizations. Screening for certain conditions. Healthy lifestyle choices, such as: Eating a healthy diet. Getting regular exercise. Not using drugs or products that contain nicotine and tobacco. Limiting alcohol use. What can I expect for my preventive care visit? Physical exam Your health care provider will check your: Height and weight. These may be used to calculate your BMI (body mass index). BMI is a measurement that tells if you are at a healthy weight. Heart rate and blood pressure. Body temperature. Skin for abnormal spots. Counseling Your health care provider may ask you questions about your: Past medical problems. Family's medical history. Alcohol, tobacco, and drug use. Emotional well-being. Home life and relationship well-being. Sexual activity. Diet, exercise, and sleep habits. Work and work Statistician. Access to firearms. Method of birth control. Menstrual cycle. Pregnancy history. What immunizations do I need?  Vaccines are usually given at various ages, according to a schedule. Your health care provider will recommend vaccines for you based on your age, medicalhistory, and lifestyle or other factors, such as travel or where you work. What tests do I need? Blood tests Lipid and cholesterol levels. These may be checked every 5 years, or more often if you are over 55 years old. Hepatitis C test. Hepatitis B test. Screening Lung cancer screening. You may have this screening every year starting at age 30 if you have a 30-pack-year history of smoking and currently smoke or have quit within the past 15 years. Colorectal cancer screening. All adults should have this screening starting at  age 55 and continuing until age 3. Your health care provider may recommend screening at age 55 if you are at increased risk. You will have tests every 1-10 years, depending on your results and the type of screening test. Diabetes screening. This is done by checking your blood sugar (glucose) after you have not eaten for a while (fasting). You may have this done every 1-3 years. Mammogram. This may be done every 1-2 years. Talk with your health care provider about when you should start having regular mammograms. This may depend on whether you have a family history of breast cancer. BRCA-related cancer screening. This may be done if you have a family history of breast, ovarian, tubal, or peritoneal cancers. Pelvic exam and Pap test. This may be done every 3 years starting at age 55. Starting at age 54, this may be done every 5 years if you have a Pap test in combination with an HPV test. Other tests STD (sexually transmitted disease) testing, if you are at risk. Bone density scan. This is done to screen for osteoporosis. You may have this scan if you are at high risk for osteoporosis. Talk with your health care provider about your test results, treatment options,and if necessary, the need for more tests. Follow these instructions at home: Eating and drinking  Eat a diet that includes fresh fruits and vegetables, whole grains, lean protein, and low-fat dairy products. Take vitamin and mineral supplements as recommended by your health care provider. Do not drink alcohol if: Your health care provider tells you not to drink. You are pregnant, may be pregnant, or are planning to become pregnant. If you drink alcohol: Limit how much you have to 0-1 drink a day. Be aware  of how much alcohol is in your drink. In the [place] one drink equals one 12 oz bottle of beer (355 mL), one 5 oz glass of wine (148 mL), or one 1 oz glass of hard liquor (44 mL).  Lifestyle Take daily care of your teeth and  gums. Brush your teeth every morning and night with fluoride toothpaste. Floss one time each day. Stay active. Exercise for at least 30 minutes 5 or more days each week. Do not use any products that contain nicotine or tobacco, such as cigarettes, e-cigarettes, and chewing tobacco. If you need help quitting, ask your health care provider. Do not use drugs. If you are sexually active, practice safe sex. Use a condom or other form of protection to prevent STIs (sexually transmitted infections). If you do not wish to become pregnant, use a form of birth control. If you plan to become pregnant, see your health care provider for a prepregnancy visit. If told by your health care provider, take low-dose aspirin daily starting at age 55. Find healthy ways to cope with stress, such as: Meditation, yoga, or listening to music. Journaling. Talking to a trusted person. Spending time with friends and family. Safety Always wear your seat belt while driving or riding in a vehicle. Do not drive: If you have been drinking alcohol. Do not ride with someone who has been drinking. When you are tired or distracted. While texting. Wear a helmet and other protective equipment during sports activities. If you have firearms in your house, make sure you follow all gun safety procedures. What's next? Visit your health care provider once a year for an annual wellness visit. Ask your health care provider how often you should have your eyes and teeth checked. Stay up to date on all vaccines. This information is not intended to replace advice given to you by your health care provider. Make sure you discuss any questions you have with your healthcare provider. Document Revised: 07/27/2020 Document Reviewed: 07/04/2018 Elsevier Patient Education  2022 [employer].

## 2021-06-01 ENCOUNTER — Other Ambulatory Visit: Payer: Self-pay

## 2021-06-01 ENCOUNTER — Encounter: Payer: Self-pay | Admitting: Family Medicine

## 2021-06-01 ENCOUNTER — Ambulatory Visit: Payer: BC Managed Care – PPO | Admitting: Family Medicine

## 2021-06-01 DIAGNOSIS — Z0000 Encounter for general adult medical examination without abnormal findings: Secondary | ICD-10-CM

## 2021-06-01 DIAGNOSIS — Z23 Encounter for immunization: Secondary | ICD-10-CM | POA: Diagnosis not present

## 2021-06-01 DIAGNOSIS — G935 Compression of brain: Secondary | ICD-10-CM | POA: Diagnosis not present

## 2021-06-01 MED ORDER — QSYMIA 7.5-46 MG PO CP24: 1.0000 | ORAL_CAPSULE | Freq: Every day | ORAL | 0 refills | Status: DC

## 2021-06-01 MED ORDER — QSYMIA 3.75-23 MG PO CP24: 1.0000 | ORAL_CAPSULE | ORAL | 0 refills | Status: DC

## 2021-06-13 ENCOUNTER — Ambulatory Visit: Payer: Self-pay | Admitting: *Deleted

## 2021-06-13 NOTE — Telephone Encounter (Signed)
Patient is calling to report she may have pulled muscle- she was helping to move flooring- she is having pain L to R bottom rib with some swelling- patient reports she has pain with deep breath. Patient did take muscle relaxer which helped with pain a little. No open appointment- does have virtual- but patient needs to be seen- she declines UC disposition.

## 2021-06-13 NOTE — Telephone Encounter (Signed)
Patient looking for advice, because its been a a few days and she says feel like lump under her breast , wanted some advice before trying to come in, also said it hurt to breath in but she took a muscle relaxer and that relieved it at the time  Reason for Disposition  [1] MODERATE pain (e.g., interferes with normal activities) AND [2] present > 3 days  Answer Assessment - Initial Assessment Questions 1. ONSET: "When did the muscle aches or body pains start?"      2-3 days ago 2. LOCATION: "What part of your body is hurting?" (e.g., entire body, arms, legs)      Runs under last L rib- to last rib on R side to back 3. SEVERITY: "How bad is the pain?" (Scale 1-10; or mild, moderate, severe)   - MILD (1-3): doesn't interfere with normal activities    - MODERATE (4-7): interferes with normal activities or awakens from sleep    - SEVERE (8-10):  excruciating pain, unable to do any normal activities      Yesterday- 7, today moderate- hurts to twist to R or take deep breath in 4. CAUSE: "What do you think is causing the pains?"     Possible pulled muscle 5. FEVER: "Have you been having fever?"     no 6. OTHER SYMPTOMS: "Do you have any other symptoms?" (e.g., chest pain, weakness, rash, cold or flu symptoms, weight loss)     Pain when takes deep breath 7. PREGNANCY: "Is there any chance you are pregnant?" "When was your last menstrual period?"     N/a 8. TRAVEL: "Have you traveled out of the country in the last month?" (e.g., travel history, exposures)     no  Protocols used: Muscle Aches and Body Pain-A-AH

## 2021-06-14 NOTE — Telephone Encounter (Signed)
Tried calling pt to schedule a virtual appt. Call kept dropping. I sent a Mychart message to pt to schedule

## 2021-06-17 ENCOUNTER — Telehealth: Payer: Self-pay | Admitting: Family Medicine

## 2021-06-17 NOTE — Telephone Encounter (Signed)
[facility] is calling because she will fax over medical clearance for HTZ Please advise CB- [phone]

## 2021-06-27 NOTE — Telephone Encounter (Signed)
[facility] is calling back again about this fax. Please advise

## 2021-06-27 NOTE — Telephone Encounter (Signed)
Called ENT left message on Nurse line to refax paper work or for [place] to call office when she get message

## 2021-06-29 ENCOUNTER — Encounter: Payer: Self-pay | Admitting: Family Medicine

## 2021-07-22 NOTE — Progress Notes (Signed)
Name: Cathy Gray   MRN: [id number]    DOB: [birth year]   Date:07/25/2021       Progress Note  Subjective  Chief Complaint  Follow Up  I connected with  Clabe Seal  on 07/25/21 at  7:40 AM EDT by telephone application and verified that I am speaking with the correct person using two identifiers.  I discussed the limitations of evaluation and management by telemedicine and the availability of in person appointments. The patient expressed understanding and agreed to proceed with the virtual visit  Staff also discussed with the patient that there may be a patient responsible charge related to this service. 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 Cari Caraway. She will follow up with neurosurgeon yearly and has seen Dr. Pryor Ochoa who prescribed HCTZ 25 but she was not able to tolerate dose, she states it made her feel dizzy, she is taking half dose every other day and will see him again on 09/23  Morbid obesity: BMI was  above 35 with dyslipidemia and DDD spine, She had tried [employer], [employer], [employer] , 21 day fix diet helped her lose 30 lbs a long time ago but when she stopped she gained it right back. We started her on Qsymia and she is tolerating medication well. Start weight was 193 lbs on 06/01/2021 and today it is 183 lbs. She states she has more energy, still avoid carbs, and now has also started exercising   Asthma: she does not like Wyxella and would like to try brand Advair, she states no cough, wheezing or SOB. She has not used rescue inhaler in a lot time  Chronic back pain with intermittent flares: her rx of Flexeril and Meloxicam has expired, she states she had to take a couple of doses a couple of weeks ago because she developed some spasms after a false step at the gym,  but doing well  now   Dyslipidemia: taking Crestor and denies side effects, last LDL was down to 72, continue medication  Patient Active Problem List   Diagnosis Date Noted   Strain of extensor muscle, fascia and tendon of left index finger at wrist and hand level, initial encounter 08/03/2020   Ganglion cyst 08/03/2020   Trochanteric bursitis of right hip 07/27/2020   History of COVID-19 09/13/2019   Plantar fasciitis of left foot 09/30/2018   Bulging lumbar disc 05/14/2018   Hypercholesteremia 04/18/2018   Hyperglycemia 04/18/2018   Vitamin D deficiency 04/18/2018   B12 deficiency 04/18/2018   History of recent fall 09/10/2017   Obesity (BMI 30.0-34.9) 05/10/2017   Asthma, moderate 07/30/2015   Bilateral cataracts 07/30/2015   Neuralgia neuritis, sciatic nerve 07/30/2015   Recurrent UTI 07/30/2015    Past Surgical History:  Procedure Laterality Date   ABDOMINAL HYSTERECTOMY     BLADDER SUSPENSION  2007   CESAREAN SECTION  2001   COLONOSCOPY WITH PROPOFOL N/A 08/13/2017   Procedure: COLONOSCOPY WITH PROPOFOL;  Surgeon: Jonathon Bellows, MD;  Location: [facility] ENDOSCOPY;  Service: Gastroenterology;  Laterality: N/A;   DE QUERVAIN'S RELEASE Left 11/24/2020   renal abscess  1994    Family History  Problem Relation Age of Onset   Heart disease Father    Asthma Father    Diverticulosis Father    Cancer Sister  Thyroid   Anxiety disorder Daughter    OCD Daughter    Hyperlipidemia Maternal Grandmother    Diverticulosis Maternal Grandmother    Diabetes Maternal Grandfather    Asthma Paternal Grandmother    Hyperlipidemia Paternal Grandmother    Seizures Son    Breast cancer Cousin 62       pat 1st     Social History   Socioeconomic History   Marital status: Married    Spouse name: Scot   Number of children: 3   Years of education: Not on file   Highest education level: Some college, no degree  Occupational History   Not on file  Tobacco Use   Smoking status: Never   Smokeless  tobacco: Never  Vaping Use   Vaping Use: Never used  Substance and Sexual Activity   Alcohol use: Yes    Alcohol/week: 0.0 standard drinks    Comment: occasionally   Drug use: No   Sexual activity: Yes    Partners: Male    Birth control/protection: Other-see comments    Comment: Hysterectomy  Other Topics Concern   Not on file  Social History Narrative   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   Social Determinants of Health   Financial Resource Strain: Low Risk    Difficulty of Paying Living Expenses: Not hard at all  Food Insecurity: No Food Insecurity   Worried About Charity fundraiser in the Last Year: Never true   [place] in the Last Year: Never true  Transportation Needs: No Transportation Needs   Lack of Transportation (Medical): No   Lack of Transportation (Non-Medical): No  Physical Activity: Insufficiently Active   Days of Exercise per Week: 3 days   Minutes of Exercise per Session: 30 min  Stress: No Stress Concern Present   Feeling of Stress : Not at all  Social Connections: Moderately Integrated   Frequency of Communication with Friends and Family: More than three times a week   Frequency of Social Gatherings with Friends and Family: Twice a week   Attends Religious Services: Never   Marine scientist or Organizations: Yes   Attends Music therapist: 1 to 4 times per year   Marital Status: Married  Human resources officer Violence: Not At Risk   Fear of Current or Ex-Partner: No   Emotionally Abused: No   Physically Abused: No   Sexually Abused: No     Current Outpatient Medications:    albuterol (VENTOLIN HFA) 108 (90 Base) MCG/ACT inhaler, TAKE 2 PUFFS BY MOUTH EVERY 6 HOURS AS NEEDED FOR WHEEZE OR SHORTNESS OF BREATH, Disp: 18 g, Rfl: 0   Ascorbic Acid (VITAMIN C) 1000 MG tablet, Take 1,000 mg by mouth daily., Disp: , Rfl:    Cholecalciferol (VITAMIN D) 50 MCG (2000 UT) CAPS, Take 2,000 Units by mouth  daily., Disp: , Rfl:    cyclobenzaprine (FLEXERIL) 5 MG tablet, Take 1 tablet (5 mg total) by mouth 3 (three) times daily as needed., Disp: 90 tablet, Rfl: 0   Fluticasone-Salmeterol (ADVAIR DISKUS) 250-50 MCG/DOSE AEPB, Inhale 1 puff into the lungs 2 (two) times daily., Disp: 60 each, Rfl: 5   hydrochlorothiazide (HYDRODIURIL) 25 MG tablet, Take 25 mg by mouth daily., Disp: , Rfl:    levocetirizine (XYZAL) 5 MG tablet, Take 1 tablet (5 mg total) by mouth every evening., Disp: 90 tablet, Rfl: 1   meloxicam (MOBIC) 15 MG tablet, TAKE 1  TABLET BY MOUTH EVERY DAY, Disp: 90 tablet, Rfl: 0   Pediatric Multivit-Minerals-C (GUMMI BEAR MULTIVITAMIN/MIN) CHEW, Chew 2 tablets by mouth daily., Disp: , Rfl:    Phentermine-Topiramate (QSYMIA) 3.75-23 MG CP24, Take 1 capsule by mouth every morning., Disp: 30 capsule, Rfl: 0   rosuvastatin (CRESTOR) 10 MG tablet, Take 1 tablet (10 mg total) by mouth daily., Disp: 90 tablet, Rfl: 1   zinc gluconate 50 MG tablet, Take 50 mg by mouth daily., Disp: , Rfl:    Phentermine-Topiramate (QSYMIA) 7.5-46 MG CP24, Take 1 capsule by mouth daily before breakfast. After you finish the 3.75-23 dose (Patient not taking: Reported on 07/25/2021), Disp: 30 capsule, Rfl: 0  Allergies  Allergen Reactions   Nitrofurantoin Anaphylaxis   Amoxicillin     rash   Gabapentin    Molds & Smuts    Montelukast Sodium     tachycardia   Nitrofurantoin Monohyd Macro Diarrhea, Itching and Nausea Only    Other reaction(s): Abdominal pain, Dizziness, Unconsciousness   Penicillin V Potassium Other (See Comments)   Pollen Extract     I personally reviewed active problem list, medication list, allergies, family history, social history, health maintenance with the patient/caregiver today.   ROS  Ten systems reviewed and is negative except as mentioned in HPI    Objective  Virtual encounter, but patient was at the office  Today's Vitals   07/25/21 0742  BP: 130/82  Pulse: 100  Resp:  16  Temp: 98.1 F (36.7 C)  SpO2: 98%  Weight: 182 lb (82.6 kg)  Height: '5\' 2"'$  (1.575 m)   Body mass index is 33.29 kg/m.   Body mass index is 33.29 kg/m.  Physical Exam  Awake, alert and oriented   PHQ2/9: Depression screen [facility] 2/9 07/25/2021 06/01/2021 02/24/2021 01/20/2021 07/27/2020  Decreased Interest 0 0 0 0 0  Down, Depressed, Hopeless 0 0 0 0 0  PHQ - 2 Score 0 0 0 0 0  Altered sleeping - - - - -  Tired, decreased energy - - - - -  Change in appetite - - - - -  Feeling bad or failure about yourself  - - - - -  Trouble concentrating - - - - -  Moving slowly or fidgety/restless - - - - -  Suicidal thoughts - - - - -  PHQ-9 Score - - - - -  Difficult doing work/chores - - - - -  Some recent data might be hidden   PHQ-2/9 Result is negative.    Fall Risk: Fall Risk  07/25/2021 06/01/2021 02/24/2021 01/20/2021 07/27/2020  Falls in the past year? 0 0 0 0 0  Number falls in past yr: 0 0 0 0 0  Injury with Fall? 0 0 0 0 0  Risk for fall due to : No Fall Risks - - - -  Follow up Falls prevention discussed - - - Falls evaluation completed     Assessment & Plan  1. Chiari I malformation ([place])  Seeing Dr. Cari Caraway yearly   2. Morbid obesity ([place])  Doing well, lost 10 lbs since started medication July 2022 - Phentermine-Topiramate (QSYMIA) 7.5-46 MG CP24; Take 1 capsule by mouth daily before breakfast. After you finish the 3.75-23 dose  Dispense: 30 capsule; Refill: 2  3. B12 deficiency   4. Dyslipidemia  - rosuvastatin (CRESTOR) 10 MG tablet; Take 1 tablet (10 mg total) by mouth daily.  Dispense: 90 tablet; Refill: 1  5. Moderate persistent asthma without complication  -  ADVAIR DISKUS 250-50 MCG/ACT AEPB; Inhale 1 puff into the lungs in the morning and at bedtime.  Dispense: 60 each; Refill: 5  6. Chronic left-sided low back pain with left-sided sciatica  - cyclobenzaprine (FLEXERIL) 5 MG tablet; Take 1 tablet (5 mg total) by mouth 3 (three) times daily as  needed.  Dispense: 30 tablet; Refill: 0 - meloxicam (MOBIC) 15 MG tablet; Take 1 tablet (15 mg total) by mouth daily.  Dispense: 30 tablet; Refill: 0  7. Perennial allergic rhinitis  - levocetirizine (XYZAL) 5 MG tablet; Take 1 tablet (5 mg total) by mouth every evening.  Dispense: 90 tablet; Refill: 1  8. Tinnitus, left ear  - hydrochlorothiazide (HYDRODIURIL) 25 MG tablet; Take 25 mg by mouth daily.   I discussed the assessment and treatment plan with the patient. The patient was provided an opportunity to ask questions and all were answered. The patient agreed with the plan and demonstrated an understanding of the instructions.  The patient was advised to call back or seek an in-person evaluation if the symptoms worsen or if the condition fails to improve as anticipated.  I provided 25 minutes of non-face-to-face time during this encounter.

## 2021-07-25 ENCOUNTER — Encounter: Payer: Self-pay | Admitting: Family Medicine

## 2021-07-25 ENCOUNTER — Ambulatory Visit: Payer: BC Managed Care – PPO | Admitting: Family Medicine

## 2021-07-25 ENCOUNTER — Other Ambulatory Visit: Payer: Self-pay

## 2021-07-25 VITALS — BP 130/82 | HR 100 | Temp 98.1°F | Resp 16 | Ht 62.0 in | Wt 182.0 lb

## 2021-07-25 DIAGNOSIS — E785 Hyperlipidemia, unspecified: Secondary | ICD-10-CM | POA: Diagnosis not present

## 2021-07-25 DIAGNOSIS — H9312 Tinnitus, left ear: Secondary | ICD-10-CM

## 2021-07-25 DIAGNOSIS — E538 Deficiency of other specified B group vitamins: Secondary | ICD-10-CM | POA: Diagnosis not present

## 2021-07-25 DIAGNOSIS — G935 Compression of brain: Secondary | ICD-10-CM

## 2021-07-25 DIAGNOSIS — J3089 Other allergic rhinitis: Secondary | ICD-10-CM

## 2021-07-25 DIAGNOSIS — M5442 Lumbago with sciatica, left side: Secondary | ICD-10-CM

## 2021-07-25 DIAGNOSIS — J4540 Moderate persistent asthma, uncomplicated: Secondary | ICD-10-CM

## 2021-07-25 DIAGNOSIS — G8929 Other chronic pain: Secondary | ICD-10-CM

## 2021-07-25 MED ORDER — LEVOCETIRIZINE DIHYDROCHLORIDE 5 MG PO TABS: 5.0000 mg | ORAL_TABLET | Freq: Every evening | ORAL | 1 refills | Status: DC

## 2021-07-25 MED ORDER — CYCLOBENZAPRINE HCL 5 MG PO TABS: 5.0000 mg | ORAL_TABLET | Freq: Three times a day (TID) | ORAL | 0 refills | Status: DC | PRN

## 2021-07-25 MED ORDER — QSYMIA 7.5-46 MG PO CP24: 1.0000 | ORAL_CAPSULE | Freq: Every day | ORAL | 2 refills | Status: DC

## 2021-07-25 MED ORDER — ROSUVASTATIN CALCIUM 10 MG PO TABS: 10.0000 mg | ORAL_TABLET | Freq: Every day | ORAL | 1 refills | Status: DC

## 2021-07-25 MED ORDER — MELOXICAM 15 MG PO TABS: 15.0000 mg | ORAL_TABLET | Freq: Every day | ORAL | 0 refills | Status: DC

## 2021-07-25 MED ORDER — ADVAIR DISKUS 250-50 MCG/ACT IN AEPB: 1.0000 | INHALATION_SPRAY | Freq: Two times a day (BID) | RESPIRATORY_TRACT | 5 refills | Status: DC

## 2021-08-15 ENCOUNTER — Encounter: Payer: Self-pay | Admitting: Otolaryngology

## 2021-09-16 ENCOUNTER — Other Ambulatory Visit: Payer: Self-pay

## 2021-09-16 ENCOUNTER — Ambulatory Visit (INDEPENDENT_AMBULATORY_CARE_PROVIDER_SITE_OTHER): Payer: BC Managed Care – PPO | Admitting: Emergency Medicine

## 2021-09-16 DIAGNOSIS — Z23 Encounter for immunization: Secondary | ICD-10-CM

## 2021-10-18 ENCOUNTER — Other Ambulatory Visit: Payer: Self-pay | Admitting: Family Medicine

## 2021-10-18 NOTE — Telephone Encounter (Signed)
Requested medication (s) are due for refill today: yes  Requested medication (s) are on the active medication list: yes  Last refill:  07/25/21 #30/2RF  Future visit scheduled: yes  Notes to clinic:  Unable to refill per protocol, cannot delegate.      Requested Prescriptions  Pending Prescriptions Disp Refills   Phentermine-Topiramate (QSYMIA) 7.5-46 MG CP24 30 capsule 2    Sig: Take 1 capsule by mouth daily before breakfast. After you finish the 3.75-23 dose     Not Delegated - Neurology: Anticonvulsants - Controlled - phentermine / topiramate Failed - 10/18/2021 12:51 PM      Failed - This refill cannot be delegated      Passed - Cr in normal range and within 360 days    Creat  Date Value Ref Range Status  01/20/2021 0.72 0.50 - 1.05 mg/dL Final    Comment:    For patients >4 years of age, the reference limit for Creatinine is approximately 13% higher for people identified as African-American. .           Passed - CO2 in normal range and within 360 days    CO2  Date Value Ref Range Status  01/20/2021 30 20 - 32 mmol/L Final   Co2  Date Value Ref Range Status  12/09/2011 25 21 - 32 mmol/L Final          Passed - Valid encounter within last 12 months    Recent Outpatient Visits           2 months ago Chiari I malformation [facility])   [facility] Steele Sizer, MD   4 months ago Morbid obesity [facility])   [facility] Steele Sizer, MD   7 months ago COVID-19   [facility] Just, Laurita Quint, FNP   9 months ago Dyslipidemia   [facility] Steele Sizer, MD   1 year ago Left wrist pain   [facility] Towanda Malkin, MD       Future Appointments             In 1 week Steele Sizer, MD [facility], [facility]

## 2021-10-18 NOTE — Telephone Encounter (Signed)
Pt called in to request a refill for her Phentermine-Topiramate (QSYMIA) 7.5-46 MG CP24. Pt has requested via her pharmacy and was told to contact her office instead.   Pharmacy:  [facility] [place] [address], [place] Phone:  [phone]  Fax:  [phone]      Pt's last ov: 07/25/21   Future ov: 10/25/21

## 2021-10-19 ENCOUNTER — Other Ambulatory Visit: Payer: Self-pay

## 2021-10-19 MED ORDER — QSYMIA 7.5-46 MG PO CP24: 1.0000 | ORAL_CAPSULE | Freq: Every day | ORAL | 0 refills | Status: DC

## 2021-10-20 ENCOUNTER — Telehealth: Payer: Self-pay

## 2021-10-20 NOTE — Telephone Encounter (Signed)
Copied from [place] [id number]. Topic: General - Other >> Oct 20, 2021  1:34 PM Yvette Rack wrote: Reason for CRM: Pt stated she was told the Rx for Phentermine-Topiramate (QSYMIA) 7.5-46 MG CP24 needs prior authorization.

## 2021-10-25 ENCOUNTER — Ambulatory Visit: Payer: BC Managed Care – PPO | Admitting: Family Medicine

## 2021-10-26 NOTE — Progress Notes (Signed)
Name: Cathy Gray   MRN: [id number]    DOB: Jun 17, 1966   Date:10/28/2021       Progress Note  Subjective  Chief Complaint  Follow Up  HPI  Chiari 1 Malformation: she went to ENT for tinnitus, had MRI that showed Chiari malformation and was referred to neurosurgeon - Dr. Cari Caraway and was advised against decompression, she also saw  Dr. Pryor Ochoa who prescribed HCTZ 25  for the tinnitus but she stopped it also due to side effects - and did not help . Tinnitus still bad and on the left side   Morbid obesity: BMI was  above 35 with dyslipidemia and DDD spine, She had tried [employer], [employer], [employer] , 21 day fix diet helped her lose 30 lbs a long time ago but when she stopped she gained it right back. We started her on Qsymia and she was tolerating medication well. Start weight was 193 lbs on 06/01/2021 and today it is down to 182 lbs but is out of medication for the past two weeks due to cost -  no longer covered by insurance.  She had more energy whitle taking medication, she still  avoiding carbs. Discussed GLP-1 agonist, she denies personal history of pancreatitis ( had to be admitted for an ulcer in the past) , states sister had thyroid cancer - not sure of the type. Discussed contrave also including possible side effects She will not start Rybelsus if sister had thyroid medullary carcinoma   Asthma: she does not like Wyxella and would like to try brand Advair, she states no cough, wheezing , occasionally has SOB but improves with rescue inhaler, using it at most once a week   Chronic back pain with intermittent flares: she takes  Flexeril and Meloxicam prn,  she has been seeing chiropractor in [place] and it has helped with her pain . She has constant left lower back pain but no radiculitis   Dyslipidemia: taking Crestor and denies side effects, last LDL was down from 187 to 72, continue medication  Patient Active Problem List   Diagnosis Date Noted   Strain of extensor  muscle, fascia and tendon of left index finger at wrist and hand level, initial encounter 08/03/2020   Ganglion cyst 08/03/2020   Trochanteric bursitis of right hip 07/27/2020   History of COVID-19 09/13/2019   Plantar fasciitis of left foot 09/30/2018   Bulging lumbar disc 05/14/2018   Hypercholesteremia 04/18/2018   Hyperglycemia 04/18/2018   Vitamin D deficiency 04/18/2018   B12 deficiency 04/18/2018   History of recent fall 09/10/2017   Obesity (BMI 30.0-34.9) 05/10/2017   Asthma, moderate 07/30/2015   Bilateral cataracts 07/30/2015   Neuralgia neuritis, sciatic nerve 07/30/2015   Recurrent UTI 07/30/2015    Past Surgical History:  Procedure Laterality Date   ABDOMINAL HYSTERECTOMY     BLADDER SUSPENSION  2007   CESAREAN SECTION  2001   COLONOSCOPY WITH PROPOFOL N/A 08/13/2017   Procedure: COLONOSCOPY WITH PROPOFOL;  Surgeon: Jonathon Bellows, MD;  Location: [facility] ENDOSCOPY;  Service: Gastroenterology;  Laterality: N/A;   DE QUERVAIN'S RELEASE Left 11/24/2020   renal abscess  1994    Family History  Problem Relation Age of Onset   Heart disease Father    Asthma Father    Diverticulosis Father    Cancer Sister        Thyroid   Anxiety disorder Daughter    OCD Daughter    Hyperlipidemia Maternal Grandmother    Diverticulosis Maternal Grandmother  Diabetes Maternal Grandfather    Asthma Paternal Grandmother    Hyperlipidemia Paternal Grandmother    Seizures Son    Breast cancer Cousin 56       pat 1st     Social History   Tobacco Use   Smoking status: Never   Smokeless tobacco: Never  Substance Use Topics   Alcohol use: Yes    Alcohol/week: 0.0 standard drinks    Comment: occasionally     Current Outpatient Medications:    ADVAIR DISKUS 250-50 MCG/ACT AEPB, Inhale 1 puff into the lungs in the morning and at bedtime., Disp: 60 each, Rfl: 5   albuterol (VENTOLIN HFA) 108 (90 Base) MCG/ACT inhaler, TAKE 2 PUFFS BY MOUTH EVERY 6 HOURS AS NEEDED FOR WHEEZE OR  SHORTNESS OF BREATH, Disp: 18 g, Rfl: 0   Ascorbic Acid (VITAMIN C) 1000 MG tablet, Take 1,000 mg by mouth daily., Disp: , Rfl:    Cholecalciferol (VITAMIN D) 50 MCG (2000 UT) CAPS, Take 2,000 Units by mouth daily., Disp: , Rfl:    cyclobenzaprine (FLEXERIL) 5 MG tablet, Take 1 tablet (5 mg total) by mouth 3 (three) times daily as needed., Disp: 30 tablet, Rfl: 0   hydrochlorothiazide (HYDRODIURIL) 25 MG tablet, Take 25 mg by mouth daily., Disp: , Rfl:    levocetirizine (XYZAL) 5 MG tablet, Take 1 tablet (5 mg total) by mouth every evening., Disp: 90 tablet, Rfl: 1   meloxicam (MOBIC) 15 MG tablet, Take 1 tablet (15 mg total) by mouth daily., Disp: 30 tablet, Rfl: 0   Pediatric Multivit-Minerals-C (GUMMI BEAR MULTIVITAMIN/MIN) CHEW, Chew 2 tablets by mouth daily., Disp: , Rfl:    Phentermine-Topiramate (QSYMIA) 7.5-46 MG CP24, Take 1 capsule by mouth daily before breakfast. After you finish the 3.75-23 dose, Disp: 30 capsule, Rfl: 0   rosuvastatin (CRESTOR) 10 MG tablet, Take 1 tablet (10 mg total) by mouth daily., Disp: 90 tablet, Rfl: 1   zinc gluconate 50 MG tablet, Take 50 mg by mouth daily., Disp: , Rfl:   Allergies  Allergen Reactions   Nitrofurantoin Anaphylaxis   Amoxicillin     rash   Gabapentin    Molds & Smuts    Montelukast Sodium     tachycardia   Nitrofurantoin Monohyd Macro Diarrhea, Itching and Nausea Only    Other reaction(s): Abdominal pain, Dizziness, Unconsciousness   Penicillin V Potassium Other (See Comments)   Pollen Extract     I personally reviewed active problem list, medication list, allergies, family history, social history, health maintenance with the patient/caregiver today.   ROS  Constitutional: Negative for fever or weight change.  Respiratory: Negative for cough and shortness of breath.   Cardiovascular: Negative for chest pain or palpitations.  Gastrointestinal: Negative for abdominal pain, no bowel changes.  Musculoskeletal: Negative for gait  problem or joint swelling.  Skin: Negative for rash.  Neurological: Negative for dizziness or headache.  No other specific complaints in a complete review of systems (except as listed in HPI above).   Objective  Vitals:   10/28/21 0856  BP: 124/68  Pulse: (!) 103  Resp: 16  Temp: 97.9 F (36.6 C)  SpO2: 96%  Weight: 182 lb (82.6 kg)  Height: 5\' 2"  (1.575 m)    Body mass index is 33.29 kg/m.  Physical Exam  Constitutional: Patient appears well-developed and well-nourished. Obese  No distress.  HEENT: head atraumatic, normocephalic, pupils equal and reactive to light,  neck supple Cardiovascular: Normal rate, regular rhythm and normal heart sounds.  No murmur heard. No BLE edema. Pulmonary/Chest: Effort normal and breath sounds normal. No respiratory distress. Abdominal: Soft.  There is no tenderness. Psychiatric: Patient has a normal mood and affect. behavior is normal. Judgment and thought content normal.    PHQ2/9: Depression screen [facility] 2/9 10/28/2021 07/25/2021 06/01/2021 02/24/2021 01/20/2021  Decreased Interest 0 0 0 0 0  Down, Depressed, Hopeless 0 0 0 0 0  PHQ - 2 Score 0 0 0 0 0  Altered sleeping 0 - - - -  Tired, decreased energy 0 - - - -  Change in appetite 0 - - - -  Feeling bad or failure about yourself  0 - - - -  Trouble concentrating 0 - - - -  Moving slowly or fidgety/restless 0 - - - -  Suicidal thoughts 0 - - - -  PHQ-9 Score 0 - - - -  Difficult doing work/chores - - - - -  Some recent data might be hidden    phq 9 is negative   Fall Risk: Fall Risk  10/28/2021 07/25/2021 06/01/2021 02/24/2021 01/20/2021  Falls in the past year? 0 0 0 0 0  Number falls in past yr: 0 0 0 0 0  Injury with Fall? 0 0 0 0 0  Risk for fall due to : No Fall Risks No Fall Risks - - -  Follow up Falls prevention discussed Falls prevention discussed - - -      Functional Status Survey: Is the patient deaf or have difficulty hearing?: No Does the patient have difficulty  seeing, even when wearing glasses/contacts?: No Does the patient have difficulty concentrating, remembering, or making decisions?: No Does the patient have difficulty walking or climbing stairs?: No Does the patient have difficulty dressing or bathing?: No Does the patient have difficulty doing errands alone such as visiting a doctor's office or shopping?: No    Assessment & Plan  1. Insulin resistance  - Semaglutide (RYBELSUS) 7 MG TABS; Take 7 mg by mouth daily.  Dispense: 90 tablet; Refill: 0  2. Fasting hyperglycemia  - Semaglutide (RYBELSUS) 7 MG TABS; Take 7 mg by mouth daily.  Dispense: 90 tablet; Refill: 0  3. Dyslipidemia   4. Chiari I malformation ([place])   5. Moderate persistent asthma without complication  - ADVAIR DISKUS 250-50 MCG/ACT AEPB; Inhale 1 puff into the lungs in the morning and at bedtime.  Dispense: 60 each; Refill: 5  6. B12 deficiency   7. Need for Tdap vaccination  - Tdap vaccine greater than or equal to 7yo IM  8. Need for shingles vaccine  - Varicella-zoster vaccine IM

## 2021-10-28 ENCOUNTER — Ambulatory Visit: Payer: BC Managed Care – PPO | Admitting: Family Medicine

## 2021-10-28 ENCOUNTER — Encounter: Payer: Self-pay | Admitting: Family Medicine

## 2021-10-28 VITALS — BP 124/68 | HR 103 | Temp 97.9°F | Resp 16 | Ht 62.0 in | Wt 182.0 lb

## 2021-10-28 DIAGNOSIS — E8881 Metabolic syndrome: Secondary | ICD-10-CM | POA: Diagnosis not present

## 2021-10-28 DIAGNOSIS — E785 Hyperlipidemia, unspecified: Secondary | ICD-10-CM | POA: Diagnosis not present

## 2021-10-28 DIAGNOSIS — R7301 Impaired fasting glucose: Secondary | ICD-10-CM

## 2021-10-28 DIAGNOSIS — Z23 Encounter for immunization: Secondary | ICD-10-CM

## 2021-10-28 DIAGNOSIS — G935 Compression of brain: Secondary | ICD-10-CM | POA: Diagnosis not present

## 2021-10-28 DIAGNOSIS — J4540 Moderate persistent asthma, uncomplicated: Secondary | ICD-10-CM

## 2021-10-28 DIAGNOSIS — E538 Deficiency of other specified B group vitamins: Secondary | ICD-10-CM

## 2021-10-28 MED ORDER — RYBELSUS 7 MG PO TABS: 7.0000 mg | ORAL_TABLET | Freq: Every day | ORAL | 0 refills | Status: DC

## 2021-10-28 MED ORDER — ADVAIR DISKUS 250-50 MCG/ACT IN AEPB: 1.0000 | INHALATION_SPRAY | Freq: Two times a day (BID) | RESPIRATORY_TRACT | 5 refills | Status: DC

## 2022-02-04 ENCOUNTER — Telehealth: Payer: Self-pay | Admitting: Family Medicine

## 2022-02-04 DIAGNOSIS — E8881 Metabolic syndrome: Secondary | ICD-10-CM

## 2022-02-04 DIAGNOSIS — R7301 Impaired fasting glucose: Secondary | ICD-10-CM

## 2022-02-06 NOTE — Telephone Encounter (Signed)
Pt is scheduled °

## 2022-02-07 NOTE — Progress Notes (Signed)
Name: Cathy Gray   MRN: [id number]    DOB: [birth year]   Date:02/08/2022 ? ?     Progress Note ? ?Subjective ? ?Chief Complaint ? ?Medication Refill ? ?HPI ? 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 Cari Caraway and was advised against decompression, she also saw  Dr. Pryor Ochoa who prescribed HCTZ 25  for the tinnitus but she stopped it also due to side effects - and did not help . Tinnitus still bad and on the left side, she is doing white noise but is frustrating since nothing that can help with symptoms  ? ?Morbid obesity: BMI was  above 35 with dyslipidemia and DDD spine, She had tried [employer], [employer], [employer] , 21 day fix diet helped her lose 30 lbs a long time ago but when she stopped she gained it right back. We started her on Qsymia and she was tolerating medication well. Start weight was 193 lbs on 06/01/2021 and today it is down to 182 lbs but is out of medication for the past two weeks due to cost -  no longer covered by insurance.  She had more energy whitle taking medication, she still  avoiding carbs. Discussed GLP-1 agonist, she denies personal history of pancreatitis ( had to be admitted for an ulcer in the past) ,  sister had thyroid cancer but not medullary. She started Rybelsus Dec 2022 and states eating much smaller portions, lost only 5 lbs since started on medication . Weight today I 177 lbs ? ?Asthma: she is on Advair and denies side effects,  she states no cough, wheezing , SOB over the past two days due to her allergies flaring, discussed increasing anti histamines to twice daily, can take xyzal at night and add Allegra or claritin in am's. ? ?Chronic back pain with intermittent flares: she takes  Flexeril and Meloxicam prn,  she stopped seeing  chiropractor in [place] but will go back when he gets his office here in [place]. Adjustments helped with the pain.  She has constant left lower  back pain but no radiculitis  ? ?Dyslipidemia: taking Crestor and denies side effects, last LDL was down from 187 to 72, continue medication ? ?Paresthesia: both hands but left worse than right side, noticed some numbness on both hands that is worse at night, sometimes wakes her up, not radiating to arms . No weakness. She shakes her hands to improve symptoms. It is described as pin and needles and intermittent . Discussed wrist brace and NCS versus ortho referral.  ? ?Vitamin D and B12 deficiency: feeling tired and we will recheck levels.  ? ?Patient Active Problem List  ? Diagnosis Date Noted  ? Strain of extensor muscle, fascia and tendon of left index finger at wrist and hand level, initial encounter 08/03/2020  ? Ganglion cyst 08/03/2020  ? Trochanteric bursitis of right hip 07/27/2020  ? History of COVID-19 09/13/2019  ? Plantar fasciitis of left foot 09/30/2018  ? Bulging lumbar disc 05/14/2018  ? Hypercholesteremia 04/18/2018  ? Hyperglycemia 04/18/2018  ? Vitamin D deficiency 04/18/2018  ? B12 deficiency 04/18/2018  ? History of recent fall 09/10/2017  ? Obesity (BMI 30.0-34.9) 05/10/2017  ? Asthma, moderate 07/30/2015  ? Bilateral cataracts 07/30/2015  ? Neuralgia neuritis, sciatic nerve 07/30/2015  ? Recurrent UTI 07/30/2015  ? ? ?Past Surgical History:  ?Procedure Laterality Date  ? ABDOMINAL HYSTERECTOMY    ? BLADDER SUSPENSION  2007  ? CESAREAN SECTION  2001  ? COLONOSCOPY WITH PROPOFOL N/A 08/13/2017  ? Procedure: COLONOSCOPY WITH PROPOFOL;  Surgeon: Jonathon Bellows, MD;  Location: [facility] ENDOSCOPY;  Service: Gastroenterology;  Laterality: N/A;  ? DE QUERVAIN'S RELEASE Left 11/24/2020  ? renal abscess  1994  ? ? ?Family History  ?Problem Relation Age of Onset  ? Heart disease Father   ? Asthma Father   ? Diverticulosis Father   ? Cancer Sister   ?     Thyroid  ? Anxiety disorder Daughter   ? OCD Daughter   ? Hyperlipidemia Maternal Grandmother   ? Diverticulosis Maternal Grandmother   ? Diabetes Maternal  Grandfather   ? Asthma Paternal Grandmother   ? Hyperlipidemia Paternal Grandmother   ? Seizures Son   ? Breast cancer Cousin 32  ?     pat 1st   ? ? ?Social History  ? ?Tobacco Use  ? Smoking status: Never  ? Smokeless tobacco: Never  ?Substance Use Topics  ? Alcohol use: Yes  ?  Alcohol/week: 0.0 standard drinks  ?  Comment: occasionally  ? ? ? ?Current Outpatient Medications:  ?  ADVAIR DISKUS 250-50 MCG/ACT AEPB, Inhale 1 puff into the lungs in the morning and at bedtime., Disp: 60 each, Rfl: 5 ?  albuterol (VENTOLIN HFA) 108 (90 Base) MCG/ACT inhaler, TAKE 2 PUFFS BY MOUTH EVERY 6 HOURS AS NEEDED FOR WHEEZE OR SHORTNESS OF BREATH, Disp: 18 g, Rfl: 0 ?  Ascorbic Acid (VITAMIN C) 1000 MG tablet, Take 1,000 mg by mouth daily., Disp: , Rfl:  ?  Cholecalciferol (VITAMIN D) 50 MCG (2000 UT) CAPS, Take 2,000 Units by mouth daily., Disp: , Rfl:  ?  cyclobenzaprine (FLEXERIL) 5 MG tablet, Take 1 tablet (5 mg total) by mouth 3 (three) times daily as needed., Disp: 30 tablet, Rfl: 0 ?  levocetirizine (XYZAL) 5 MG tablet, Take 1 tablet (5 mg total) by mouth every evening., Disp: 90 tablet, Rfl: 1 ?  meloxicam (MOBIC) 15 MG tablet, Take 1 tablet (15 mg total) by mouth daily., Disp: 30 tablet, Rfl: 0 ?  Pediatric Multivit-Minerals-C (GUMMI BEAR MULTIVITAMIN/MIN) CHEW, Chew 2 tablets by mouth daily., Disp: , Rfl:  ?  rosuvastatin (CRESTOR) 10 MG tablet, Take 1 tablet (10 mg total) by mouth daily., Disp: 90 tablet, Rfl: 1 ?  RYBELSUS 7 MG TABS, TAKE 1 TABLET BY MOUTH EVERY DAY, Disp: 90 tablet, Rfl: 0 ?  zinc gluconate 50 MG tablet, Take 50 mg by mouth daily., Disp: , Rfl:  ? ?Allergies  ?Allergen Reactions  ? Nitrofurantoin Anaphylaxis  ? Amoxicillin   ?  rash  ? Gabapentin   ? Molds & Smuts   ? Montelukast Sodium   ?  tachycardia  ? Nitrofurantoin Monohyd Macro Diarrhea, Itching and Nausea Only  ?  Other reaction(s): Abdominal pain, Dizziness, Unconsciousness  ? Penicillin V Potassium Other (See Comments)  ? Pollen Extract    ? ? ?I personally reviewed active problem list, medication list, allergies, family history, social history, health maintenance with the patient/caregiver today. ? ? ?ROS ? ?Constitutional: Negative for fever , positive for  weight change.  ?Respiratory: Negative for cough and shortness of breath.   ?Cardiovascular: Negative for chest pain or palpitations.  ?Gastrointestinal: Negative for abdominal pain, no bowel changes.  ?Musculoskeletal: Negative for gait problem or joint swelling.  ?Skin: Negative for rash.  ?Neurological: Negative for dizziness or headache.  ?No other specific complaints in a complete review of systems (except as  listed in HPI above).  ? ?Objective ? ?Vitals:  ? 02/08/22 0944  ?BP: 136/84  ?Pulse: 96  ?Resp: 16  ?SpO2: 99%  ?Weight: 177 lb (80.3 kg)  ?Height: '5\' 2"'$  (1.575 m)  ? ? ?Body mass index is 32.37 kg/m?. ? ?Physical Exam ? ?Constitutional: Patient appears well-developed and well-nourished. Obese  No distress.  ?HEENT: head atraumatic, normocephalic, pupils equal and reactive to light, neck supple, throat within normal limits ?Cardiovascular: Normal rate, regular rhythm and normal heart sounds.  No murmur heard. No BLE edema. ?Pulmonary/Chest: Effort normal and breath sounds normal. No respiratory distress. ?Abdominal: Soft.  There is no tenderness. ?Psychiatric: Patient has a normal mood and affect. behavior is normal. Judgment and thought content normal.  ? ?PHQ2/9: ? ?  02/08/2022  ?  9:43 AM 10/28/2021  ?  8:55 AM 07/25/2021  ?  7:41 AM 06/01/2021  ?  9:00 AM 02/24/2021  ?  9:18 AM  ?Depression screen PHQ 2/9  ?Decreased Interest 0 0 0 0 0  ?Down, Depressed, Hopeless 0 0 0 0 0  ?PHQ - 2 Score 0 0 0 0 0  ?Altered sleeping 0 0     ?Tired, decreased energy 0 0     ?Change in appetite 0 0     ?Feeling bad or failure about yourself  0 0     ?Trouble concentrating 0 0     ?Moving slowly or fidgety/restless 0 0     ?Suicidal thoughts 0 0     ?PHQ-9 Score 0 0     ?  ?phq 9 is negative ? ? ?Fall  Risk: ? ?  02/08/2022  ?  9:43 AM 10/28/2021  ?  8:55 AM 07/25/2021  ?  7:41 AM 06/01/2021  ?  9:00 AM 02/24/2021  ?  9:19 AM  ?Fall Risk   ?Falls in the past year? 0 0 0 0 0  ?Number falls in past yr: 0 0 0 0

## 2022-02-08 ENCOUNTER — Ambulatory Visit: Payer: BC Managed Care – PPO | Admitting: Family Medicine

## 2022-02-08 ENCOUNTER — Encounter: Payer: Self-pay | Admitting: Family Medicine

## 2022-02-08 VITALS — BP 136/84 | HR 96 | Resp 16 | Ht 62.0 in | Wt 177.0 lb

## 2022-02-08 DIAGNOSIS — E538 Deficiency of other specified B group vitamins: Secondary | ICD-10-CM

## 2022-02-08 DIAGNOSIS — E669 Obesity, unspecified: Secondary | ICD-10-CM

## 2022-02-08 DIAGNOSIS — G935 Compression of brain: Secondary | ICD-10-CM | POA: Diagnosis not present

## 2022-02-08 DIAGNOSIS — E8881 Metabolic syndrome: Secondary | ICD-10-CM

## 2022-02-08 DIAGNOSIS — J4540 Moderate persistent asthma, uncomplicated: Secondary | ICD-10-CM | POA: Diagnosis not present

## 2022-02-08 DIAGNOSIS — R202 Paresthesia of skin: Secondary | ICD-10-CM

## 2022-02-08 DIAGNOSIS — Z79899 Other long term (current) drug therapy: Secondary | ICD-10-CM

## 2022-02-08 DIAGNOSIS — Z1211 Encounter for screening for malignant neoplasm of colon: Secondary | ICD-10-CM

## 2022-02-08 DIAGNOSIS — Z8349 Family history of other endocrine, nutritional and metabolic diseases: Secondary | ICD-10-CM

## 2022-02-08 DIAGNOSIS — R7301 Impaired fasting glucose: Secondary | ICD-10-CM | POA: Diagnosis not present

## 2022-02-08 DIAGNOSIS — J3089 Other allergic rhinitis: Secondary | ICD-10-CM

## 2022-02-08 DIAGNOSIS — E785 Hyperlipidemia, unspecified: Secondary | ICD-10-CM

## 2022-02-08 DIAGNOSIS — M5442 Lumbago with sciatica, left side: Secondary | ICD-10-CM

## 2022-02-08 DIAGNOSIS — E559 Vitamin D deficiency, unspecified: Secondary | ICD-10-CM

## 2022-02-08 DIAGNOSIS — G8929 Other chronic pain: Secondary | ICD-10-CM

## 2022-02-08 MED ORDER — RYBELSUS 7 MG PO TABS: 7.0000 mg | ORAL_TABLET | Freq: Every day | ORAL | 1 refills | Status: DC

## 2022-02-08 MED ORDER — ROSUVASTATIN CALCIUM 10 MG PO TABS: 10.0000 mg | ORAL_TABLET | Freq: Every day | ORAL | 1 refills | Status: DC

## 2022-02-08 MED ORDER — RYBELSUS 14 MG PO TABS: 14.0000 mg | ORAL_TABLET | Freq: Every day | ORAL | 1 refills | Status: DC

## 2022-02-09 LAB — CBC WITH DIFFERENTIAL/PLATELET
Absolute Monocytes: 442 cells/uL (ref 200–950)
Basophils Absolute: 51 cells/uL (ref 0–200)
Basophils Relative: 0.8 %
Eosinophils Absolute: 109 cells/uL (ref 15–500)
Eosinophils Relative: 1.7 %
HCT: 41.8 % (ref 35.0–45.0)
Hemoglobin: 13.9 g/dL (ref 11.7–15.5)
Lymphs Abs: 1690 cells/uL (ref 850–3900)
MCH: 31.2 pg (ref 27.0–33.0)
MCHC: 33.3 g/dL (ref 32.0–36.0)
MCV: 93.9 fL (ref 80.0–100.0)
MPV: 9.9 fL (ref 7.5–12.5)
Monocytes Relative: 6.9 %
Neutro Abs: 4109 cells/uL (ref 1500–7800)
Neutrophils Relative %: 64.2 %
Platelets: 255 10*3/uL (ref 140–400)
RBC: 4.45 10*6/uL (ref 3.80–5.10)
RDW: 11.9 % (ref 11.0–15.0)
Total Lymphocyte: 26.4 %
WBC: 6.4 10*3/uL (ref 3.8–10.8)

## 2022-02-09 LAB — COMPLETE METABOLIC PANEL WITH GFR
AG Ratio: 1.7 (calc) (ref 1.0–2.5)
ALT: 17 U/L (ref 6–29)
AST: 16 U/L (ref 10–35)
Albumin: 4.5 g/dL (ref 3.6–5.1)
Alkaline phosphatase (APISO): 70 U/L (ref 37–153)
BUN: 16 mg/dL (ref 7–25)
CO2: 32 mmol/L (ref 20–32)
Calcium: 10.2 mg/dL (ref 8.6–10.4)
Chloride: 102 mmol/L (ref 98–110)
Creat: 0.81 mg/dL (ref 0.50–1.03)
Globulin: 2.6 g/dL (calc) (ref 1.9–3.7)
Glucose, Bld: 92 mg/dL (ref 65–99)
Potassium: 4.6 mmol/L (ref 3.5–5.3)
Sodium: 141 mmol/L (ref 135–146)
Total Bilirubin: 0.8 mg/dL (ref 0.2–1.2)
Total Protein: 7.1 g/dL (ref 6.1–8.1)
eGFR: 86 mL/min/{1.73_m2} (ref >=60)

## 2022-02-09 LAB — LIPID PANEL
Cholesterol: 161 mg/dL (ref <200)
HDL: 73 mg/dL (ref >=50)
LDL Cholesterol (Calc): 71 mg/dL (calc)
Non-HDL Cholesterol (Calc): 88 mg/dL (calc) (ref <130)
Total CHOL/HDL Ratio: 2.2 (calc) (ref <5.0)
Triglycerides: 88 mg/dL (ref <150)

## 2022-02-09 LAB — TSH: TSH: 1.14 mIU/L

## 2022-02-09 LAB — HEMOGLOBIN A1C
Hgb A1c MFr Bld: 5.5 % of total Hgb (ref <5.7)
Mean Plasma Glucose: 111 mg/dL
eAG (mmol/L): 6.2 mmol/L

## 2022-02-09 LAB — VITAMIN D 25 HYDROXY (VIT D DEFICIENCY, FRACTURES): Vit D, 25-Hydroxy: 27 ng/mL — ABNORMAL LOW (ref 30–100)

## 2022-02-09 LAB — VITAMIN B12: Vitamin B-12: 526 pg/mL (ref 200–1100)

## 2022-02-22 LAB — HM PAP SMEAR: HM Pap smear: NEGATIVE

## 2022-03-03 ENCOUNTER — Other Ambulatory Visit: Payer: Self-pay | Admitting: Obstetrics and Gynecology

## 2022-03-03 DIAGNOSIS — Z1231 Encounter for screening mammogram for malignant neoplasm of breast: Secondary | ICD-10-CM

## 2022-03-09 ENCOUNTER — Encounter: Payer: Self-pay | Admitting: Podiatry

## 2022-03-09 ENCOUNTER — Ambulatory Visit: Payer: BC Managed Care – PPO | Admitting: Podiatry

## 2022-03-09 DIAGNOSIS — L600 Ingrowing nail: Secondary | ICD-10-CM | POA: Diagnosis not present

## 2022-03-09 NOTE — Progress Notes (Signed)
?Subjective:  ?Patient ID: Cathy Gray, female    DOB: 1966-07-14,  MRN: [id number] ? ?Chief Complaint  ?Patient presents with  ? Nail Problem  ? ? ?56 y.o. female presents with the above complaint.  Patient presents with right fifth digit medial border ingrown.  Patient states painful to touch is progressive gotten worse.  She would like to have removed.  She has not seen anyone else prior to seeing me.  She denies any other acute complaints.  She is not a diabetic. ? ? ?Review of Systems: Negative except as noted in the HPI. Denies N/V/F/Ch. ? ?Past Medical History:  ?Diagnosis Date  ? Allergy   ? Asthma   ? Bilateral cataracts   ? Breast mass, left 12/07/2015  ? Chronic sciatica of left side   ? Colicky right upper quadrant pain   ? Fever blister   ? Hay fever   ? Over weight   ? ? ?Current Outpatient Medications:  ?  ADVAIR DISKUS 250-50 MCG/ACT AEPB, Inhale 1 puff into the lungs in the morning and at bedtime., Disp: 60 each, Rfl: 5 ?  albuterol (VENTOLIN HFA) 108 (90 Base) MCG/ACT inhaler, TAKE 2 PUFFS BY MOUTH EVERY 6 HOURS AS NEEDED FOR WHEEZE OR SHORTNESS OF BREATH, Disp: 18 g, Rfl: 0 ?  Ascorbic Acid (VITAMIN C) 1000 MG tablet, Take 1,000 mg by mouth daily., Disp: , Rfl:  ?  Cholecalciferol (VITAMIN D) 50 MCG (2000 UT) CAPS, Take 2,000 Units by mouth daily., Disp: , Rfl:  ?  cyclobenzaprine (FLEXERIL) 5 MG tablet, Take 1 tablet (5 mg total) by mouth 3 (three) times daily as needed., Disp: 30 tablet, Rfl: 0 ?  levocetirizine (XYZAL) 5 MG tablet, Take 1 tablet (5 mg total) by mouth every evening., Disp: 90 tablet, Rfl: 1 ?  meloxicam (MOBIC) 15 MG tablet, Take 1 tablet (15 mg total) by mouth daily., Disp: 30 tablet, Rfl: 0 ?  Pediatric Multivit-Minerals-C (GUMMI BEAR MULTIVITAMIN/MIN) CHEW, Chew 2 tablets by mouth daily., Disp: , Rfl:  ?  rosuvastatin (CRESTOR) 10 MG tablet, Take 1 tablet (10 mg total) by mouth daily., Disp: 90 tablet, Rfl: 1 ?  Semaglutide (RYBELSUS) 7 MG TABS, Take 7 mg by mouth daily.,  Disp: 90 tablet, Rfl: 1 ?  zinc gluconate 50 MG tablet, Take 50 mg by mouth daily., Disp: , Rfl:  ? ?Social History  ? ?Tobacco Use  ?Smoking Status Never  ?Smokeless Tobacco Never  ? ? ?Allergies  ?Allergen Reactions  ? Nitrofurantoin Anaphylaxis  ? Amoxicillin   ?  rash  ? Gabapentin   ? Molds & Smuts   ? Montelukast Sodium   ?  tachycardia  ? Nitrofurantoin Monohyd Macro Diarrhea, Itching and Nausea Only  ?  Other reaction(s): Abdominal pain, Dizziness, Unconsciousness  ? Penicillin V Potassium Other (See Comments)  ? Pollen Extract   ? ?Objective:  ?There were no vitals filed for this visit. ?There is no height or weight on file to calculate BMI. ?Constitutional Well developed. ?Well nourished.  ?Vascular Dorsalis pedis pulses palpable bilaterally. ?Posterior tibial pulses palpable bilaterally. ?Capillary refill normal to all digits.  ?No cyanosis or clubbing noted. ?Pedal hair growth normal.  ?Neurologic Normal speech. ?Oriented to person, place, and time. ?Epicritic sensation to light touch grossly present bilaterally.  ?Dermatologic Painful ingrowing nail at medial nail borders of the hallux nail right. ?No other open wounds. ?No skin lesions.  ?Orthopedic: Normal joint ROM without pain or crepitus bilaterally. ?No visible deformities. ?No bony tenderness.  ? ?  Radiographs: None ?Assessment:  ? ?1. Ingrown toenail of right foot   ? ?Plan:  ?Patient was evaluated and treated and all questions answered. ? ?Ingrown Nail, right ?-Patient elects to proceed with minor surgery to remove ingrown toenail removal today. Consent reviewed and signed by patient. ?-Ingrown nail excised. See procedure note. ?-Educated on post-procedure care including soaking. Written instructions provided and reviewed. ?-Patient to follow up in 2 weeks for nail check. ? ?Procedure: Excision of Ingrown Toenail ?Location: Right 1st toe medial nail borders. ?Anesthesia: Lidocaine 1% plain; 1.5 mL and Marcaine 0.5% plain; 1.5 mL, digital  block. ?Skin Prep: Betadine. ?Dressing: Silvadene; telfa; dry, sterile, compression dressing. ?Technique: Following skin prep, the toe was exsanguinated and a tourniquet was secured at the base of the toe. The affected nail border was freed, split with a nail splitter, and excised. Chemical matrixectomy was then performed with phenol and irrigated out with alcohol. The tourniquet was then removed and sterile dressing applied. ?Disposition: Patient tolerated procedure well. Patient to return in 2 weeks for follow-up.  ? ?No follow-ups on file. ?

## 2022-03-13 ENCOUNTER — Ambulatory Visit
Admission: RE | Admit: 2022-03-13 | Discharge: 2022-03-13 | Disposition: A | Discharge status: Home / Self Care | Payer: BC Managed Care – PPO | Source: Ambulatory Visit | Attending: Obstetrics and Gynecology | Admitting: Obstetrics and Gynecology

## 2022-03-13 DIAGNOSIS — Z1231 Encounter for screening mammogram for malignant neoplasm of breast: Secondary | ICD-10-CM | POA: Insufficient documentation

## 2022-04-16 ENCOUNTER — Other Ambulatory Visit: Payer: Self-pay | Admitting: Family Medicine

## 2022-04-16 DIAGNOSIS — J3089 Other allergic rhinitis: Secondary | ICD-10-CM

## 2022-05-22 ENCOUNTER — Ambulatory Visit: Payer: Self-pay | Admitting: *Deleted

## 2022-05-22 ENCOUNTER — Ambulatory Visit (INDEPENDENT_AMBULATORY_CARE_PROVIDER_SITE_OTHER): Payer: BC Managed Care – PPO | Admitting: Nurse Practitioner

## 2022-05-22 ENCOUNTER — Other Ambulatory Visit: Payer: Self-pay

## 2022-05-22 ENCOUNTER — Encounter: Payer: Self-pay | Admitting: Nurse Practitioner

## 2022-05-22 VITALS — BP 126/72 | HR 76 | Temp 98.4°F | Resp 16 | Ht 62.0 in | Wt 187.3 lb

## 2022-05-22 DIAGNOSIS — L237 Allergic contact dermatitis due to plants, except food: Secondary | ICD-10-CM

## 2022-05-22 MED ORDER — HYDROCORTISONE 1 % EX CREA: 1.0000 | TOPICAL_CREAM | Freq: Two times a day (BID) | CUTANEOUS | 0 refills | Status: DC

## 2022-05-22 MED ORDER — DEXAMETHASONE SODIUM PHOSPHATE 10 MG/ML IJ SOLN
10.0000 mg | Freq: Once | INTRAMUSCULAR | Status: AC
  Administered 2022-05-22: 10 mg via INTRAMUSCULAR

## 2022-05-22 MED ORDER — PREDNISONE 10 MG (21) PO TBPK: ORAL_TABLET | ORAL | 0 refills | Status: DC

## 2022-05-22 MED ORDER — HYDROXYZINE PAMOATE 25 MG PO CAPS: 25.0000 mg | ORAL_CAPSULE | Freq: Every evening | ORAL | 0 refills | Status: DC | PRN

## 2022-05-22 MED ORDER — CALAMINE EX LOTN: 1.0000 | TOPICAL_LOTION | CUTANEOUS | 0 refills | Status: DC | PRN

## 2022-05-22 NOTE — Telephone Encounter (Signed)
Appt scheduled with Almyra Free

## 2022-05-22 NOTE — Telephone Encounter (Signed)
  Chief Complaint: poison ivy spreading to face , requesting "shot" Symptoms: rash to arms , legs and now face, cheeks and neck. Tiny pimples on rash and right arm with deep scratch with oozing fluid. Itching.  Frequency: 1 week  Pertinent Negatives: Patient denies fever Disposition: '[]'$ ED /'[x]'$ Urgent Care (no appt availability in office) / '[]'$ Appointment(In office/virtual)/ '[]'$  [facility]/ '[]'$ Home Care/ '[]'$ Refused Recommended Disposition /'[]'$ [facility]/ '[]'$  Follow-up with PCP Additional Notes:  No available OV today .  Patient reports she needs to work and requesting a shot to get rid of rash asap. 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 Please advise. Recommended UC due to no visits today.  Reason for Disposition  [1] Face or genitals are involved AND [2] more than a small rash  Protocols used: [facility] [place]

## 2022-05-22 NOTE — Progress Notes (Signed)
BP 126/72   Pulse 76   Temp 98.4 F (36.9 C) (Oral)   Resp 16   Ht _0  (1.575 m)   Wt 187 lb 4.8 oz (85 kg)   SpO2 99%   BMI 34.26 kg/m    Subjective:    Patient ID: Cathy Gray, female    DOB: [birth year], 56 y.o.   MRN: [id number]  HPI: Cathy Gray is a 56 y.o. female  Chief Complaint  Patient presents with   Poison Ivy   Poison ivy dermatitis: Patient reports last weekend she was helping move stuff into a shed.  There was poison ivy everywhere.  Patient has rash on her right forearm bilateral lower legs left side of her face and on her abdomen.  Patient states that it is very itchy.  No sign of infection.  We will give patient injection of dexamethasone in clinic.  He is to start oral prednisone tomorrow.  Discussed using hydrocortisone cream but not using it on her face.  We will also send hydroxyzine in to help with the itching.  Patient could also use calamine lotion.   Relevant past medical, surgical, family and social history reviewed and updated as indicated. Interim medical history since our last visit reviewed. Allergies and medications reviewed and updated.  Review of Systems  Constitutional: Negative for fever or weight change.  Respiratory: Negative for cough and shortness of breath.   Cardiovascular: Negative for chest pain or palpitations.  Gastrointestinal: Negative for abdominal pain, no bowel changes.  Musculoskeletal: Negative for gait problem or joint swelling.  Skin: positive for rash.  Neurological: Negative for dizziness or headache.  No other specific complaints in a complete review of systems (except as listed in HPI above).      Objective:    BP 126/72   Pulse 76   Temp 98.4 F (36.9 C) (Oral)   Resp 16   Ht _1  (1.575 m)   Wt 187 lb 4.8 oz (85 kg)   SpO2 99%   BMI 34.26 kg/m   Wt Readings from Last 3 Encounters:  05/22/22 187 lb 4.8 oz (85 kg)  02/08/22 177 lb (80.3 kg)  10/28/21 182 lb (82.6 kg)    Physical  Exam  Constitutional: Patient appears well-developed and well-nourished. Obese  No distress.  HEENT: head atraumatic, normocephalic, pupils equal and reactive to light,  neck supple, throat within normal limits Cardiovascular: Normal rate, regular rhythm and normal heart sounds.  No murmur heard. No BLE edema. Pulmonary/Chest: Effort normal and breath sounds normal. No respiratory distress. Abdominal: Soft.  There is no tenderness. SKIN: Rash noted to left side of face abdomen right forearm and bilateral lower extremities. Psychiatric: Patient has a normal mood and affect. behavior is normal. Judgment and thought content normal.  Results for orders placed or performed in visit on 02/08/22  Lipid panel  Result Value Ref Range   Cholesterol 161 <200 mg/dL   HDL 73 > OR = 50 mg/dL   Triglycerides 88 <150 mg/dL   LDL Cholesterol (Calc) 71 mg/dL (calc)   Total CHOL/HDL Ratio 2.2 <5.0 (calc)   Non-HDL Cholesterol (Calc) 88 <130 mg/dL (calc)  COMPLETE METABOLIC PANEL WITH GFR  Result Value Ref Range   Glucose, Bld 92 65 - 99 mg/dL   BUN 16 7 - 25 mg/dL   Creat 0.81 0.50 - 1.03 mg/dL   eGFR 86 > OR = 60 mL/min/1.47m   BUN/Creatinine Ratio NOT APPLICABLE 6 - 22 (calc)   Sodium  141 135 - 146 mmol/L   Potassium 4.6 3.5 - 5.3 mmol/L   Chloride 102 98 - 110 mmol/L   CO2 32 20 - 32 mmol/L   Calcium 10.2 8.6 - 10.4 mg/dL   Total Protein 7.1 6.1 - 8.1 g/dL   Albumin 4.5 3.6 - 5.1 g/dL   Globulin 2.6 1.9 - 3.7 g/dL (calc)   AG Ratio 1.7 1.0 - 2.5 (calc)   Total Bilirubin 0.8 0.2 - 1.2 mg/dL   Alkaline phosphatase (APISO) 70 37 - 153 U/L   AST 16 10 - 35 U/L   ALT 17 6 - 29 U/L  CBC with Differential/Platelet  Result Value Ref Range   WBC 6.4 3.8 - 10.8 Thousand/uL   RBC 4.45 3.80 - 5.10 Million/uL   Hemoglobin 13.9 11.7 - 15.5 g/dL   HCT 41.8 35.0 - 45.0 %   MCV 93.9 80.0 - 100.0 fL   MCH 31.2 27.0 - 33.0 pg   MCHC 33.3 32.0 - 36.0 g/dL   RDW 11.9 11.0 - 15.0 %   Platelets 255 140 -  400 Thousand/uL   MPV 9.9 7.5 - 12.5 fL   Neutro Abs 4,109 1,500 - 7,800 cells/uL   Lymphs Abs 1,690 850 - 3,900 cells/uL   Absolute Monocytes 442 200 - 950 cells/uL   Eosinophils Absolute 109 15 - 500 cells/uL   Basophils Absolute 51 0 - 200 cells/uL   Neutrophils Relative % 64.2 %   Total Lymphocyte 26.4 %   Monocytes Relative 6.9 %   Eosinophils Relative 1.7 %   Basophils Relative 0.8 %  TSH  Result Value Ref Range   TSH 1.14 mIU/L  Hemoglobin A1c  Result Value Ref Range   Hgb A1c MFr Bld 5.5 <5.7 % of total Hgb   Mean Plasma Glucose 111 mg/dL   eAG (mmol/L) 6.2 mmol/L  Vitamin B12  Result Value Ref Range   Vitamin B-12 526 200 - 1,100 pg/mL  VITAMIN D 25 Hydroxy (Vit-D Deficiency, Fractures)  Result Value Ref Range   Vit D, 25-Hydroxy 27 (L) 30 - 100 ng/mL      Assessment & Plan:   Problem List Items Addressed This Visit   None Visit Diagnoses     Contact dermatitis due to poison ivy    -  Primary   Patient came in contact with poison ivy.  Patient can use calamine lotion.  Will prescribe steroid pack, hydrocortisone cream, hydroxyzine   Relevant Medications   dexamethasone (DECADRON) injection 10 mg (Completed)   predniSONE (STERAPRED UNI-PAK 21 TAB) 10 MG (21) TBPK tablet   hydrOXYzine (VISTARIL) 25 MG capsule   hydrocortisone cream 1 %   calamine lotion        Follow up plan: No follow-ups on file.

## 2022-05-24 ENCOUNTER — Telehealth: Payer: BC Managed Care – PPO | Admitting: Nurse Practitioner

## 2022-05-25 NOTE — Progress Notes (Deleted)
Name: Cathy Gray   MRN: [id number]    DOB: 07/17/1966   Date:05/25/2022       Progress Note  Subjective  Chief Complaint  Annual Exam  HPI  Patient presents for annual CPE.  Diet: *** Exercise: ***   Flowsheet Row Office Visit from 05/22/2022 in [facility]  AUDIT-C Score 0      Depression: Phq 9 is  {Desc; negative/positive:13464}    05/22/2022   11:40 AM 02/08/2022    9:43 AM 10/28/2021    8:55 AM 07/25/2021    7:41 AM 06/01/2021    9:00 AM  Depression screen PHQ 2/9  Decreased Interest 0 0 0 0 0  Down, Depressed, Hopeless 0 0 0 0 0  PHQ - 2 Score 0 0 0 0 0  Altered sleeping  0 0    Tired, decreased energy  0 0    Change in appetite  0 0    Feeling bad or failure about yourself   0 0    Trouble concentrating  0 0    Moving slowly or fidgety/restless  0 0    Suicidal thoughts  0 0    PHQ-9 Score  0 0     Hypertension: BP Readings from Last 3 Encounters:  05/22/22 126/72  02/08/22 136/84  10/28/21 124/68   Obesity: Wt Readings from Last 3 Encounters:  05/22/22 187 lb 4.8 oz (85 kg)  02/08/22 177 lb (80.3 kg)  10/28/21 182 lb (82.6 kg)   BMI Readings from Last 3 Encounters:  05/22/22 34.26 kg/m  02/08/22 32.37 kg/m  10/28/21 33.29 kg/m     Vaccines:   HPV: N/A Tdap: up to date Shingrix: up to date Pneumonia: N/A Flu: 2022 COVID-19: N/A   Hep C Screening: 06/11/20 STD testing and prevention (HIV/chl/gon/syphilis): N/A Intimate partner violence: negative screen  Sexual History : Menstrual History/LMP/Abnormal Bleeding:  Discussed importance of follow up if any post-menopausal bleeding: {Response; yes/no/na:63}  Incontinence Symptoms: negative for symptoms   Breast cancer:  - Last Mammogram: 03/13/22 - BRCA gene screening: N/A  Osteoporosis Prevention : Discussed high calcium and vitamin D supplementation, weight bearing exercises Bone density: N/A  Cervical cancer screening: 01/09/19  Skin cancer: Discussed monitoring for  atypical lesions  Colorectal cancer: 08/13/17   Lung cancer:  Low Dose CT Chest recommended if Age 61-80 years, 20 pack-year currently smoking OR have quit w/in 15years. Patient does not qualify for screen   ECG: 12/09/11  Advanced Care Planning: A voluntary discussion about advance care planning including the explanation and discussion of advance directives.  Discussed health care proxy and Living will, and the patient was able to identify a health care proxy as ***.  Patient does not have a living will and power of attorney of health care   Lipids: Lab Results  Component Value Date   CHOL 161 02/08/2022   CHOL 147 01/20/2021   CHOL 281 (H) 06/11/2020   Lab Results  Component Value Date   HDL 73 02/08/2022   HDL 59 01/20/2021   HDL 63 06/11/2020   Lab Results  Component Value Date   LDLCALC 71 02/08/2022   LDLCALC 72 01/20/2021   LDLCALC 187 (H) 06/11/2020   Lab Results  Component Value Date   TRIG 88 02/08/2022   TRIG 84 01/20/2021   TRIG 166 (H) 06/11/2020   Lab Results  Component Value Date   CHOLHDL 2.2 02/08/2022   CHOLHDL 2.5 01/20/2021   CHOLHDL 4.5 06/11/2020  No results found for: "LDLDIRECT"  Glucose: Glucose  Date Value Ref Range Status  12/09/2011 145 (H) 65 - 99 mg/dL Final   Glucose, Bld  Date Value Ref Range Status  02/08/2022 92 65 - 99 mg/dL Final    Comment:    .            Fasting reference interval .   01/20/2021 105 (H) 65 - 99 mg/dL Final    Comment:    .            Fasting reference interval . For someone without known diabetes, a glucose value between 100 and 125 mg/dL is consistent with prediabetes and should be confirmed with a follow-up test. .   06/11/2020 98 65 - 99 mg/dL Final    Comment:    .            Fasting reference interval .     Patient Active Problem List   Diagnosis Date Noted   Strain of extensor muscle, fascia and tendon of left index finger at wrist and hand level, initial encounter 08/03/2020    Ganglion cyst 08/03/2020   Trochanteric bursitis of right hip 07/27/2020   History of COVID-19 09/13/2019   Plantar fasciitis of left foot 09/30/2018   Bulging lumbar disc 05/14/2018   Hypercholesteremia 04/18/2018   Hyperglycemia 04/18/2018   Vitamin D deficiency 04/18/2018   B12 deficiency 04/18/2018   History of recent fall 09/10/2017   Obesity (BMI 30.0-34.9) 05/10/2017   Asthma, moderate 07/30/2015   Bilateral cataracts 07/30/2015   Neuralgia neuritis, sciatic nerve 07/30/2015   Recurrent UTI 07/30/2015    Past Surgical History:  Procedure Laterality Date   ABDOMINAL HYSTERECTOMY     BLADDER SUSPENSION  2007   CESAREAN SECTION  2001   COLONOSCOPY WITH PROPOFOL N/A 08/13/2017   Procedure: COLONOSCOPY WITH PROPOFOL;  Surgeon: Jonathon Bellows, MD;  [facility] ENDOSCOPY;  Service: Gastroenterology;  Laterality: N/A;   DE QUERVAIN'S RELEASE Left 11/24/2020   renal abscess  1994    Family History  Problem Relation Age of Onset   Heart disease Father    Asthma Father    Diverticulosis Father    Cancer Sister        Thyroid   Anxiety disorder Daughter    OCD Daughter    Hyperlipidemia Maternal Grandmother    Diverticulosis Maternal Grandmother    Diabetes Maternal Grandfather    Asthma Paternal Grandmother    Hyperlipidemia Paternal Grandmother    Seizures Son    Breast cancer Cousin 78       pat 1st     Social History   Socioeconomic History   Marital status: Married    Spouse name: Scot   Number of children: 3   Years of education: Not on file   Highest education level: Some college, no degree  Occupational History   Not on file  Tobacco Use   Smoking status: Never   Smokeless tobacco: Never  Vaping Use   Vaping Use: Never used  Substance and Sexual Activity   Alcohol use: Yes    Alcohol/week: 0.0 standard drinks of alcohol    Comment: occasionally   Drug use: No   Sexual activity: Yes    Partners: Male    Birth control/protection: Other-see comments     Comment: Hysterectomy  Other Topics Concern   Not on file  Social History Narrative   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  Social Determinants of Health   Financial Resource Strain: Low Risk  (06/01/2021)   Overall Financial Resource Strain (CARDIA)    Difficulty of Paying Living Expenses: Not hard at all  Food Insecurity: No Food Insecurity (06/01/2021)   Hunger Vital Sign    Worried About Running Out of Food in the Last Year: Never true    Ran Out of Food in the Last Year: Never true  Transportation Needs: No Transportation Needs (06/01/2021)   PRAPARE - Hydrologist (Medical): No    Lack of Transportation (Non-Medical): No  Physical Activity: Insufficiently Active (06/01/2021)   Exercise Vital Sign    Days of Exercise per Week: 3 days    Minutes of Exercise per Session: 30 min  Stress: No Stress Concern Present (06/01/2021)   [place]    Feeling of Stress : Not at all  Social Connections: Moderately Integrated (06/01/2021)   Social Connection and Isolation Panel [NHANES]    Frequency of Communication with Friends and Family: More than three times a week    Frequency of Social Gatherings with Friends and Family: Twice a week    Attends Religious Services: Never    Marine scientist or Organizations: Yes    Attends Archivist Meetings: 1 to 4 times per year    Marital Status: Married  Human resources officer Violence: Not At Risk (06/01/2021)   Humiliation, Afraid, Rape, and Kick questionnaire    Fear of Current or Ex-Partner: No    Emotionally Abused: No    Physically Abused: No    Sexually Abused: No     Current Outpatient Medications:    ADVAIR DISKUS 250-50 MCG/ACT AEPB, Inhale 1 puff into the lungs in the morning and at bedtime., Disp: 60 each, Rfl: 5   albuterol (VENTOLIN HFA) 108 (90 Base) MCG/ACT inhaler, TAKE 2 PUFFS BY MOUTH EVERY 6  HOURS AS NEEDED FOR WHEEZE OR SHORTNESS OF BREATH, Disp: 18 g, Rfl: 0   Ascorbic Acid (VITAMIN C) 1000 MG tablet, Take 1,000 mg by mouth daily., Disp: , Rfl:    calamine lotion, Apply 1 Application topically as needed for itching., Disp: 177 mL, Rfl: 0   Cholecalciferol (VITAMIN D) 50 MCG (2000 UT) CAPS, Take 2,000 Units by mouth daily., Disp: , Rfl:    cyclobenzaprine (FLEXERIL) 5 MG tablet, Take 1 tablet (5 mg total) by mouth 3 (three) times daily as needed., Disp: 30 tablet, Rfl: 0   hydrocortisone cream 1 %, Apply 1 Application topically 2 (two) times daily. Do not apply to face, Disp: 26 g, Rfl: 0   hydrOXYzine (VISTARIL) 25 MG capsule, Take 1 capsule (25 mg total) by mouth at bedtime as needed for itching., Disp: 30 capsule, Rfl: 0   levocetirizine (XYZAL) 5 MG tablet, TAKE 1 TABLET BY MOUTH EVERY DAY IN THE EVENING, Disp: 90 tablet, Rfl: 1   meloxicam (MOBIC) 15 MG tablet, Take 1 tablet (15 mg total) by mouth daily., Disp: 30 tablet, Rfl: 0   Pediatric Multivit-Minerals-C (GUMMI BEAR MULTIVITAMIN/MIN) CHEW, Chew 2 tablets by mouth daily., Disp: , Rfl:    predniSONE (STERAPRED UNI-PAK 21 TAB) 10 MG (21) TBPK tablet, Take as directed on package.  (60 mg po on day 1, 50 mg po on day 2...), Disp: 21 tablet, Rfl: 0   rosuvastatin (CRESTOR) 10 MG tablet, Take 1 tablet (10 mg total) by mouth daily., Disp: 90 tablet, Rfl: 1  Allergies  Allergen  Reactions   Nitrofurantoin Anaphylaxis   Amoxicillin     rash   Gabapentin    Molds & Smuts    Montelukast Sodium     tachycardia   Nitrofurantoin Monohyd Macro Diarrhea, Itching and Nausea Only    Other reaction(s): Abdominal pain, Dizziness, Unconsciousness   Penicillin V Potassium Other (See Comments)   Pollen Extract      ROS  ***  Objective  There were no vitals filed for this visit.  There is no height or weight on file to calculate BMI.  Physical Exam ***  No results found for this or any previous visit (from the past 2160  hour(s)).   Fall Risk:    05/22/2022   11:39 AM 02/08/2022    9:43 AM 10/28/2021    8:55 AM 07/25/2021    7:41 AM 06/01/2021    9:00 AM  Fall Risk   Falls in the past year? 0 0 0 0 0  Number falls in past yr: 0 0 0 0 0  Injury with Fall? 0 0 0 0 0  Risk for fall due to :  No Fall Risks No Fall Risks No Fall Risks   Follow up Falls evaluation completed Falls prevention discussed Falls prevention discussed Falls prevention discussed      Functional Status Survey:     Assessment & Plan  1. Well adult exam ***   -USPSTF grade A and B recommendations reviewed with patient; age-appropriate recommendations, preventive care, screening tests, etc discussed and encouraged; healthy living encouraged; see AVS for patient education given to patient -Discussed importance of 150 minutes of physical activity weekly, eat two servings of fish weekly, eat one serving of tree nuts ( cashews, pistachios, pecans, almonds.Marland Kitchen) every other day, eat 6 servings of fruit/vegetables daily and drink plenty of water and avoid sweet beverages.   -Reviewed Health Maintenance: Yes.

## 2022-05-26 ENCOUNTER — Encounter: Payer: BC Managed Care – PPO | Admitting: Family Medicine

## 2022-05-26 DIAGNOSIS — Z0000 Encounter for general adult medical examination without abnormal findings: Secondary | ICD-10-CM

## 2022-08-09 NOTE — Progress Notes (Unsigned)
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 Cari Caraway and was advised against decompression, she also saw  Dr. Pryor Ochoa who prescribed HCTZ 25  for the tinnitus but she stopped it also due to side effects - and did not help . Tinnitus still bad and on the left side, she is doing white noise but is frustrating since nothing that can help with symptoms   Morbid obesity: BMI was  above 35 with dyslipidemia and DDD spine, She had tried [employer], [employer], [employer] , 21 day fix diet helped her lose 30 lbs a long time ago but when she stopped she gained it right back. We started her on Qsymia and she was tolerating medication well. Start weight was 193 lbs on 06/01/2021 and today it is down to 182 lbs but is out of medication for the past two weeks due to cost -  no longer covered by insurance.  She had more energy whitle taking medication, she still  avoiding carbs. Discussed GLP-1 agonist, she denies personal history of pancreatitis ( had to be admitted for an ulcer in the past) ,  sister had thyroid cancer but not medullary. She started Rybelsus Dec 2022 and  lost weight down to 177 lbs, however she was not able to sleep while taking it,  weight went up to 187 lbs and has been stable since July at 186 lbs. She joined [employer]	[place] but the cost is too high now , she is going to switch gyms. She states her son Edison Nasuti is getting married next year   Asthma: she is on Advair and denies side effects,  she states no cough, wheezing , SOB , doing well on medication. She needs a rescue inhaler   Chronic back pain with intermittent flares: she takes  Flexeril and Meloxicam prn,  she stopped seeing  chiropractor in [place] but will go back when he gets his office here in [place].  Adjustments helped with the pain.  She has constant left lower back pain but no radiculitis Unchanged   Dyslipidemia: taking Crestor and denies side effects, last LDL was down from 187 to 72, continue medication. No side effects  Paresthesia: both hands but left worse than right side, noticed some numbness on both hands that is worse at night, sometimes wakes her up, not radiating to arms . No weakness. She shakes her hands to improve symptoms. It is described as pin and needles and intermittent . She also has difficulty flexing her thumbs, left worse than right side. She was seen by Ortho and is supposed to have surgery but husband will be out of town for the next two months   Vitamin D and B12 deficiency: feeling tired and we will recheck levels.   Knee pain; she has noticed aching on both knees intermittently throughout the day. She is avoiding taking meloxicam daily but it does help with the pain  Menopausal symptoms: she had a hysterectomy years ago but noticing worsening of hot flashes over the past couple of months, also worried about weight gain, mood is good   Patient Active Problem List   Diagnosis Date Noted   Strain of extensor muscle, fascia and tendon of left index finger at wrist  and hand level, initial encounter 08/03/2020   Ganglion cyst 08/03/2020   Trochanteric bursitis of right hip 07/27/2020   History of COVID-19 09/13/2019   Plantar fasciitis of left foot 09/30/2018   Bulging lumbar disc 05/14/2018   Hypercholesteremia 04/18/2018   Hyperglycemia 04/18/2018   Vitamin D deficiency 04/18/2018   B12 deficiency 04/18/2018   History of recent fall 09/10/2017   Obesity (BMI 30.0-34.9) 05/10/2017   Asthma, moderate 07/30/2015   Bilateral cataracts 07/30/2015   Neuralgia neuritis, sciatic nerve 07/30/2015   Recurrent UTI 07/30/2015    Past Surgical History:  Procedure Laterality Date   ABDOMINAL HYSTERECTOMY     BLADDER SUSPENSION  2007   CESAREAN SECTION  2001    COLONOSCOPY WITH PROPOFOL N/A 08/13/2017   Procedure: COLONOSCOPY WITH PROPOFOL;  Surgeon: Jonathon Bellows, MD;  Location: [facility] ENDOSCOPY;  Service: Gastroenterology;  Laterality: N/A;   DE QUERVAIN'S RELEASE Left 11/24/2020   renal abscess  1994    Family History  Problem Relation Age of Onset   Heart disease Father    Asthma Father    Diverticulosis Father    Cancer Sister        Thyroid   Anxiety disorder Daughter    OCD Daughter    Hyperlipidemia Maternal Grandmother    Diverticulosis Maternal Grandmother    Diabetes Maternal Grandfather    Asthma Paternal Grandmother    Hyperlipidemia Paternal Grandmother    Seizures Son    Breast cancer Cousin 71       pat 1st     Social History   Tobacco Use   Smoking status: Never   Smokeless tobacco: Never  Substance Use Topics   Alcohol use: Yes    Alcohol/week: 0.0 standard drinks of alcohol    Comment: occasionally     Current Outpatient Medications:    ADVAIR DISKUS 250-50 MCG/ACT AEPB, Inhale 1 puff into the lungs in the morning and at bedtime., Disp: 60 each, Rfl: 5   albuterol (VENTOLIN HFA) 108 (90 Base) MCG/ACT inhaler, TAKE 2 PUFFS BY MOUTH EVERY 6 HOURS AS NEEDED FOR WHEEZE OR SHORTNESS OF BREATH, Disp: 18 g, Rfl: 0   Ascorbic Acid (VITAMIN C) 1000 MG tablet, Take 1,000 mg by mouth daily., Disp: , Rfl:    Cholecalciferol (VITAMIN D) 50 MCG (2000 UT) CAPS, Take 2,000 Units by mouth daily., Disp: , Rfl:    cyclobenzaprine (FLEXERIL) 5 MG tablet, Take 1 tablet (5 mg total) by mouth 3 (three) times daily as needed., Disp: 30 tablet, Rfl: 0   levocetirizine (XYZAL) 5 MG tablet, TAKE 1 TABLET BY MOUTH EVERY DAY IN THE EVENING, Disp: 90 tablet, Rfl: 1   meloxicam (MOBIC) 15 MG tablet, Take 1 tablet (15 mg total) by mouth daily., Disp: 30 tablet, Rfl: 0   Pediatric Multivit-Minerals-C (GUMMI BEAR MULTIVITAMIN/MIN) CHEW, Chew 2 tablets by mouth daily., Disp: , Rfl:    rosuvastatin (CRESTOR) 10 MG tablet, Take 1 tablet (10 mg total)  by mouth daily., Disp: 90 tablet, Rfl: 1  Allergies  Allergen Reactions   Nitrofurantoin Anaphylaxis   Amoxicillin     rash   Gabapentin    Molds & Smuts    Montelukast Sodium     tachycardia   Nitrofurantoin Monohyd Macro Diarrhea, Itching and Nausea Only    Other reaction(s): Abdominal pain, Dizziness, Unconsciousness   Penicillin V Potassium Other (See Comments)   Pollen Extract     I personally reviewed active problem list, medication list, allergies, family history, social history,  health maintenance with the patient/caregiver today.   ROS  Constitutional: Negative for fever or weight change.  Respiratory: Negative for cough and shortness of breath.   Cardiovascular: Negative for chest pain or palpitations.  Gastrointestinal: Negative for abdominal pain, no bowel changes.  Musculoskeletal: Negative for gait problem or joint swelling.  Skin: Negative for rash.  Neurological: Negative for dizziness or headache.  No other specific complaints in a complete review of systems (except as listed in HPI above).   Objective  Vitals:   08/10/22 0801  BP: 122/84  Pulse: 93  Resp: 16  SpO2: 98%  Weight: 186 lb (84.4 kg)  Height: '5\' 3"'$  (1.6 m)    Body mass index is 32.95 kg/m.  Physical Exam  Constitutional: Patient appears well-developed and well-nourished. Obese  No distress.  HEENT: head atraumatic, normocephalic, pupils equal and reactive to light, neck supple Cardiovascular: Normal rate, regular rhythm and normal heart sounds.  No murmur heard. No BLE edema. Pulmonary/Chest: Effort normal and breath sounds normal. No respiratory distress. Abdominal: Soft.  There is no tenderness. Muscular skeletal: normal rom of both knees Psychiatric: Patient has a normal mood and affect. behavior is normal. Judgment and thought content normal.    PHQ2/9:    08/10/2022    8:01 AM 05/22/2022   11:40 AM 02/08/2022    9:43 AM 10/28/2021    8:55 AM 07/25/2021    7:41 AM   Depression screen PHQ 2/9  Decreased Interest 0 0 0 0 0  Down, Depressed, Hopeless 0 0 0 0 0  PHQ - 2 Score 0 0 0 0 0  Altered sleeping 0  0 0   Tired, decreased energy 0  0 0   Change in appetite 0  0 0   Feeling bad or failure about yourself  0  0 0   Trouble concentrating 0  0 0   Moving slowly or fidgety/restless 0  0 0   Suicidal thoughts 0  0 0   PHQ-9 Score 0  0 0     phq 9 is negative   Fall Risk:    08/10/2022    8:01 AM 05/22/2022   11:39 AM 02/08/2022    9:43 AM 10/28/2021    8:55 AM 07/25/2021    7:41 AM  Fall Risk   Falls in the past year? 0 0 0 0 0  Number falls in past yr: 0 0 0 0 0  Injury with Fall? 0 0 0 0 0  Risk for fall due to : No Fall Risks  No Fall Risks No Fall Risks No Fall Risks  Follow up Falls prevention discussed Falls evaluation completed Falls prevention discussed Falls prevention discussed Falls prevention discussed      Functional Status Survey: Is the patient deaf or have difficulty hearing?: No Does the patient have difficulty seeing, even when wearing glasses/contacts?: No Does the patient have difficulty concentrating, remembering, or making decisions?: No Does the patient have difficulty walking or climbing stairs?: No Does the patient have difficulty dressing or bathing?: No Does the patient have difficulty doing errands alone such as visiting a doctor's office or shopping?: No    Assessment & Plan  1. Chiari I malformation ([place])  No problems  2. Dyslipidemia  Continue rosuvastatin   3. Fasting hyperglycemia   4. Moderate persistent asthma without complication  - levalbuterol (XOPENEX HFA) 45 MCG/ACT inhaler; Inhale 2 puffs into the lungs 4 (four) times daily.  Dispense: 45 g; Refill: 0 - ADVAIR  DISKUS 250-50 MCG/ACT AEPB; Inhale 1 puff into the lungs in the morning and at bedtime.  Dispense: 60 each; Refill: 5  5. Menopause syndrome  We will try duloxetine   6. Need for immunization against influenza  - Flu  Vaccine QUAD 6+ mos PF IM (Fluarix Quad PF)  7. B12 deficiency  Discussed b12 otc sublingual   She will get an injection today   8. Chronic left-sided low back pain with left-sided sciatica  - DULoxetine (CYMBALTA) 30 MG capsule; Take 1 capsule (30 mg total) by mouth daily.  Dispense: 90 capsule; Refill: 0  9. Insulin resistance   10. Perennial allergic rhinitis   11. Vitamin D deficiency  Resume supplementation  12. Obesity (BMI 30.0-34.9)  Discussed with the patient the risk posed by an increased BMI. Discussed importance of portion control, calorie counting and at least 150 minutes of physical activity weekly. Avoid sweet beverages and drink more water. Eat at least 6 servings of fruit and vegetables daily    13. Bilateral anterior knee pain  - DULoxetine (CYMBALTA) 30 MG capsule; Take 1 capsule (30 mg total) by mouth daily.  Dispense: 90 capsule; Refill: 0

## 2022-08-10 ENCOUNTER — Ambulatory Visit: Payer: BC Managed Care – PPO | Admitting: Family Medicine

## 2022-08-10 ENCOUNTER — Encounter: Payer: Self-pay | Admitting: Family Medicine

## 2022-08-10 VITALS — BP 122/84 | HR 93 | Resp 16 | Ht 63.0 in | Wt 186.0 lb

## 2022-08-10 DIAGNOSIS — E88819 Insulin resistance, unspecified: Secondary | ICD-10-CM | POA: Insufficient documentation

## 2022-08-10 DIAGNOSIS — R7301 Impaired fasting glucose: Secondary | ICD-10-CM | POA: Diagnosis not present

## 2022-08-10 DIAGNOSIS — J4540 Moderate persistent asthma, uncomplicated: Secondary | ICD-10-CM | POA: Diagnosis not present

## 2022-08-10 DIAGNOSIS — G935 Compression of brain: Secondary | ICD-10-CM

## 2022-08-10 DIAGNOSIS — M25561 Pain in right knee: Secondary | ICD-10-CM

## 2022-08-10 DIAGNOSIS — E538 Deficiency of other specified B group vitamins: Secondary | ICD-10-CM

## 2022-08-10 DIAGNOSIS — E559 Vitamin D deficiency, unspecified: Secondary | ICD-10-CM

## 2022-08-10 DIAGNOSIS — G8929 Other chronic pain: Secondary | ICD-10-CM

## 2022-08-10 DIAGNOSIS — M25562 Pain in left knee: Secondary | ICD-10-CM | POA: Insufficient documentation

## 2022-08-10 DIAGNOSIS — J3089 Other allergic rhinitis: Secondary | ICD-10-CM

## 2022-08-10 DIAGNOSIS — E66811 Obesity, class 1: Secondary | ICD-10-CM

## 2022-08-10 DIAGNOSIS — M5442 Lumbago with sciatica, left side: Secondary | ICD-10-CM

## 2022-08-10 DIAGNOSIS — E785 Hyperlipidemia, unspecified: Secondary | ICD-10-CM

## 2022-08-10 DIAGNOSIS — N951 Menopausal and female climacteric states: Secondary | ICD-10-CM

## 2022-08-10 DIAGNOSIS — E669 Obesity, unspecified: Secondary | ICD-10-CM

## 2022-08-10 DIAGNOSIS — Z23 Encounter for immunization: Secondary | ICD-10-CM | POA: Diagnosis not present

## 2022-08-10 MED ORDER — DULOXETINE HCL 30 MG PO CPEP: 30.0000 mg | ORAL_CAPSULE | Freq: Every day | ORAL | 0 refills | Status: DC

## 2022-08-10 MED ORDER — ROSUVASTATIN CALCIUM 10 MG PO TABS: 10.0000 mg | ORAL_TABLET | Freq: Every day | ORAL | 1 refills | Status: DC

## 2022-08-10 MED ORDER — CYANOCOBALAMIN 1000 MCG/ML IJ SOLN
1000.0000 ug | Freq: Once | INTRAMUSCULAR | Status: AC
  Administered 2022-08-10: 1000 ug via INTRAMUSCULAR

## 2022-08-10 MED ORDER — ADVAIR DISKUS 250-50 MCG/ACT IN AEPB: 1.0000 | INHALATION_SPRAY | Freq: Two times a day (BID) | RESPIRATORY_TRACT | 5 refills | Status: DC

## 2022-08-10 MED ORDER — LEVALBUTEROL TARTRATE 45 MCG/ACT IN AERO: 2.0000 | INHALATION_SPRAY | Freq: Four times a day (QID) | RESPIRATORY_TRACT | 0 refills | Status: DC

## 2022-09-15 ENCOUNTER — Other Ambulatory Visit: Payer: Self-pay | Admitting: Family Medicine

## 2022-09-15 DIAGNOSIS — N951 Menopausal and female climacteric states: Secondary | ICD-10-CM

## 2022-09-15 DIAGNOSIS — M25562 Pain in left knee: Secondary | ICD-10-CM

## 2022-09-15 DIAGNOSIS — M5442 Lumbago with sciatica, left side: Secondary | ICD-10-CM

## 2022-12-21 ENCOUNTER — Encounter
Admission: RE | Disposition: A | Discharge status: Home / Self Care | Payer: Self-pay | Source: Ambulatory Visit | Attending: Gastroenterology

## 2022-12-21 ENCOUNTER — Ambulatory Visit
Admission: RE | Admit: 2022-12-21 | Discharge: 2022-12-21 | Disposition: A | Discharge status: Home / Self Care | Payer: BC Managed Care – PPO | Source: Ambulatory Visit | Attending: Gastroenterology | Admitting: Gastroenterology

## 2022-12-21 ENCOUNTER — Ambulatory Visit: Payer: BC Managed Care – PPO | Admitting: Anesthesiology

## 2022-12-21 ENCOUNTER — Encounter: Payer: Self-pay | Admitting: Gastroenterology

## 2022-12-21 DIAGNOSIS — Z83719 Family history of colon polyps, unspecified: Secondary | ICD-10-CM | POA: Insufficient documentation

## 2022-12-21 DIAGNOSIS — D123 Benign neoplasm of transverse colon: Secondary | ICD-10-CM | POA: Diagnosis not present

## 2022-12-21 DIAGNOSIS — K640 First degree hemorrhoids: Secondary | ICD-10-CM | POA: Insufficient documentation

## 2022-12-21 DIAGNOSIS — D124 Benign neoplasm of descending colon: Secondary | ICD-10-CM | POA: Insufficient documentation

## 2022-12-21 DIAGNOSIS — Z1211 Encounter for screening for malignant neoplasm of colon: Secondary | ICD-10-CM | POA: Insufficient documentation

## 2022-12-21 DIAGNOSIS — Z86010 Personal history of colonic polyps: Secondary | ICD-10-CM | POA: Insufficient documentation

## 2022-12-21 HISTORY — PX: COLONOSCOPY WITH PROPOFOL: SHX5780

## 2022-12-21 SURGERY — COLONOSCOPY WITH PROPOFOL
Anesthesia: General

## 2022-12-21 MED ORDER — MIDAZOLAM HCL 2 MG/2ML IJ SOLN
INTRAMUSCULAR | Status: AC
  Filled 2022-12-21: qty 2

## 2022-12-21 MED ORDER — DEXAMETHASONE SODIUM PHOSPHATE 10 MG/ML IJ SOLN
INTRAMUSCULAR | Status: DC | PRN
  Administered 2022-12-21: 10 mg via INTRAVENOUS

## 2022-12-21 MED ORDER — PROPOFOL 1000 MG/100ML IV EMUL
INTRAVENOUS | Status: AC
  Filled 2022-12-21: qty 100

## 2022-12-21 MED ORDER — ONDANSETRON HCL 4 MG/2ML IJ SOLN
INTRAMUSCULAR | Status: DC | PRN
  Administered 2022-12-21: 4 mg via INTRAVENOUS

## 2022-12-21 MED ORDER — SODIUM CHLORIDE 0.9 % IV SOLN: INTRAVENOUS | Status: DC

## 2022-12-21 MED ORDER — LIDOCAINE HCL (CARDIAC) PF 100 MG/5ML IV SOSY
PREFILLED_SYRINGE | INTRAVENOUS | Status: DC | PRN
  Administered 2022-12-21: 100 mg via INTRAVENOUS

## 2022-12-21 MED ORDER — PROPOFOL 10 MG/ML IV BOLUS
INTRAVENOUS | Status: DC | PRN
  Administered 2022-12-21: 80 mg via INTRAVENOUS

## 2022-12-21 MED ORDER — FENTANYL CITRATE (PF) 100 MCG/2ML IJ SOLN
INTRAMUSCULAR | Status: AC
  Filled 2022-12-21: qty 2

## 2022-12-21 MED ORDER — PROPOFOL 500 MG/50ML IV EMUL
INTRAVENOUS | Status: DC | PRN
  Administered 2022-12-21: 150 ug/kg/min via INTRAVENOUS

## 2022-12-21 NOTE — Interval H&P Note (Signed)
History and Physical Interval Note: Preprocedure H&P from 12/21/22  was reviewed and there was no interval change after seeing and examining the patient.  Written consent was obtained from the patient after discussion of risks, benefits, and alternatives. Patient has consented to proceed with Colonoscopy with possible intervention   12/21/2022 11:10 AM  Cathy Gray  has presented today for surgery, with the diagnosis of personal history adenomatous polyp.  The various methods of treatment have been discussed with the patient and family. After consideration of risks, benefits and other options for treatment, the patient has consented to  Procedure(s): COLONOSCOPY WITH PROPOFOL (N/A) as a surgical intervention.  The patient's history has been reviewed, patient examined, no change in status, stable for surgery.  I have reviewed the patient's chart and labs.  Questions were answered to the patient's satisfaction.     Annamaria Helling

## 2022-12-21 NOTE — Transfer of Care (Signed)
Immediate Anesthesia Transfer of Care Note  Patient: Cathy Gray  Procedure(s) Performed: Procedure(s): COLONOSCOPY WITH PROPOFOL (N/A)  Patient Location: PACU and Endoscopy Unit  Anesthesia Type:General  Level of Consciousness: sedated  Airway & Oxygen Therapy: Patient Spontanous Breathing and Patient connected to nasal cannula oxygen  Post-op Assessment: Report given to RN and Post -op Vital signs reviewed and stable  Post vital signs: Reviewed and stable  Last Vitals:  Vitals:   12/21/22 0936 12/21/22 1145  BP: (!) 155/97 123/84  Pulse: 97 80  Resp: 16 14  Temp: (!) 36 C (!) 36 C  SpO2: 123XX123 [id number]    Complications: No apparent anesthesia complications

## 2022-12-21 NOTE — Op Note (Signed)
[facility] Gastroenterology Patient Name: Marra Fraga Procedure Date: 12/21/2022 11:05 AM MRN: [id number] Account #: [id number] Date of Birth: Sep 08, 1966 Admit Type: Outpatient Age: 57 Room: [facility] ENDO ROOM 1 Gender: Female Note Status: Finalized Instrument Name: Colonoscope [id number] Procedure:             Colonoscopy Indications:           High risk colon cancer surveillance: Personal history                         of colonic polyps Providers:             Rueben Bash, DO Referring MD:          Bethena Roys. Sowles, MD (Referring MD) Medicines:             Monitored Anesthesia Care Complications:         No immediate complications. Estimated blood loss:                         Minimal. Procedure:             Pre-Anesthesia Assessment:                        - Prior to the procedure, a History and Physical was                         performed, and patient medications and allergies were                         reviewed. The patient is competent. The risks and                         benefits of the procedure and the sedation options and                         risks were discussed with the patient. All questions                         were answered and informed consent was obtained.                         Patient identification and proposed procedure were                         verified by the physician, the nurse, the anesthetist                         and the technician in the endoscopy suite. Mental                         Status Examination: alert and oriented. Airway                         Examination: normal oropharyngeal airway and neck                         mobility. Respiratory Examination: clear to  auscultation. CV Examination: RRR, no murmurs, no S3                         or S4. Prophylactic Antibiotics: The patient does not                         require prophylactic antibiotics. Prior                          Anticoagulants: The patient has taken no anticoagulant                         or antiplatelet agents. ASA Grade Assessment: II - A                         patient with mild systemic disease. After reviewing                         the risks and benefits, the patient was deemed in                         satisfactory condition to undergo the procedure. The                         anesthesia plan was to use monitored anesthesia care                         (MAC). Immediately prior to administration of                         medications, the patient was re-assessed for adequacy                         to receive sedatives. The heart rate, respiratory                         rate, oxygen saturations, blood pressure, adequacy of                         pulmonary ventilation, and response to care were                         monitored throughout the procedure. The physical                         status of the patient was re-assessed after the                         procedure.                        After obtaining informed consent, the colonoscope was                         passed under direct vision. Throughout the procedure,                         the patient's blood pressure, pulse, and oxygen  saturations were monitored continuously. The                         Colonoscope was introduced through the anus and                         advanced to the the terminal ileum, with                         identification of the appendiceal orifice and IC                         valve. The quality of the bowel preparation was                         evaluated using the BBPS [facility] Bowel Preparation                         Scale) with scores of: Right Colon = 3, Transverse                         Colon = 3 and Left Colon = 3 (entire mucosa seen well                         with no residual staining, small fragments of stool or                         opaque liquid). The total  BBPS score equals 9. The                         terminal ileum, ileocecal valve, appendiceal orifice,                         and rectum were photographed. Findings:      The perianal and digital rectal examinations were normal. Pertinent       negatives include normal sphincter tone.      The terminal ileum appeared normal.      Three sessile polyps were found in the descending colon (2) and       transverse colon. The polyps were 1 to 2 mm in size. These polyps were       removed with a jumbo cold forceps. Resection and retrieval were       complete. Estimated blood loss was minimal.      Non-bleeding internal hemorrhoids were found during retroflexion. The       hemorrhoids were Grade I (internal hemorrhoids that do not prolapse).       Estimated blood loss: none.      The exam was otherwise without abnormality on direct and retroflexion       views. Impression:            - The examined portion of the ileum was normal.                        - Three 1 to 2 mm polyps in the descending colon and                         in the transverse colon,  removed with a jumbo cold                         forceps. Resected and retrieved.                        - Non-bleeding internal hemorrhoids.                        - The examination was otherwise normal on direct and                         retroflexion views. Recommendation:        - Patient has a contact number available for                         emergencies. The signs and symptoms of potential                         delayed complications were discussed with the patient.                         Return to normal activities tomorrow. Written                         discharge instructions were provided to the patient.                        - Discharge patient to home.                        - Resume previous diet.                        - Continue present medications.                        - Await pathology results.                         - Repeat colonoscopy for surveillance based on                         pathology results.                        - Return to referring physician as previously                         scheduled.                        - The findings and recommendations were discussed with                         the patient. Procedure Code(s):     --- Professional ---                        [phone], Colonoscopy, flexible; with biopsy, single or                         multiple Diagnosis  Code(s):     --- Professional ---                        Z86.010, Personal history of colonic polyps                        K64.0, First degree hemorrhoids                        D12.4, Benign neoplasm of descending colon                        D12.3, Benign neoplasm of transverse colon (hepatic                         flexure or splenic flexure) CPT copyright 2022 American Medical Association. All rights reserved. The codes documented in this report are preliminary and upon coder review may  be revised to meet current compliance requirements. Attending Participation:      I personally performed the entire procedure. Volney American, DO Annamaria Helling DO, DO 12/21/2022 11:45:23 AM This report has been signed electronically. Number of Addenda: 0 Note Initiated On: 12/21/2022 11:05 AM Scope Withdrawal Time: 0 hours 13 minutes 21 seconds  Total Procedure Duration: 0 hours 18 minutes 19 seconds  Estimated Blood Loss:  Estimated blood loss was minimal.      [facility]

## 2022-12-21 NOTE — Anesthesia Procedure Notes (Signed)
Date/Time: 12/21/2022 11:17 AM  Performed by: Doreen Salvage, CRNAPre-anesthesia Checklist: Patient identified, Emergency Drugs available, Suction available and Patient being monitored Patient Re-evaluated:Patient Re-evaluated prior to induction Oxygen Delivery Method: Nasal cannula Induction Type: IV induction Dental Injury: Teeth and Oropharynx as per pre-operative assessment  Comments: Nasal cannula with etCO2 monitoring

## 2022-12-21 NOTE — H&P (Signed)
Pre-Procedure H&P   Patient ID: Cathy Gray is a 57 y.o. female.  Gastroenterology Provider: Annamaria Helling, DO  Referring Provider: Dr. Leafy Ro PCP: Steele Sizer, MD  Date: 12/21/2022  HPI Cathy Gray is a 57 y.o. female who presents today for Colonoscopy for suveillance- phx colon polyps .  Patient last underwent colonoscopy in October 2018 demonstrated 2 tubular adenomatous polyps.  She is no family history of colon cancer, however, her father did have colon polyps.  She reports every other day bowel movement without melena or hematochezia.  She is status post hysterectomy with bladder sling.  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   Past Medical History:  Diagnosis Date   Allergy    Asthma    Bilateral cataracts    Breast mass, left 12/07/2015   Chronic sciatica of left side    Colicky right upper quadrant pain    Fever blister    Hay fever    Over weight     Past Surgical History:  Procedure Laterality Date   ABDOMINAL HYSTERECTOMY     BLADDER SUSPENSION  2007   CESAREAN SECTION  2001   COLONOSCOPY WITH PROPOFOL N/A 08/13/2017   Procedure: COLONOSCOPY WITH PROPOFOL;  Surgeon: Jonathon Bellows, MD;  Location: [facility] ENDOSCOPY;  Service: Gastroenterology;  Laterality: N/A;   DE QUERVAIN'S RELEASE Left 11/24/2020   renal abscess  1994    Family History No h/o GI disease or malignancy  Review of Systems  Constitutional:  Negative for activity change, appetite change, chills, diaphoresis, fatigue, fever and unexpected weight change.  HENT:  Negative for trouble swallowing and voice change.   Respiratory:  Negative for shortness of breath and wheezing.   Cardiovascular:  Negative for chest pain, palpitations and leg swelling.  Gastrointestinal:  Negative for abdominal distention, abdominal pain, anal bleeding, blood in stool, constipation, diarrhea, nausea, rectal pain and vomiting.  Skin:  Negative for  color change and pallor.  Neurological:  Negative for dizziness, syncope and weakness.  Psychiatric/Behavioral:  Negative for confusion.   All other systems reviewed and are negative.    Medications No current facility-administered medications on file prior to encounter.   Current Outpatient Medications on File Prior to Encounter  Medication Sig Dispense Refill   ADVAIR DISKUS 250-50 MCG/ACT AEPB Inhale 1 puff into the lungs in the morning and at bedtime. 60 each 5   Ascorbic Acid (VITAMIN C) 1000 MG tablet Take 1,000 mg by mouth daily.     Cholecalciferol (VITAMIN D) 50 MCG (2000 UT) CAPS Take 2,000 Units by mouth daily.     Cyanocobalamin (B-12) 1000 MCG SUBL Place 1 tablet under the tongue daily at 12 noon.     rosuvastatin (CRESTOR) 10 MG tablet Take 1 tablet (10 mg total) by mouth daily. 90 tablet 1   cyclobenzaprine (FLEXERIL) 5 MG tablet Take 1 tablet (5 mg total) by mouth 3 (three) times daily as needed. 30 tablet 0   DULoxetine (CYMBALTA) 30 MG capsule Take 1 capsule (30 mg total) by mouth daily. (Patient not taking: Reported on 12/21/2022) 90 capsule 0   levalbuterol (XOPENEX HFA) 45 MCG/ACT inhaler Inhale 2 puffs into the lungs 4 (four) times daily. 45 g 0   levocetirizine (XYZAL) 5 MG tablet TAKE 1 TABLET BY MOUTH EVERY DAY IN THE EVENING (Patient not taking: Reported on 12/21/2022) 90 tablet 1   meloxicam (MOBIC) 15 MG tablet Take 1 tablet (15 mg total) by  mouth daily. (Patient not taking: Reported on 12/21/2022) 30 tablet 0   Pediatric Multivit-Minerals-C (GUMMI BEAR MULTIVITAMIN/MIN) CHEW Chew 2 tablets by mouth daily.      Pertinent medications related to GI and procedure were reviewed by me with the patient prior to the procedure   Current Facility-Administered Medications:    0.9 %  sodium chloride infusion, , Intravenous, Continuous, Annamaria Helling, DO, Last Rate: 20 mL/hr at 12/21/22 0958, New Bag at 12/21/22 [id number]      Allergies  Allergen Reactions    Nitrofurantoin Anaphylaxis   Amoxicillin     rash   Gabapentin    Molds & Smuts    Montelukast Sodium     tachycardia   Nitrofurantoin Monohyd Macro Diarrhea, Itching and Nausea Only    Other reaction(s): Abdominal pain, Dizziness, Unconsciousness   Penicillin V Potassium Other (See Comments)   Pollen Extract    Allergies were reviewed by me prior to the procedure  Objective   Body mass index is 32.45 kg/m. Vitals:   12/21/22 0934 12/21/22 0936  BP:  (!) 155/97  Pulse:  97  Resp:  16  Temp:  (!) 96.8 F (36 C)  TempSrc:  Temporal  SpO2:  100%  Weight: 83.1 kg      Physical Exam Vitals and nursing note reviewed.  Constitutional:      General: She is not in acute distress.    Appearance: Normal appearance. She is not ill-appearing, toxic-appearing or diaphoretic.  HENT:     Head: Normocephalic and atraumatic.     Nose: Nose normal.     Mouth/Throat:     Mouth: Mucous membranes are moist.     Pharynx: Oropharynx is clear.  Eyes:     General: No scleral icterus.    Extraocular Movements: Extraocular movements intact.  Cardiovascular:     Rate and Rhythm: Normal rate and regular rhythm.     Heart sounds: Normal heart sounds. No murmur heard.    No friction rub. No gallop.  Pulmonary:     Effort: Pulmonary effort is normal. No respiratory distress.     Breath sounds: Normal breath sounds. No wheezing, rhonchi or rales.  Abdominal:     General: Bowel sounds are normal. There is no distension.     Palpations: Abdomen is soft.     Tenderness: There is no abdominal tenderness. There is no guarding or rebound.  Musculoskeletal:     Cervical back: Neck supple.     Right lower leg: No edema.     Left lower leg: No edema.  Skin:    General: Skin is warm and dry.     Coloration: Skin is not jaundiced or pale.  Neurological:     General: No focal deficit present.     Mental Status: She is alert and oriented to person, place, and time. Mental status is at baseline.   Psychiatric:        Mood and Affect: Mood normal.        Behavior: Behavior normal.        Thought Content: Thought content normal.        Judgment: Judgment normal.      Assessment:  Cathy Gray is a 57 y.o. female  who presents today for Colonoscopy for suveillance- phx colon polyps .  Plan:  Colonoscopy with possible intervention today  Colonoscopy with possible biopsy, control of bleeding, polypectomy, and interventions as necessary has been discussed with the patient/patient representative. Informed consent was obtained  from the patient/patient representative after explaining the indication, nature, and risks of the procedure including but not limited to death, bleeding, perforation, missed neoplasm/lesions, cardiorespiratory compromise, and reaction to medications. Opportunity for questions was given and appropriate answers were provided. Patient/patient representative has verbalized understanding is amenable to undergoing the procedure.   Annamaria Helling, DO  [facility] Gastroenterology  Portions of the record may have been created with voice recognition software. Occasional wrong-word or 'sound-a-like' substitutions may have occurred due to the inherent limitations of voice recognition software.  Read the chart carefully and recognize, using context, where substitutions may have occurred.

## 2022-12-21 NOTE — Anesthesia Preprocedure Evaluation (Signed)
Anesthesia Evaluation  Patient identified by MRN, date of birth, ID band Patient awake    Reviewed: Allergy & Precautions, H&P , NPO status , Patient's Chart, lab work & pertinent test results, reviewed documented beta blocker date and time   History of Anesthesia Complications Negative for: history of anesthetic complications  Airway Mallampati: II  TM Distance: >3 FB Neck ROM: full    Dental  (+) Dental Advidsory Given, Missing, Teeth Intact   Pulmonary neg shortness of breath, asthma , neg sleep apnea, neg COPD, neg recent URI   Pulmonary exam normal breath sounds clear to auscultation       Cardiovascular Exercise Tolerance: Good negative cardio ROS Normal cardiovascular exam Rhythm:regular Rate:Normal     Neuro/Psych negative neurological ROS  negative psych ROS   GI/Hepatic negative GI ROS, Neg liver ROS,,,  Endo/Other  negative endocrine ROS    Renal/GU negative Renal ROS  negative genitourinary   Musculoskeletal   Abdominal   Peds  Hematology negative hematology ROS (+)   Anesthesia Other Findings Past Medical History: No date: Allergy No date: Asthma No date: Bilateral cataracts 12/07/2015: Breast mass, left No date: Chronic sciatica of left side No date: Colicky right upper quadrant pain No date: Fever blister No date: Hay fever No date: Over weight   Reproductive/Obstetrics negative OB ROS                             Anesthesia Physical Anesthesia Plan  ASA: 2  Anesthesia Plan: General   Post-op Pain Management:    Induction: Intravenous  PONV Risk Score and Plan: 3 and Propofol infusion and TIVA  Airway Management Planned: Natural Airway and Nasal Cannula  Additional Equipment:   Intra-op Plan:   Post-operative Plan:   Informed Consent: I have reviewed the patients History and Physical, chart, labs and discussed the procedure including the risks,  benefits and alternatives for the proposed anesthesia with the patient or authorized representative who has indicated his/her understanding and acceptance.     Dental Advisory Given  Plan Discussed with: Anesthesiologist, CRNA and Surgeon  Anesthesia Plan Comments:        Anesthesia Quick Evaluation

## 2022-12-22 ENCOUNTER — Encounter: Payer: Self-pay | Admitting: Gastroenterology

## 2022-12-22 LAB — SURGICAL PATHOLOGY

## 2022-12-28 NOTE — Anesthesia Postprocedure Evaluation (Signed)
Anesthesia Post Note  Patient: Cathy Gray  Procedure(s) Performed: COLONOSCOPY WITH PROPOFOL  Patient location during evaluation: Endoscopy Anesthesia Type: General Level of consciousness: awake and alert Pain management: pain level controlled Vital Signs Assessment: post-procedure vital signs reviewed and stable Respiratory status: spontaneous breathing, nonlabored ventilation, respiratory function stable and patient connected to nasal cannula oxygen Cardiovascular status: blood pressure returned to baseline and stable Postop Assessment: no apparent nausea or vomiting Anesthetic complications: no   No notable events documented.   Last Vitals:  Vitals:   12/21/22 1200 12/21/22 1210  BP: 132/78 (!) 156/68  Pulse: 71 70  Resp: 14 16  Temp:    SpO2: 100%     Last Pain:  Vitals:   12/21/22 1145  TempSrc: Temporal                 Martha Clan

## 2023-02-13 NOTE — Progress Notes (Unsigned)
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 Marcell Barlow and was advised against decompression, she also saw  Dr. Andee Poles who prescribed HCTZ 25  for the tinnitus but she stopped it also due to side effects - and did not help . Tinnitus still bad and on the left side, she is doing white noise but is frustrating since nothing that can help with symptoms   Morbid obesity: BMI was  above 35 with dyslipidemia and DDD spine, She had tried [employer], [employer], [employer] , 21 day fix diet helped her lose 30 lbs a long time ago but when she stopped she gained it right back. We started her on Qsymia and she was tolerating medication well. Start weight was 193 lbs on 06/01/2021 and today it is down to 182 lbs but is out of medication for the past two weeks due to cost -  no longer covered by insurance.  She had more energy whitle taking medication, she still  avoiding carbs. Discussed GLP-1 agonist, she denies personal history of pancreatitis ( had to be admitted for an ulcer in the past) ,  sister had thyroid cancer but not medullary. She started Rybelsus Dec 2022 and  lost weight down to 177 lbs, however she was not able to sleep while taking it,  weight went up to 187 lbs and has been stable since July at 186 lbs. She joined [employer] but the cost is too high now , she is going to switch gyms. She states her son Gerilyn Pilgrim is getting married next year   Asthma: she is on Advair and denies side effects,  she states no cough, wheezing , SOB , doing well on medication. She needs a rescue inhaler   Chronic back pain with intermittent flares: she takes  Flexeril and Meloxicam prn,  she stopped seeing  chiropractor in [place] but will go back when he gets his office here in [place].  Adjustments helped with the pain.  She has constant left lower back pain but no radiculitis Unchanged   Dyslipidemia: taking Crestor and denies side effects, last LDL was down from 187 to 72, continue medication. No side effects  Paresthesia: both hands but left worse than right side, noticed some numbness on both hands that is worse at night, sometimes wakes her up, not radiating to arms . No weakness. She shakes her hands to improve symptoms. It is described as pin and needles and intermittent . She also has difficulty flexing her thumbs, left worse than right side. She was seen by Ortho and is supposed to have surgery but husband will be out of town for the next two months   Vitamin D and B12 deficiency: feeling tired and we will recheck levels.   Knee pain; she has noticed aching on both knees intermittently throughout the day. She is avoiding taking meloxicam daily but it does help with the pain  Menopausal symptoms: she had a hysterectomy years ago but noticing worsening of hot flashes over the past couple of months, also worried about weight gain, mood is good   Patient Active Problem List   Diagnosis Date Noted   Bilateral anterior knee pain 08/10/2022   Insulin resistance 08/10/2022   Chronic  left-sided low back pain with left-sided sciatica 08/10/2022   Menopause syndrome 08/10/2022   Dyslipidemia 08/10/2022   Chiari I malformation 08/10/2022   Strain of extensor muscle, fascia and tendon of left index finger at wrist and hand level, initial encounter 08/03/2020   Ganglion cyst 08/03/2020   Trochanteric bursitis of right hip 07/27/2020   History of COVID-19 09/13/2019   Plantar fasciitis of left foot 09/30/2018   Bulging lumbar disc 05/14/2018   Fasting hyperglycemia 04/18/2018   Vitamin D deficiency 04/18/2018   B12 deficiency 04/18/2018   Obesity (BMI 30.0-34.9) 05/10/2017   Asthma, moderate 07/30/2015   Bilateral cataracts 07/30/2015   Neuralgia neuritis, sciatic nerve  07/30/2015   Recurrent UTI 07/30/2015    Past Surgical History:  Procedure Laterality Date   ABDOMINAL HYSTERECTOMY     BLADDER SUSPENSION  2007   CESAREAN SECTION  2001   COLONOSCOPY WITH PROPOFOL N/A 08/13/2017   Procedure: COLONOSCOPY WITH PROPOFOL;  Surgeon: Wyline Mood, MD;  Location: [facility] ENDOSCOPY;  Service: Gastroenterology;  Laterality: N/A;   COLONOSCOPY WITH PROPOFOL N/A 12/21/2022   Procedure: COLONOSCOPY WITH PROPOFOL;  Surgeon: Jaynie Collins, DO;  Location: [facility] ENDOSCOPY;  Service: Gastroenterology;  Laterality: N/A;   DE QUERVAIN'S RELEASE Left 11/24/2020   renal abscess  1994    Family History  Problem Relation Age of Onset   Heart disease Father    Asthma Father    Diverticulosis Father    Cancer Sister        Thyroid   Anxiety disorder Daughter    OCD Daughter    Hyperlipidemia Maternal Grandmother    Diverticulosis Maternal Grandmother    Diabetes Maternal Grandfather    Asthma Paternal Grandmother    Hyperlipidemia Paternal Grandmother    Seizures Son    Breast cancer Cousin 32       pat 1st     Social History   Tobacco Use   Smoking status: Never   Smokeless tobacco: Never  Substance Use Topics   Alcohol use: Yes    Alcohol/week: 0.0 standard drinks of alcohol    Comment: occasionally     Current Outpatient Medications:    ADVAIR DISKUS 250-50 MCG/ACT AEPB, Inhale 1 puff into the lungs in the morning and at bedtime., Disp: 60 each, Rfl: 5   Ascorbic Acid (VITAMIN C) 1000 MG tablet, Take 1,000 mg by mouth daily., Disp: , Rfl:    Cholecalciferol (VITAMIN D) 50 MCG (2000 UT) CAPS, Take 2,000 Units by mouth daily., Disp: , Rfl:    Cyanocobalamin (B-12) 1000 MCG SUBL, Place 1 tablet under the tongue daily at 12 noon., Disp: , Rfl:    cyclobenzaprine (FLEXERIL) 5 MG tablet, Take 1 tablet (5 mg total) by mouth 3 (three) times daily as needed., Disp: 30 tablet, Rfl: 0   DULoxetine (CYMBALTA) 30 MG capsule, Take 1 capsule (30 mg total) by mouth  daily. (Patient not taking: Reported on 12/21/2022), Disp: 90 capsule, Rfl: 0   levalbuterol (XOPENEX HFA) 45 MCG/ACT inhaler, Inhale 2 puffs into the lungs 4 (four) times daily., Disp: 45 g, Rfl: 0   levocetirizine (XYZAL) 5 MG tablet, TAKE 1 TABLET BY MOUTH EVERY DAY IN THE EVENING (Patient not taking: Reported on 12/21/2022), Disp: 90 tablet, Rfl: 1   meloxicam (MOBIC) 15 MG tablet, Take 1 tablet (15 mg total) by mouth daily. (Patient not taking: Reported on 12/21/2022), Disp: 30 tablet, Rfl: 0   Pediatric Multivit-Minerals-C (GUMMI BEAR MULTIVITAMIN/MIN) CHEW, Chew 2 tablets by mouth daily.,  Disp: , Rfl:    rosuvastatin (CRESTOR) 10 MG tablet, Take 1 tablet (10 mg total) by mouth daily., Disp: 90 tablet, Rfl: 1  Allergies  Allergen Reactions   Nitrofurantoin Anaphylaxis   Amoxicillin     rash   Gabapentin    Molds & Smuts    Montelukast Sodium     tachycardia   Nitrofurantoin Monohyd Macro Diarrhea, Itching and Nausea Only    Other reaction(s): Abdominal pain, Dizziness, Unconsciousness   Penicillin V Potassium Other (See Comments)   Pollen Extract     I personally reviewed active problem list, medication list, allergies, family history, social history with the patient/caregiver today.   ROS  ***  Objective  There were no vitals filed for this visit.  There is no height or weight on file to calculate BMI.  Physical Exam ***  Recent Results (from the past 2160 hour(s))  Surgical pathology     Status: None   Collection Time: 12/21/22 11:08 AM  Result Value Ref Range   SURGICAL PATHOLOGY      SURGICAL PATHOLOGY CASE: [id number] PATIENT: Maud DeedWENDY Trull Surgical Pathology Report     Specimen Submitted: A. Colon polyp, transverse; cbx B. Colon polyp x2, descending; cbx  Clinical History: Personal history adenomatous polyp.  Colon polyps      DIAGNOSIS: A.  COLON, TRANSVERSE, POLYP; BIOPSY: - SESSILE SERRATED POLYP. - NO EVIDENCE OF HIGH-GRADE DYSPLASIA OR  MALIGNANCY.  B.  COLON, DESCENDING, POLYPS; BIOPSIES: - 1 FRAGMENT OF TUBULAR ADENOMA. - 1 FRAGMENT WITH FEATURES SUGGESTIVE OF DEVELOPING SESSILE SERRATED POLYP. - NO EVIDENCE OF HIGH-GRADE DYSPLASIA OR MALIGNANCY.  GROSS DESCRIPTION: A. Labeled: Cbx transverse colon polyp x 1 Received: Formalin Collection time: 11:08 AM on 12/21/2022 Placed into formalin time: 11:08 AM on 12/21/2022 Tissue fragment(s): 1 Size: 0.5 x 0.4 x 0.1 cm Description: Tan soft tissue fragment Entirely submitted in 1 cassette.  B. Labeled: Cbx descending colon polyp x 2 Received: Formalin Collection time: 11:38 A M on 12/21/2022 Placed into formalin time: 11:38 AM on 12/21/2022 Tissue fragment(s): 2 Size: Ranges from 0.4-0.5 cm Description: Tan soft tissue fragments Entirely submitted in 1 cassette.  CM 12/21/2022  Final Diagnosis performed by Alcario DroughtAnne Geyer, MD.   Electronically signed 12/22/2022 1:59:00PM The electronic signature indicates that the named Attending Pathologist has evaluated the specimen Technical component performed at [facility], [address], [place], [place] [address] Lab: [phone] Dir: Jolene SchimkeSanjai Nagendra, MD, MMM  Professional component performed at [facility], [facility], [address], [place], [place] [address] Lab: [phone] Dir: Beryle QuantHeath M. Jones, MD     PHQ2/9:    08/10/2022    8:01 AM 05/22/2022   11:40 AM 02/08/2022    9:43 AM 10/28/2021    8:55 AM 07/25/2021    7:41 AM  Depression screen PHQ 2/9  Decreased Interest 0 0 0 0 0  Down, Depressed, Hopeless 0 0 0 0 0  PHQ - 2 Score 0 0 0 0 0  Altered sleeping 0  0 0   Tired, decreased energy 0  0 0   Change in appetite 0  0 0   Feeling bad or failure about yourself  0  0 0   Trouble concentrating 0  0 0   Moving slowly or fidgety/restless 0  0 0   Suicidal thoughts 0  0 0   PHQ-9 Score 0  0 0     phq 9 is {gen pos [id number]}neg:[id number]}   Fall Risk:    08/10/2022  8:01 AM 05/22/2022   11:39 AM 02/08/2022     9:43 AM 10/28/2021    8:55 AM 07/25/2021    7:41 AM  Fall Risk   Falls in the past year? 0 0 0 0 0  Number falls in past yr: 0 0 0 0 0  Injury with Fall? 0 0 0 0 0  Risk for fall due to : No Fall Risks  No Fall Risks No Fall Risks No Fall Risks  Follow up Falls prevention discussed Falls evaluation completed Falls prevention discussed Falls prevention discussed Falls prevention discussed      Functional Status Survey:      Assessment & Plan  *** There are no diagnoses linked to this encounter.

## 2023-02-14 ENCOUNTER — Ambulatory Visit: Payer: BC Managed Care – PPO | Admitting: Family Medicine

## 2023-02-14 ENCOUNTER — Other Ambulatory Visit: Payer: Self-pay | Admitting: Family Medicine

## 2023-02-14 ENCOUNTER — Encounter: Payer: Self-pay | Admitting: Family Medicine

## 2023-02-14 VITALS — BP 118/76 | HR 91 | Resp 16 | Ht 62.0 in | Wt 182.0 lb

## 2023-02-14 DIAGNOSIS — E669 Obesity, unspecified: Secondary | ICD-10-CM

## 2023-02-14 DIAGNOSIS — E559 Vitamin D deficiency, unspecified: Secondary | ICD-10-CM

## 2023-02-14 DIAGNOSIS — E785 Hyperlipidemia, unspecified: Secondary | ICD-10-CM

## 2023-02-14 DIAGNOSIS — J4540 Moderate persistent asthma, uncomplicated: Secondary | ICD-10-CM

## 2023-02-14 DIAGNOSIS — G935 Compression of brain: Secondary | ICD-10-CM

## 2023-02-14 DIAGNOSIS — M5442 Lumbago with sciatica, left side: Secondary | ICD-10-CM

## 2023-02-14 DIAGNOSIS — R7301 Impaired fasting glucose: Secondary | ICD-10-CM

## 2023-02-14 DIAGNOSIS — E538 Deficiency of other specified B group vitamins: Secondary | ICD-10-CM | POA: Diagnosis not present

## 2023-02-14 DIAGNOSIS — N951 Menopausal and female climacteric states: Secondary | ICD-10-CM

## 2023-02-14 DIAGNOSIS — J3089 Other allergic rhinitis: Secondary | ICD-10-CM

## 2023-02-14 DIAGNOSIS — G8929 Other chronic pain: Secondary | ICD-10-CM

## 2023-02-14 MED ORDER — DULOXETINE HCL 30 MG PO CPEP: 30.0000 mg | ORAL_CAPSULE | Freq: Every day | ORAL | 0 refills | Status: DC

## 2023-02-14 MED ORDER — PHENTERMINE HCL 37.5 MG PO TABS: 37.5000 mg | ORAL_TABLET | Freq: Every day | ORAL | 0 refills | Status: DC

## 2023-02-14 MED ORDER — LEVOCETIRIZINE DIHYDROCHLORIDE 5 MG PO TABS: 5.0000 mg | ORAL_TABLET | Freq: Every evening | ORAL | 1 refills | Status: DC

## 2023-02-14 MED ORDER — ADVAIR DISKUS 250-50 MCG/ACT IN AEPB: 1.0000 | INHALATION_SPRAY | Freq: Two times a day (BID) | RESPIRATORY_TRACT | 5 refills | Status: DC

## 2023-02-14 MED ORDER — ROSUVASTATIN CALCIUM 10 MG PO TABS: 10.0000 mg | ORAL_TABLET | Freq: Every day | ORAL | 1 refills | Status: DC

## 2023-02-15 LAB — COMPLETE METABOLIC PANEL WITHOUT GFR
AG Ratio: 1.7 (calc) (ref 1.0–2.5)
ALT: 18 U/L (ref 6–29)
AST: 16 U/L (ref 10–35)
Albumin: 4.5 g/dL (ref 3.6–5.1)
Alkaline phosphatase (APISO): 75 U/L (ref 37–153)
BUN: 19 mg/dL (ref 7–25)
CO2: 31 mmol/L (ref 20–32)
Calcium: 10.1 mg/dL (ref 8.6–10.4)
Chloride: 103 mmol/L (ref 98–110)
Creat: 0.73 mg/dL (ref 0.50–1.03)
Globulin: 2.6 g/dL (ref 1.9–3.7)
Glucose, Bld: 100 mg/dL — ABNORMAL HIGH (ref 65–99)
Potassium: 5 mmol/L (ref 3.5–5.3)
Sodium: 142 mmol/L (ref 135–146)
Total Bilirubin: 0.7 mg/dL (ref 0.2–1.2)
Total Protein: 7.1 g/dL (ref 6.1–8.1)
eGFR: 96 mL/min/1.73m2

## 2023-02-15 LAB — CBC WITH DIFFERENTIAL/PLATELET
Absolute Monocytes: 454 {cells}/uL (ref 200–950)
Basophils Absolute: 28 {cells}/uL (ref 0–200)
Basophils Relative: 0.5 %
Eosinophils Absolute: 112 {cells}/uL (ref 15–500)
Eosinophils Relative: 2 %
HCT: 41.6 % (ref 35.0–45.0)
Hemoglobin: 14.1 g/dL (ref 11.7–15.5)
Lymphs Abs: 1596 {cells}/uL (ref 850–3900)
MCH: 31.3 pg (ref 27.0–33.0)
MCHC: 33.9 g/dL (ref 32.0–36.0)
MCV: 92.2 fL (ref 80.0–100.0)
MPV: 10.2 fL (ref 7.5–12.5)
Monocytes Relative: 8.1 %
Neutro Abs: 3410 {cells}/uL (ref 1500–7800)
Neutrophils Relative %: 60.9 %
Platelets: 272 Thousand/uL (ref 140–400)
RBC: 4.51 Million/uL (ref 3.80–5.10)
RDW: 11.9 % (ref 11.0–15.0)
Total Lymphocyte: 28.5 %
WBC: 5.6 Thousand/uL (ref 3.8–10.8)

## 2023-02-15 LAB — LIPID PANEL
Cholesterol: 196 mg/dL
HDL: 73 mg/dL
LDL Cholesterol (Calc): 103 mg/dL — ABNORMAL HIGH
Non-HDL Cholesterol (Calc): 123 mg/dL
Total CHOL/HDL Ratio: 2.7 (calc)
Triglycerides: 103 mg/dL

## 2023-02-15 LAB — HEMOGLOBIN A1C
Hgb A1c MFr Bld: 5.9 % of total Hgb — ABNORMAL HIGH (ref <5.7)
Mean Plasma Glucose: 123 mg/dL
eAG (mmol/L): 6.8 mmol/L

## 2023-02-15 LAB — VITAMIN D 25 HYDROXY (VIT D DEFICIENCY, FRACTURES): Vit D, 25-Hydroxy: 25 ng/mL — ABNORMAL LOW (ref 30–100)

## 2023-02-15 LAB — VITAMIN B12: Vitamin B-12: 478 pg/mL (ref 200–1100)

## 2023-02-23 ENCOUNTER — Telehealth: Payer: Self-pay | Admitting: Family Medicine

## 2023-02-23 NOTE — Telephone Encounter (Signed)
CB- [phone] or [phone]  Pt is calling ask what was DULoxetine (CYMBALTA) 30 MG capsule [[id number]]  prescribed for? Pharmacist stated depression. Pt reports did not talk about depression. Is the medication used for something else?

## 2023-02-23 NOTE — Telephone Encounter (Signed)
Returned call to patient and reminded her that Dr. Carlynn Purl had prescribed it for help managing her menopausal symptoms. Patient remembered and expressed apology for getting confused. Patient was reassured and all questions/concerns were addressed and she was pleased at conclusion of call.

## 2023-03-21 ENCOUNTER — Other Ambulatory Visit: Payer: Self-pay | Admitting: Family Medicine

## 2023-03-21 DIAGNOSIS — E669 Obesity, unspecified: Secondary | ICD-10-CM

## 2023-03-26 NOTE — Progress Notes (Deleted)
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 Marcell Barlow and was advised against decompression, she also saw  Dr. Andee Poles who prescribed HCTZ 25  for the tinnitus but she stopped it also due to side effects - and did not help . Tinnitus still bad and on the left side, she is doing white noise but is frustrating since nothing that can help with symptoms Unchanged   Morbid obesity: BMI was  above 35 with dyslipidemia and DDD spine, She had tried [employer], [employer], [employer] , 21 day fix diet helped her lose 30 lbs a long time ago but when she stopped she gained it right back. We started her on Qsymia and she was tolerating medication well. Start weight was 193 lbs on 06/01/2021 and today it is down to 182 lbs but is out of medication for the past two weeks due to cost -  no longer covered by insurance.  She had more energy whitle taking medication, she still  avoiding carbs. Discussed GLP-1 agonist, she denies personal history of pancreatitis ( had to be admitted for an ulcer in the past) ,  sister had thyroid cancer but not medullary. She started Rybelsus Dec 2022 and  lost weight down to 177 lbs, however she was not able to sleep while taking it,  weight went up to 187 lbs cutting down on Calorie intake. Her son is getting married in July today weight is 182 lbs, she would like to lose weight prior to the wedding.   Asthma: she is on Advair and denies side effects,  she states no cough, wheezing , SOB , doing well on medication. She  uses rescue inhaler very seldom   Chronic back pain with intermittent flares: she takes  Flexeril and Meloxicam prn,  she stopped seeing  chiropractor in [place] but will go back when he gets his office here in [place]. Adjustments  helped with the pain.  She has constant left lower back pain but no radiculitis Stable   Dyslipidemia: taking Crestor and denies side effects, last LDL was down from 187 to 72, continue medication.  We will recheck labs today   Paresthesia: both hands but left worse than right side, noticed some numbness on both hands that is worse at night, sometimes wakes her up, not radiating to arms . No weakness. She shakes her hands to improve symptoms. It is described as pin and needles and intermittent . She also has difficulty flexing her thumbs, left worse than right side. She was seen by Ortho and is supposed to have surgery but husband will be out of town for the next two months   Vitamin D and B12 deficiency: feeling tired and we will recheck levels.   Knee pain; she has noticed aching on both knees intermittently throughout the day. She is avoiding taking meloxicam daily , she also has aching hips that is worse at night   Menopausal symptoms: she had a hysterectomy years ago but noticing worsening of hot flashes since last year, , also worried about weight gain. We tried Duloxetine but she is not sure why she stopped taking it, she would like to try it again   Patient Active Problem List   Diagnosis  Date Noted   Bilateral anterior knee pain 08/10/2022   Insulin resistance 08/10/2022   Chronic left-sided low back pain with left-sided sciatica 08/10/2022   Menopause syndrome 08/10/2022   Dyslipidemia 08/10/2022   Chiari I malformation (HCC) 08/10/2022   Strain of extensor muscle, fascia and tendon of left index finger at wrist and hand level, initial encounter 08/03/2020   Ganglion cyst 08/03/2020   Trochanteric bursitis of right hip 07/27/2020   History of COVID-19 09/13/2019   Plantar fasciitis of left foot 09/30/2018   Bulging lumbar disc 05/14/2018   Fasting hyperglycemia 04/18/2018   Vitamin D deficiency 04/18/2018   B12 deficiency 04/18/2018   Obesity (BMI 30.0-34.9) 05/10/2017    Asthma, moderate 07/30/2015   Bilateral cataracts 07/30/2015   Neuralgia neuritis, sciatic nerve 07/30/2015   Recurrent UTI 07/30/2015    Past Surgical History:  Procedure Laterality Date   ABDOMINAL HYSTERECTOMY     BLADDER SUSPENSION  2007   CESAREAN SECTION  2001   COLONOSCOPY WITH PROPOFOL N/A 08/13/2017   Procedure: COLONOSCOPY WITH PROPOFOL;  Surgeon: Wyline Mood, MD;  Location: [facility] ENDOSCOPY;  Service: Gastroenterology;  Laterality: N/A;   COLONOSCOPY WITH PROPOFOL N/A 12/21/2022   Procedure: COLONOSCOPY WITH PROPOFOL;  Surgeon: Jaynie Collins, DO;  [facility] ENDOSCOPY;  Service: Gastroenterology;  Laterality: N/A;   DE QUERVAIN'S RELEASE Left 11/24/2020   renal abscess  1994    Family History  Problem Relation Age of Onset   Heart disease Father    Asthma Father    Diverticulosis Father    Cancer Sister        Thyroid   Anxiety disorder Daughter    OCD Daughter    Hyperlipidemia Maternal Grandmother    Diverticulosis Maternal Grandmother    Diabetes Maternal Grandfather    Asthma Paternal Grandmother    Hyperlipidemia Paternal Grandmother    Seizures Son    Breast cancer Cousin 32       pat 1st     Social History   Tobacco Use   Smoking status: Never   Smokeless tobacco: Never  Substance Use Topics   Alcohol use: Yes    Alcohol/week: 0.0 standard drinks of alcohol    Comment: occasionally     Current Outpatient Medications:    Ascorbic Acid (VITAMIN C) 1000 MG tablet, Take 1,000 mg by mouth daily., Disp: , Rfl:    Cholecalciferol (VITAMIN D) 50 MCG (2000 UT) CAPS, Take 2,000 Units by mouth daily., Disp: , Rfl:    Cyanocobalamin (B-12) 1000 MCG SUBL, Place 1 tablet under the tongue daily at 12 noon., Disp: , Rfl:    cyclobenzaprine (FLEXERIL) 5 MG tablet, Take 1 tablet (5 mg total) by mouth 3 (three) times daily as needed., Disp: 30 tablet, Rfl: 0   DULoxetine (CYMBALTA) 30 MG capsule, Take 1-2 capsules (30-60 mg total) by mouth daily. Second  week take two, Disp: 60 capsule, Rfl: 0   fluticasone-salmeterol (WIXELA INHUB) 250-50 MCG/ACT AEPB, Inhale 1 puff into the lungs in the morning and at bedtime., Disp: 180 each, Rfl: 1   levalbuterol (XOPENEX HFA) 45 MCG/ACT inhaler, Inhale 2 puffs into the lungs 4 (four) times daily., Disp: 45 g, Rfl: 0   levocetirizine (XYZAL) 5 MG tablet, Take 1 tablet (5 mg total) by mouth every evening., Disp: 90 tablet, Rfl: 1   meloxicam (MOBIC) 15 MG tablet, Take 1 tablet (15 mg total) by mouth daily., Disp: 30 tablet, Rfl: 0   Pediatric Multivit-Minerals-C (GUMMI BEAR MULTIVITAMIN/MIN) CHEW, Chew  2 tablets by mouth daily., Disp: , Rfl:    phentermine (ADIPEX-P) 37.5 MG tablet, TAKE 1 TABLET BY MOUTH DAILY BEFORE BREAKFAST, Disp: 30 tablet, Rfl: 0   rosuvastatin (CRESTOR) 10 MG tablet, Take 1 tablet (10 mg total) by mouth daily., Disp: 90 tablet, Rfl: 1  Allergies  Allergen Reactions   Nitrofurantoin Anaphylaxis   Amoxicillin     rash   Gabapentin    Molds & Smuts    Montelukast Sodium     tachycardia   Nitrofurantoin Monohyd Macro Diarrhea, Itching and Nausea Only    Other reaction(s): Abdominal pain, Dizziness, Unconsciousness   Penicillin V Potassium Other (See Comments)   Pollen Extract     I personally reviewed active problem list, medication list, allergies, family history, social history, health maintenance with the patient/caregiver today.   ROS  ***  Objective  There were no vitals filed for this visit.  There is no height or weight on file to calculate BMI.  Physical Exam ***   PHQ2/9:    02/14/2023    8:08 AM 08/10/2022    8:01 AM 05/22/2022   11:40 AM 02/08/2022    9:43 AM 10/28/2021    8:55 AM  Depression screen PHQ 2/9  Decreased Interest 0 0 0 0 0  Down, Depressed, Hopeless 0 0 0 0 0  PHQ - 2 Score 0 0 0 0 0  Altered sleeping 3 0  0 0  Tired, decreased energy 0 0  0 0  Change in appetite 0 0  0 0  Feeling bad or failure about yourself  0 0  0 0  Trouble  concentrating 0 0  0 0  Moving slowly or fidgety/restless 0 0  0 0  Suicidal thoughts 0 0  0 0  PHQ-9 Score 3 0  0 0    phq 9 is {gen pos [id number]}   Fall Risk:    02/14/2023    8:08 AM 08/10/2022    8:01 AM 05/22/2022   11:39 AM 02/08/2022    9:43 AM 10/28/2021    8:55 AM  Fall Risk   Falls in the past year? 0 0 0 0 0  Number falls in past yr: 0 0 0 0 0  Injury with Fall? 0 0 0 0 0  Risk for fall due to : No Fall Risks No Fall Risks  No Fall Risks No Fall Risks  Follow up Falls prevention discussed Falls prevention discussed Falls evaluation completed Falls prevention discussed Falls prevention discussed      Functional Status Survey:      Assessment & Plan  *** There are no diagnoses linked to this encounter.

## 2023-03-27 ENCOUNTER — Ambulatory Visit: Payer: BC Managed Care – PPO | Admitting: Family Medicine

## 2023-04-12 ENCOUNTER — Other Ambulatory Visit: Payer: Self-pay | Admitting: Obstetrics and Gynecology

## 2023-04-12 DIAGNOSIS — Z1231 Encounter for screening mammogram for malignant neoplasm of breast: Secondary | ICD-10-CM

## 2023-04-20 ENCOUNTER — Ambulatory Visit
Admission: RE | Admit: 2023-04-20 | Discharge: 2023-04-20 | Disposition: A | Discharge status: Home / Self Care | Payer: BC Managed Care – PPO | Source: Ambulatory Visit | Attending: Obstetrics and Gynecology | Admitting: Obstetrics and Gynecology

## 2023-04-20 DIAGNOSIS — Z1231 Encounter for screening mammogram for malignant neoplasm of breast: Secondary | ICD-10-CM | POA: Diagnosis present

## 2023-05-01 NOTE — Progress Notes (Deleted)
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 Marcell Barlow and was advised against decompression, she also saw  Dr. Andee Poles who prescribed HCTZ 25  for the tinnitus but she stopped it also due to side effects - and did not help . Tinnitus still bad and on the left side, she is doing white noise but is frustrating since nothing that can help with symptoms Unchanged   Morbid obesity: BMI was  above 35 with dyslipidemia and DDD spine, She had tried [employer], [employer], [employer] , 21 day fix diet helped her lose 30 lbs a long time ago but when she stopped she gained it right back. We started her on Qsymia and she was tolerating medication well. Start weight was 193 lbs on 06/01/2021 and today it is down to 182 lbs but is out of medication for the past two weeks due to cost -  no longer covered by insurance.  She had more energy whitle taking medication, she still  avoiding carbs. Discussed GLP-1 agonist, she denies personal history of pancreatitis ( had to be admitted for an ulcer in the past) ,  sister had thyroid cancer but not medullary. She started Rybelsus Dec 2022 and  lost weight down to 177 lbs, however she was not able to sleep while taking it,  weight went up to 187 lbs cutting down on Calorie intake. Her son is getting married in July today weight is 182 lbs, she would like to lose weight prior to the wedding.   Asthma: she is on Advair and denies side effects,  she states no cough, wheezing , SOB , doing well on medication. She  uses rescue inhaler very seldom   Chronic back pain with intermittent flares: she takes  Flexeril and Meloxicam prn,  she stopped seeing  chiropractor in [place] but will go back when he gets his office here in [place]. Adjustments  helped with the pain.  She has constant left lower back pain but no radiculitis Stable   Dyslipidemia: taking Crestor and denies side effects, last LDL was down from 187 to 72, continue medication.  We will recheck labs today   Paresthesia: both hands but left worse than right side, noticed some numbness on both hands that is worse at night, sometimes wakes her up, not radiating to arms . No weakness. She shakes her hands to improve symptoms. It is described as pin and needles and intermittent . She also has difficulty flexing her thumbs, left worse than right side. She was seen by Ortho and is supposed to have surgery but husband will be out of town for the next two months   Vitamin D and B12 deficiency: feeling tired and we will recheck levels.   Knee pain; she has noticed aching on both knees intermittently throughout the day. She is avoiding taking meloxicam daily , she also has aching hips that is worse at night   Menopausal symptoms: she had a hysterectomy years ago but noticing worsening of hot flashes since last year, , also worried about weight gain. We tried Duloxetine but she is not sure why she stopped taking it, she would like to try it again   Patient Active Problem List   Diagnosis  Date Noted   Bilateral anterior knee pain 08/10/2022   Insulin resistance 08/10/2022   Chronic left-sided low back pain with left-sided sciatica 08/10/2022   Menopause syndrome 08/10/2022   Dyslipidemia 08/10/2022   Chiari I malformation (HCC) 08/10/2022   Strain of extensor muscle, fascia and tendon of left index finger at wrist and hand level, initial encounter 08/03/2020   Ganglion cyst 08/03/2020   Trochanteric bursitis of right hip 07/27/2020   History of COVID-19 09/13/2019   Plantar fasciitis of left foot 09/30/2018   Bulging lumbar disc 05/14/2018   Fasting hyperglycemia 04/18/2018   Vitamin D deficiency 04/18/2018   B12 deficiency 04/18/2018   Obesity (BMI 30.0-34.9) 05/10/2017    Asthma, moderate 07/30/2015   Bilateral cataracts 07/30/2015   Neuralgia neuritis, sciatic nerve 07/30/2015   Recurrent UTI 07/30/2015    Past Surgical History:  Procedure Laterality Date   ABDOMINAL HYSTERECTOMY     BLADDER SUSPENSION  2007   CESAREAN SECTION  2001   COLONOSCOPY WITH PROPOFOL N/A 08/13/2017   Procedure: COLONOSCOPY WITH PROPOFOL;  Surgeon: Wyline Mood, MD;  Location: [facility] ENDOSCOPY;  Service: Gastroenterology;  Laterality: N/A;   COLONOSCOPY WITH PROPOFOL N/A 12/21/2022   Procedure: COLONOSCOPY WITH PROPOFOL;  Surgeon: Jaynie Collins, DO;  Location[facility] ENDOSCOPY;  Service: Gastroenterology;  Laterality: N/A;   DE QUERVAIN'S RELEASE Left 11/24/2020   renal abscess  1994    Family History  Problem Relation Age of Onset   Heart disease Father    Asthma Father    Diverticulosis Father    Cancer Sister        Thyroid   Anxiety disorder Daughter    OCD Daughter    Hyperlipidemia Maternal Grandmother    Diverticulosis Maternal Grandmother    Diabetes Maternal Grandfather    Asthma Paternal Grandmother    Hyperlipidemia Paternal Grandmother    Seizures Son    Breast cancer Cousin 32       pat 1st     Social History   Tobacco Use   Smoking status: Never   Smokeless tobacco: Never  Substance Use Topics   Alcohol use: Yes    Alcohol/week: 0.0 standard drinks of alcohol    Comment: occasionally     Current Outpatient Medications:    Ascorbic Acid (VITAMIN C) 1000 MG tablet, Take 1,000 mg by mouth daily., Disp: , Rfl:    Cholecalciferol (VITAMIN D) 50 MCG (2000 UT) CAPS, Take 2,000 Units by mouth daily., Disp: , Rfl:    Cyanocobalamin (B-12) 1000 MCG SUBL, Place 1 tablet under the tongue daily at 12 noon., Disp: , Rfl:    cyclobenzaprine (FLEXERIL) 5 MG tablet, Take 1 tablet (5 mg total) by mouth 3 (three) times daily as needed., Disp: 30 tablet, Rfl: 0   DULoxetine (CYMBALTA) 30 MG capsule, Take 1-2 capsules (30-60 mg total) by mouth daily. Second  week take two, Disp: 60 capsule, Rfl: 0   fluticasone-salmeterol (WIXELA INHUB) 250-50 MCG/ACT AEPB, Inhale 1 puff into the lungs in the morning and at bedtime., Disp: 180 each, Rfl: 1   levalbuterol (XOPENEX HFA) 45 MCG/ACT inhaler, Inhale 2 puffs into the lungs 4 (four) times daily., Disp: 45 g, Rfl: 0   levocetirizine (XYZAL) 5 MG tablet, Take 1 tablet (5 mg total) by mouth every evening., Disp: 90 tablet, Rfl: 1   meloxicam (MOBIC) 15 MG tablet, Take 1 tablet (15 mg total) by mouth daily., Disp: 30 tablet, Rfl: 0   Pediatric Multivit-Minerals-C (GUMMI BEAR MULTIVITAMIN/MIN) CHEW, Chew  2 tablets by mouth daily., Disp: , Rfl:    phentermine (ADIPEX-P) 37.5 MG tablet, TAKE 1 TABLET BY MOUTH DAILY BEFORE BREAKFAST, Disp: 30 tablet, Rfl: 0   rosuvastatin (CRESTOR) 10 MG tablet, Take 1 tablet (10 mg total) by mouth daily., Disp: 90 tablet, Rfl: 1  Allergies  Allergen Reactions   Nitrofurantoin Anaphylaxis   Amoxicillin     rash   Gabapentin    Molds & Smuts    Montelukast Sodium     tachycardia   Nitrofurantoin Monohyd Macro Diarrhea, Itching and Nausea Only    Other reaction(s): Abdominal pain, Dizziness, Unconsciousness   Penicillin V Potassium Other (See Comments)   Pollen Extract     I personally reviewed active problem list, medication list, allergies, family history, social history, health maintenance with the patient/caregiver today.   ROS  ***  Objective  There were no vitals filed for this visit.  There is no height or weight on file to calculate BMI.  Physical Exam ***   PHQ2/9:    02/14/2023    8:08 AM 08/10/2022    8:01 AM 05/22/2022   11:40 AM 02/08/2022    9:43 AM 10/28/2021    8:55 AM  Depression screen PHQ 2/9  Decreased Interest 0 0 0 0 0  Down, Depressed, Hopeless 0 0 0 0 0  PHQ - 2 Score 0 0 0 0 0  Altered sleeping 3 0  0 0  Tired, decreased energy 0 0  0 0  Change in appetite 0 0  0 0  Feeling bad or failure about yourself  0 0  0 0  Trouble  concentrating 0 0  0 0  Moving slowly or fidgety/restless 0 0  0 0  Suicidal thoughts 0 0  0 0  PHQ-9 Score 3 0  0 0    phq 9 is {gen pos [id number]}   Fall Risk:    02/14/2023    8:08 AM 08/10/2022    8:01 AM 05/22/2022   11:39 AM 02/08/2022    9:43 AM 10/28/2021    8:55 AM  Fall Risk   Falls in the past year? 0 0 0 0 0  Number falls in past yr: 0 0 0 0 0  Injury with Fall? 0 0 0 0 0  Risk for fall due to : No Fall Risks No Fall Risks  No Fall Risks No Fall Risks  Follow up Falls prevention discussed Falls prevention discussed Falls evaluation completed Falls prevention discussed Falls prevention discussed      Functional Status Survey:      Assessment & Plan  *** There are no diagnoses linked to this encounter.

## 2023-05-02 ENCOUNTER — Ambulatory Visit: Payer: BC Managed Care – PPO | Admitting: Family Medicine

## 2023-05-09 NOTE — Progress Notes (Deleted)
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 Marcell Barlow and was advised against decompression, she also saw  Dr. Andee Poles who prescribed HCTZ 25  for the tinnitus but she stopped it also due to side effects - and did not help . Tinnitus still bad and on the left side, she is doing white noise but is frustrating since nothing that can help with symptoms Unchanged   Morbid obesity: BMI was  above 35 with dyslipidemia and DDD spine, She had tried [employer], [employer], [employer] , 21 day fix diet helped her lose 30 lbs a long time ago but when she stopped she gained it right back. We started her on Qsymia and she was tolerating medication well. Start weight was 193 lbs on 06/01/2021 and today it is down to 182 lbs but is out of medication for the past two weeks due to cost -  no longer covered by insurance.  She had more energy whitle taking medication, she still  avoiding carbs. Discussed GLP-1 agonist, she denies personal history of pancreatitis ( had to be admitted for an ulcer in the past) ,  sister had thyroid cancer but not medullary. She started Rybelsus Dec 2022 and  lost weight down to 177 lbs, however she was not able to sleep while taking it,  weight went up to 187 lbs cutting down on Calorie intake. Her son is getting married in July today weight is 182 lbs, she would like to lose weight prior to the wedding.   Asthma: she is on Advair and denies side effects,  she states no cough, wheezing , SOB , doing well on medication. She  uses rescue inhaler very seldom   Chronic back pain with intermittent flares: she takes  Flexeril and Meloxicam prn,  she stopped seeing  chiropractor in [place] but will go back when he gets his office here in [place]. Adjustments  helped with the pain.  She has constant left lower back pain but no radiculitis Stable   Dyslipidemia: taking Crestor and denies side effects, last LDL was down from 187 to 72, continue medication.  We will recheck labs today   Paresthesia: both hands but left worse than right side, noticed some numbness on both hands that is worse at night, sometimes wakes her up, not radiating to arms . No weakness. She shakes her hands to improve symptoms. It is described as pin and needles and intermittent . She also has difficulty flexing her thumbs, left worse than right side. She was seen by Ortho and is supposed to have surgery but husband will be out of town for the next two months   Vitamin D and B12 deficiency: feeling tired and we will recheck levels.   Knee pain; she has noticed aching on both knees intermittently throughout the day. She is avoiding taking meloxicam daily , she also has aching hips that is worse at night   Menopausal symptoms: she had a hysterectomy years ago but noticing worsening of hot flashes since last year, , also worried about weight gain. We tried Duloxetine but she is not sure why she stopped taking it, she would like to try it again   Patient Active Problem List   Diagnosis  Date Noted   Bilateral anterior knee pain 08/10/2022   Insulin resistance 08/10/2022   Chronic left-sided low back pain with left-sided sciatica 08/10/2022   Menopause syndrome 08/10/2022   Dyslipidemia 08/10/2022   Chiari I malformation (HCC) 08/10/2022   Strain of extensor muscle, fascia and tendon of left index finger at wrist and hand level, initial encounter 08/03/2020   Ganglion cyst 08/03/2020   Trochanteric bursitis of right hip 07/27/2020   History of COVID-19 09/13/2019   Plantar fasciitis of left foot 09/30/2018   Bulging lumbar disc 05/14/2018   Fasting hyperglycemia 04/18/2018   Vitamin D deficiency 04/18/2018   B12 deficiency 04/18/2018   Obesity (BMI 30.0-34.9) 05/10/2017    Asthma, moderate 07/30/2015   Bilateral cataracts 07/30/2015   Neuralgia neuritis, sciatic nerve 07/30/2015   Recurrent UTI 07/30/2015    Past Surgical History:  Procedure Laterality Date   ABDOMINAL HYSTERECTOMY     BLADDER SUSPENSION  2007   CESAREAN SECTION  2001   COLONOSCOPY WITH PROPOFOL N/A 08/13/2017   Procedure: COLONOSCOPY WITH PROPOFOL;  Surgeon: Wyline Mood, MD;  Location: [facility] ENDOSCOPY;  Service: Gastroenterology;  Laterality: N/A;   COLONOSCOPY WITH PROPOFOL N/A 12/21/2022   Procedure: COLONOSCOPY WITH PROPOFOL;  Surgeon: Jaynie Collins, DO;  Location: [facility] ENDOSCOPY;  Service: Gastroenterology;  Laterality: N/A;   DE QUERVAIN'S RELEASE Left 11/24/2020   renal abscess  1994    Family History  Problem Relation Age of Onset   Heart disease Father    Asthma Father    Diverticulosis Father    Cancer Sister        Thyroid   Anxiety disorder Daughter    OCD Daughter    Hyperlipidemia Maternal Grandmother    Diverticulosis Maternal Grandmother    Diabetes Maternal Grandfather    Asthma Paternal Grandmother    Hyperlipidemia Paternal Grandmother    Seizures Son    Breast cancer Cousin 32       pat 1st     Social History   Tobacco Use   Smoking status: Never   Smokeless tobacco: Never  Substance Use Topics   Alcohol use: Yes    Alcohol/week: 0.0 standard drinks of alcohol    Comment: occasionally     Current Outpatient Medications:    Ascorbic Acid (VITAMIN C) 1000 MG tablet, Take 1,000 mg by mouth daily., Disp: , Rfl:    Cholecalciferol (VITAMIN D) 50 MCG (2000 UT) CAPS, Take 2,000 Units by mouth daily., Disp: , Rfl:    Cyanocobalamin (B-12) 1000 MCG SUBL, Place 1 tablet under the tongue daily at 12 noon., Disp: , Rfl:    cyclobenzaprine (FLEXERIL) 5 MG tablet, Take 1 tablet (5 mg total) by mouth 3 (three) times daily as needed., Disp: 30 tablet, Rfl: 0   DULoxetine (CYMBALTA) 30 MG capsule, Take 1-2 capsules (30-60 mg total) by mouth daily. Second  week take two, Disp: 60 capsule, Rfl: 0   fluticasone-salmeterol (WIXELA INHUB) 250-50 MCG/ACT AEPB, Inhale 1 puff into the lungs in the morning and at bedtime., Disp: 180 each, Rfl: 1   levalbuterol (XOPENEX HFA) 45 MCG/ACT inhaler, Inhale 2 puffs into the lungs 4 (four) times daily., Disp: 45 g, Rfl: 0   levocetirizine (XYZAL) 5 MG tablet, Take 1 tablet (5 mg total) by mouth every evening., Disp: 90 tablet, Rfl: 1   meloxicam (MOBIC) 15 MG tablet, Take 1 tablet (15 mg total) by mouth daily., Disp: 30 tablet, Rfl: 0   Pediatric Multivit-Minerals-C (GUMMI BEAR MULTIVITAMIN/MIN) CHEW, Chew  2 tablets by mouth daily., Disp: , Rfl:    phentermine (ADIPEX-P) 37.5 MG tablet, TAKE 1 TABLET BY MOUTH DAILY BEFORE BREAKFAST, Disp: 30 tablet, Rfl: 0   rosuvastatin (CRESTOR) 10 MG tablet, Take 1 tablet (10 mg total) by mouth daily., Disp: 90 tablet, Rfl: 1  Allergies  Allergen Reactions   Nitrofurantoin Anaphylaxis   Amoxicillin     rash   Gabapentin    Molds & Smuts    Montelukast Sodium     tachycardia   Nitrofurantoin Monohyd Macro Diarrhea, Itching and Nausea Only    Other reaction(s): Abdominal pain, Dizziness, Unconsciousness   Penicillin V Potassium Other (See Comments)   Pollen Extract     I personally reviewed active problem list, medication list, allergies, family history, social history, health maintenance with the patient/caregiver today.   ROS  ***  Objective  There were no vitals filed for this visit.  There is no height or weight on file to calculate BMI.  Physical Exam ***  Recent Results (from the past 2160 hour(s))  Lipid panel     Status: Abnormal   Collection Time: 02/14/23  8:47 AM  Result Value Ref Range   Cholesterol 196 <200 mg/dL   HDL 73 > OR = 50 mg/dL   Triglycerides 161 <096 mg/dL   LDL Cholesterol (Calc) 103 (H) mg/dL (calc)    Comment: Reference range: <100 . Desirable range <100 mg/dL for primary prevention;   <70 mg/dL for patients with CHD or  diabetic patients  with > or = 2 CHD risk factors. Marland Kitchen LDL-C is now calculated using the Martin-Hopkins  calculation, which is a validated novel method providing  better accuracy than the Friedewald equation in the  estimation of LDL-C.  Horald Pollen et al. Lenox Ahr. 0454;098(11): 2061-2068  (http://education.[facility].com/faq/FAQ164)    Total CHOL/HDL Ratio 2.7 <5.0 (calc)   Non-HDL Cholesterol (Calc) 123 <130 mg/dL (calc)    Comment: For patients with diabetes plus 1 major ASCVD risk  factor, treating to a non-HDL-C goal of <100 mg/dL  (LDL-C of <91 mg/dL) is considered a therapeutic  option.   COMPLETE METABOLIC PANEL WITH GFR     Status: Abnormal   Collection Time: 02/14/23  8:47 AM  Result Value Ref Range   Glucose, Bld 100 (H) 65 - 99 mg/dL    Comment: .            Fasting reference interval . For someone without known diabetes, a glucose value between 100 and 125 mg/dL is consistent with prediabetes and should be confirmed with a follow-up test. .    BUN 19 7 - 25 mg/dL   Creat 4.78 2.95 - 6.21 mg/dL   eGFR 96 > OR = 60 HY/QMV/7.84O9   BUN/Creatinine Ratio SEE NOTE: 6 - 22 (calc)    Comment:    Not Reported: BUN and Creatinine are within    reference range. .    Sodium 142 135 - 146 mmol/L   Potassium 5.0 3.5 - 5.3 mmol/L   Chloride 103 98 - 110 mmol/L   CO2 31 20 - 32 mmol/L   Calcium 10.1 8.6 - 10.4 mg/dL   Total Protein 7.1 6.1 - 8.1 g/dL   Albumin 4.5 3.6 - 5.1 g/dL   Globulin 2.6 1.9 - 3.7 g/dL (calc)   AG Ratio 1.7 1.0 - 2.5 (calc)   Total Bilirubin 0.7 0.2 - 1.2 mg/dL   Alkaline phosphatase (APISO) 75 37 - 153 U/L   AST 16 10 - 35  U/L   ALT 18 6 - 29 U/L  CBC with Differential/Platelet     Status: None   Collection Time: 02/14/23  8:47 AM  Result Value Ref Range   WBC 5.6 3.8 - 10.8 Thousand/uL   RBC 4.51 3.80 - 5.10 Million/uL   Hemoglobin 14.1 11.7 - 15.5 g/dL   HCT 16.1 09.6 - 04.5 %   MCV 92.2 80.0 - 100.0 fL   MCH 31.3 27.0 - 33.0 pg   MCHC  33.9 32.0 - 36.0 g/dL   RDW 40.9 81.1 - 91.4 %   Platelets 272 140 - 400 Thousand/uL   MPV 10.2 7.5 - 12.5 fL   Neutro Abs 3,410 1,500 - 7,800 cells/uL   Lymphs Abs 1,596 850 - 3,900 cells/uL   Absolute Monocytes 454 200 - 950 cells/uL   Eosinophils Absolute 112 15 - 500 cells/uL   Basophils Absolute 28 0 - 200 cells/uL   Neutrophils Relative % 60.9 %   Total Lymphocyte 28.5 %   Monocytes Relative 8.1 %   Eosinophils Relative 2.0 %   Basophils Relative 0.5 %  Hemoglobin A1c     Status: Abnormal   Collection Time: 02/14/23  8:47 AM  Result Value Ref Range   Hgb A1c MFr Bld 5.9 (H) <5.7 % of total Hgb    Comment: For someone without known diabetes, a hemoglobin  A1c value between 5.7% and 6.4% is consistent with prediabetes and should be confirmed with a  follow-up test. . For someone with known diabetes, a value <7% indicates that their diabetes is well controlled. A1c targets should be individualized based on duration of diabetes, age, comorbid conditions, and other considerations. . This assay result is consistent with an increased risk of diabetes. . Currently, no consensus exists regarding use of hemoglobin A1c for diagnosis of diabetes for children. .    Mean Plasma Glucose 123 mg/dL   eAG (mmol/L) 6.8 mmol/L    Comment: . This test was performed on the Roche cobas c503 platform. Effective 08/14/22, a change in test platforms from the Abbott Architect to the Roche cobas c503 may have shifted HbA1c results compared to historical results. Based on laboratory validation testing conducted at [facility], the Roche platform relative to the Abbott platform had an average increase in HbA1c value of < or = 0.3%. This difference is within accepted  variability established by the [facility]. Note that not all individuals will have had a shift in their results and direct comparisons between historical and current results for testing conducted  on different platforms is not recommended.   Vitamin B12     Status: None   Collection Time: 02/14/23  8:47 AM  Result Value Ref Range   Vitamin B-12 478 200 - 1,100 pg/mL  VITAMIN D 25 Hydroxy (Vit-D Deficiency, Fractures)     Status: Abnormal   Collection Time: 02/14/23  8:47 AM  Result Value Ref Range   Vit D, 25-Hydroxy 25 (L) 30 - 100 ng/mL    Comment: Vitamin D Status         25-OH Vitamin D: . Deficiency:                    <20 ng/mL Insufficiency:             20 - 29 ng/mL Optimal:                 > or = 30 ng/mL . For 25-OH Vitamin D testing on  patients on  D2-supplementation and patients for whom quantitation  of D2 and D3 fractions is required, the [facility]ure[facility](TM) 25-OH VIT D, (D2,D3), LC/MS/MS is recommended: order  code 30865 (patients >46yrs). . See Note 1 . Note 1 . For additional information, please refer to  http://education.[facility].com/faq/FAQ199  (This link is being provided for informational/ educational purposes only.)     PHQ2/9:    02/14/2023    8:08 AM 08/10/2022    8:01 AM 05/22/2022   11:40 AM 02/08/2022    9:43 AM 10/28/2021    8:55 AM  Depression screen PHQ 2/9  Decreased Interest 0 0 0 0 0  Down, Depressed, Hopeless 0 0 0 0 0  PHQ - 2 Score 0 0 0 0 0  Altered sleeping 3 0  0 0  Tired, decreased energy 0 0  0 0  Change in appetite 0 0  0 0  Feeling bad or failure about yourself  0 0  0 0  Trouble concentrating 0 0  0 0  Moving slowly or fidgety/restless 0 0  0 0  Suicidal thoughts 0 0  0 0  PHQ-9 Score 3 0  0 0    phq 9 is {gen pos [id number]}   Fall Risk:    02/14/2023    8:08 AM 08/10/2022    8:01 AM 05/22/2022   11:39 AM 02/08/2022    9:43 AM 10/28/2021    8:55 AM  Fall Risk   Falls in the past year? 0 0 0 0 0  Number falls in past yr: 0 0 0 0 0  Injury with Fall? 0 0 0 0 0  Risk for fall due to : No Fall Risks No Fall Risks  No Fall Risks No Fall Risks  Follow up Falls prevention discussed Falls prevention discussed  Falls evaluation completed Falls prevention discussed Falls prevention discussed      Functional Status Survey:      Assessment & Plan  *** There are no diagnoses linked to this encounter.

## 2023-05-14 ENCOUNTER — Ambulatory Visit: Payer: BC Managed Care – PPO | Admitting: Family Medicine

## 2023-05-31 NOTE — Progress Notes (Signed)
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 Marcell Barlow and was advised against decompression, she also saw  Dr. Andee Poles who prescribed HCTZ 25  for the tinnitus but she stopped it also due to side effects - and did not help . Tinnitus still bad and on the left side, she is doing white noise but is frustrating since nothing that can help with symptoms I will give her Meclizine to take prn   Morbid obesity: BMI was  above 35 with dyslipidemia and DDD spine, She had tried [employer], [employer], [employer] , 21 day fix diet helped her lose 30 lbs a long time ago but when she stopped she gained it right back. We started her on Qsymia and she was tolerating medication well. Start weight was 193 lbs on 06/01/2021 and today it is down to 182 lbs but insurance stopped covering the medication .  She had more energy whitle taking medication, she still  avoiding carbs. Discussed GLP-1 agonist, she denies personal history of pancreatitis ( had to be admitted for an ulcer in the past) ,  sister had thyroid cancer but not medullary. She started Rybelsus Dec 2022 and  lost weight down to 177 lbs, however she was not able to sleep while taking it.  Her son got married in July and we gave her Adipex for one month , she went down from 182 lbs to 170 lbs in one month, weight is back again at 180 lbs. She is still eating heathy . She has been doing yoga, and has a walking pad under her desk.   Asthma: she is on Advair and denies side effects,  she states no cough, wheezing , SOB . She states doing well lately and not using medication   Tinnitus/Meniere's disease left : getting worse on left side, seen by ENT, had MRI and unknown cause of tinnitus. She states recently she had vertigo that  lasted a few seconds, she felt like she was spinning . She states father and paternal grandmother have vertigo also. She states ENT told him nothing they can do for her, we will add meclizine prn   Chronic back pain with intermittent flares: she takes  Flexeril and Meloxicam prn,  she stopped seeing  chiropractor in [place] but will go back when he gets his office here in [place]. Adjustments helped with the pain.  She has constant left lower back pain but no radiculitis Stable   Dyslipidemia: taking Crestor and denies side effects, last LDL was down from 187 to 72 and last visit it was 103   Vitamin D and B12 deficiency: levels were at goal on last labs   Knee pain; she has noticed aching on both knees intermittently throughout the day. She is avoiding taking meloxicam daily , she also has aching hips that is worse at night , not taking medication, she is doing yoga and seems to help with joint problems   Menopausal symptoms: she had a hysterectomy years ago but noticing worsening of hot flashes since last year, , also worried about weight gain. We tried Duloxetine and it helped but out of medication, we will send 60 mg dose to take daily   Patient Active Problem List  Diagnosis Date Noted   Bilateral anterior knee pain 08/10/2022   Insulin resistance 08/10/2022   Chronic left-sided low back pain with left-sided sciatica 08/10/2022   Menopause syndrome 08/10/2022   Dyslipidemia 08/10/2022   Chiari I malformation (HCC) 08/10/2022   Strain of extensor muscle, fascia and tendon of left index finger at wrist and hand level, initial encounter 08/03/2020   Ganglion cyst 08/03/2020   Trochanteric bursitis of right hip 07/27/2020   History of COVID-19 09/13/2019   Plantar fasciitis of left foot 09/30/2018   Bulging lumbar disc 05/14/2018   Fasting hyperglycemia 04/18/2018   Vitamin D deficiency 04/18/2018   B12 deficiency 04/18/2018   Obesity (BMI 30.0-34.9) 05/10/2017   Asthma,  moderate 07/30/2015   Bilateral cataracts 07/30/2015   Neuralgia neuritis, sciatic nerve 07/30/2015   Recurrent UTI 07/30/2015    Past Surgical History:  Procedure Laterality Date   ABDOMINAL HYSTERECTOMY     BLADDER SUSPENSION  2007   CESAREAN SECTION  2001   COLONOSCOPY WITH PROPOFOL N/A 08/13/2017   Procedure: COLONOSCOPY WITH PROPOFOL;  Surgeon: Wyline Mood, MD;  Location: [facility] ENDOSCOPY;  Service: Gastroenterology;  Laterality: N/A;   COLONOSCOPY WITH PROPOFOL N/A 12/21/2022   Procedure: COLONOSCOPY WITH PROPOFOL;  Surgeon: Jaynie Collins, DO;  Location: [facility] ENDOSCOPY;  Service: Gastroenterology;  Laterality: N/A;   DE QUERVAIN'S RELEASE Left 11/24/2020   renal abscess  1994    Family History  Problem Relation Age of Onset   Heart disease Father    Asthma Father    Diverticulosis Father    Cancer Sister        Thyroid   Anxiety disorder Daughter    OCD Daughter    Hyperlipidemia Maternal Grandmother    Diverticulosis Maternal Grandmother    Diabetes Maternal Grandfather    Asthma Paternal Grandmother    Hyperlipidemia Paternal Grandmother    Seizures Son    Breast cancer Cousin 32       pat 1st     Social History   Tobacco Use   Smoking status: Never   Smokeless tobacco: Never  Substance Use Topics   Alcohol use: Yes    Alcohol/week: 0.0 standard drinks of alcohol    Comment: occasionally     Current Outpatient Medications:    levocetirizine (XYZAL) 5 MG tablet, Take 1 tablet (5 mg total) by mouth every evening., Disp: 90 tablet, Rfl: 1   meloxicam (MOBIC) 15 MG tablet, Take 1 tablet (15 mg total) by mouth daily., Disp: 30 tablet, Rfl: 0   rosuvastatin (CRESTOR) 10 MG tablet, Take 1 tablet (10 mg total) by mouth daily., Disp: 90 tablet, Rfl: 1   Ascorbic Acid (VITAMIN C) 1000 MG tablet, Take 1,000 mg by mouth daily. (Patient not taking: Reported on 06/01/2023), Disp: , Rfl:    Cholecalciferol (VITAMIN D) 50 MCG (2000 UT) CAPS, Take 2,000 Units by mouth  daily. (Patient not taking: Reported on 06/01/2023), Disp: , Rfl:    Cyanocobalamin (B-12) 1000 MCG SUBL, Place 1 tablet under the tongue daily at 12 noon. (Patient not taking: Reported on 06/01/2023), Disp: , Rfl:    cyclobenzaprine (FLEXERIL) 5 MG tablet, Take 1 tablet (5 mg total) by mouth 3 (three) times daily as needed. (Patient not taking: Reported on 06/01/2023), Disp: 30 tablet, Rfl: 0   DULoxetine (CYMBALTA) 30 MG capsule, Take 1-2 capsules (30-60 mg total) by mouth daily. Second week take two (Patient not taking: Reported on 06/01/2023), Disp: 60 capsule, Rfl: 0   fluticasone-salmeterol (WIXELA INHUB)  250-50 MCG/ACT AEPB, Inhale 1 puff into the lungs in the morning and at bedtime. (Patient not taking: Reported on 06/01/2023), Disp: 180 each, Rfl: 1   levalbuterol (XOPENEX HFA) 45 MCG/ACT inhaler, Inhale 2 puffs into the lungs 4 (four) times daily. (Patient not taking: Reported on 06/01/2023), Disp: 45 g, Rfl: 0   Pediatric Multivit-Minerals-C (GUMMI BEAR MULTIVITAMIN/MIN) CHEW, Chew 2 tablets by mouth daily. (Patient not taking: Reported on 06/01/2023), Disp: , Rfl:    phentermine (ADIPEX-P) 37.5 MG tablet, TAKE 1 TABLET BY MOUTH DAILY BEFORE BREAKFAST (Patient not taking: Reported on 06/01/2023), Disp: 30 tablet, Rfl: 0  Allergies  Allergen Reactions   Nitrofurantoin Anaphylaxis   Amoxicillin     rash   Gabapentin    Molds & Smuts    Montelukast Sodium     tachycardia   Nitrofurantoin Monohyd Macro Diarrhea, Itching and Nausea Only    Other reaction(s): Abdominal pain, Dizziness, Unconsciousness   Penicillin V Potassium Other (See Comments)   Pollen Extract     I personally reviewed active problem list, medication list, allergies, family history, social history, health maintenance with the patient/caregiver today.   ROS  Ten systems reviewed and is negative except as mentioned in HPI    Objective  Vitals:   06/01/23 0916  BP: 112/76  Pulse: 88  Resp: 14  Temp: 98.4 F (36.9  C)  TempSrc: Oral  SpO2: 99%  Weight: 180 lb 6.4 oz (81.8 kg)  Height: 5\' 3"  (1.6 m)    Body mass index is 31.96 kg/m.  Physical Exam  Constitutional: Patient appears well-developed and well-nourished. Obese  No distress.  HEENT: head atraumatic, normocephalic, pupils equal and reactive to light, ears normal TM, neck supple Cardiovascular: Normal rate, regular rhythm and normal heart sounds.  No murmur heard. No BLE edema. Pulmonary/Chest: Effort normal and breath sounds normal. No respiratory distress. Abdominal: Soft.  There is no tenderness. Psychiatric: Patient has a normal mood and affect. behavior is normal. Judgment and thought content normal.   PHQ2/9:    06/01/2023    9:20 AM 02/14/2023    8:08 AM 08/10/2022    8:01 AM 05/22/2022   11:40 AM 02/08/2022    9:43 AM  Depression screen PHQ 2/9  Decreased Interest 0 0 0 0 0  Down, Depressed, Hopeless 0 0 0 0 0  PHQ - 2 Score 0 0 0 0 0  Altered sleeping 3 3 0  0  Tired, decreased energy 0 0 0  0  Change in appetite 0 0 0  0  Feeling bad or failure about yourself  0 0 0  0  Trouble concentrating 0 0 0  0  Moving slowly or fidgety/restless 0 0 0  0  Suicidal thoughts 0 0 0  0  PHQ-9 Score 3 3 0  0  Difficult doing work/chores Not difficult at all        phq 9 is negative   Fall Risk:    06/01/2023    9:20 AM 02/14/2023    8:08 AM 08/10/2022    8:01 AM 05/22/2022   11:39 AM 02/08/2022    9:43 AM  Fall Risk   Falls in the past year? 0 0 0 0 0  Number falls in past yr:  0 0 0 0  Injury with Fall?  0 0 0 0  Risk for fall due to : No Fall Risks No Fall Risks No Fall Risks  No Fall Risks  Follow up Falls prevention discussed  Falls prevention discussed Falls prevention discussed Falls evaluation completed Falls prevention discussed      Functional Status Survey: Is the patient deaf or have difficulty hearing?: No Does the patient have difficulty seeing, even when wearing glasses/contacts?: No Does the patient have  difficulty concentrating, remembering, or making decisions?: No Does the patient have difficulty walking or climbing stairs?: No Does the patient have difficulty dressing or bathing?: No Does the patient have difficulty doing errands alone such as visiting a doctor's office or shopping?: No    Assessment & Plan  1. Menopause syndrome  - DULoxetine (CYMBALTA) 60 MG capsule; Take 1 capsule (60 mg total) by mouth daily. Second week take two  Dispense: 90 capsule; Refill: 1  2. Chiari I malformation (HCC)  Stable   3. Dyslipidemia  Continue Crestor  4. Moderate persistent asthma without complication  Taking it prn   5. Meniere disease, left  - meclizine (ANTIVERT) 12.5 MG tablet; Take 1 tablet (12.5 mg total) by mouth 3 (three) times daily as needed for dizziness.  Dispense: 30 tablet; Refill: 0  6. Obesity (BMI 30.0-34.9)  - Naltrexone-buPROPion HCl ER (CONTRAVE) 8-90 MG TB12; Take 2 tablets by mouth 2 (two) times daily. Start 1 tablet every morning for 7 days, then 1 tablet twice daily for 7 days, then 2 tablets every morning and one every evening  Dispense: 120 tablet; Refill: 2

## 2023-06-01 ENCOUNTER — Encounter: Payer: Self-pay | Admitting: Family Medicine

## 2023-06-01 ENCOUNTER — Ambulatory Visit: Payer: BC Managed Care – PPO | Admitting: Family Medicine

## 2023-06-01 VITALS — BP 112/76 | HR 88 | Temp 98.4°F | Resp 14 | Ht 63.0 in | Wt 180.4 lb

## 2023-06-01 DIAGNOSIS — N951 Menopausal and female climacteric states: Secondary | ICD-10-CM

## 2023-06-01 DIAGNOSIS — J4540 Moderate persistent asthma, uncomplicated: Secondary | ICD-10-CM | POA: Diagnosis not present

## 2023-06-01 DIAGNOSIS — G935 Compression of brain: Secondary | ICD-10-CM | POA: Diagnosis not present

## 2023-06-01 DIAGNOSIS — E785 Hyperlipidemia, unspecified: Secondary | ICD-10-CM | POA: Diagnosis not present

## 2023-06-01 DIAGNOSIS — E669 Obesity, unspecified: Secondary | ICD-10-CM

## 2023-06-01 DIAGNOSIS — H8102 Meniere's disease, left ear: Secondary | ICD-10-CM

## 2023-06-01 MED ORDER — MECLIZINE HCL 12.5 MG PO TABS: 12.5000 mg | ORAL_TABLET | Freq: Three times a day (TID) | ORAL | 0 refills | Status: AC | PRN

## 2023-06-01 MED ORDER — CONTRAVE 8-90 MG PO TB12: 2.0000 | ORAL_TABLET | Freq: Two times a day (BID) | ORAL | 2 refills | Status: DC

## 2023-06-01 MED ORDER — DULOXETINE HCL 60 MG PO CPEP: 60.0000 mg | ORAL_CAPSULE | Freq: Every day | ORAL | 1 refills | Status: DC

## 2023-06-01 NOTE — Patient Instructions (Signed)
Magnesium glycate

## 2023-06-06 ENCOUNTER — Ambulatory Visit: Payer: BC Managed Care – PPO | Admitting: Family Medicine

## 2023-06-08 ENCOUNTER — Ambulatory Visit: Payer: BC Managed Care – PPO | Admitting: Family Medicine

## 2023-06-11 ENCOUNTER — Other Ambulatory Visit: Payer: Self-pay | Admitting: Family Medicine

## 2023-06-11 DIAGNOSIS — J3089 Other allergic rhinitis: Secondary | ICD-10-CM

## 2023-07-06 ENCOUNTER — Ambulatory Visit: Payer: Self-pay | Admitting: *Deleted

## 2023-07-06 ENCOUNTER — Other Ambulatory Visit: Payer: Self-pay | Admitting: Family Medicine

## 2023-07-06 DIAGNOSIS — R300 Dysuria: Secondary | ICD-10-CM

## 2023-07-06 MED ORDER — CIPROFLOXACIN HCL 250 MG PO TABS: 250.0000 mg | ORAL_TABLET | Freq: Two times a day (BID) | ORAL | 0 refills | Status: AC

## 2023-07-06 NOTE — Telephone Encounter (Signed)
Message from [place] P sent at 07/06/2023 10:13 AM EDT  Summary: uti   Pt called saying she hurst when she urinates.  She is flying out tomorrow .  She needs something sent in.  She said Dr. Carlynn Purl knows her history  CB#  [phone]          Call History  Contact Date/Time Type Contact Phone/Fax User  07/06/2023 10:12 AM EDT Phone ([address]) Cathy Gray, Cathy Gray (Self) [phone] Judie Petit) Daphine Deutscher D   Reason for Disposition  Age > 50 years  Answer Assessment - Initial Assessment Questions 1. SEVERITY: "How bad is the pain?"  (e.g., Scale 1-10; mild, moderate, or severe)   - MILD (1-3): complains slightly about urination hurting   - MODERATE (4-7): interferes with normal activities     - SEVERE (8-10): excruciating, unwilling or unable to urinate because of the pain      Burning with urination and having pressure.   It's my usual UTI symptoms.    I get these a lot.   I'm flying out tomorrow and was wondering if Dr. Carlynn Purl would call me in something.     I'm allergic to Macrobid.  The burning started yesterday morning.   I having a little lower back pain but I always have that. 2. FREQUENCY: "How many times have you had painful urination today?"      Every time I urinate 3. PATTERN: "Is pain present every time you urinate or just sometimes?"      Pressure 4. ONSET: "When did the painful urination start?"      Yesterday morning 5. FEVER: "Do you have a fever?" If Yes, ask: "What is your temperature, how was it measured, and when did it start?"     No 6. PAST UTI: "Have you had a urine infection before?" If Yes, ask: "When was the last time?" and "What happened that time?"      Yes many times.   Dr. Carlynn Purl is aware of this. 7. CAUSE: "What do you think is causing the painful urination?"  (e.g., UTI, scratch, Herpes sore)     UTI 8. OTHER SYMPTOMS: "Do you have any other symptoms?" (e.g., blood in urine, flank pain, genital sores, urgency, vaginal discharge)     Just burning and  pressure with mild lower back pain which I always have. 9. PREGNANCY: "Is there any chance you are pregnant?" "When was your last menstrual period?"     Not asked due to age  Protocols used: Urination Pain - [facility]

## 2023-07-06 NOTE — Telephone Encounter (Signed)
  Chief Complaint: Pt flying out tomorrow.   She gets frequent UTIs.   She wants to know if Dr. Carlynn Purl would be willing to call in an antibiotic for the UTI.  "I get these all the time and she knows my history".   Symptoms: Yesterday burning with urination started.   Today also having pressure over bladder area. Frequency: Since Yesterday morning. Pertinent Negatives: Patient denies Denies blood in urine or fever. Disposition: [] ED /[] Urgent Care (no appt availability in office) / [] Appointment(In office/virtual)/ []  [facility]/ [] Home Care/ [] Refused Recommended Disposition /[] [facility]/ [x]  Follow-up with PCP Additional Notes: Message sent to Dr. Carlynn Purl with pt's request for an antibiotic to be called in since she is flying out tomorrow.   Call to [facility] on [address] if Dr. Carlynn Purl willing to call it in.

## 2023-09-05 NOTE — Progress Notes (Unsigned)
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 Marcell Barlow and was advised against decompression, she also saw  Dr. Andee Poles who prescribed HCTZ 25  for the tinnitus but she stopped it also due to side effects - and did not help . Tinnitus still bad and on the left side, she is doing white noise but is frustrating since nothing that can help with symptoms I gave her Meclizine to take prn   Morbid obesity: BMI was  above 35 with dyslipidemia and DDD spine, She had tried [employer], [employer], [employer] , 21 day fix diet helped her lose 30 lbs a long time ago but when she stopped she gained it right back. We started her on Qsymia and she was tolerating medication well. Start weight was 193 lbs on 06/01/2021 and today it is down to 182 lbs but insurance stopped covering the medication .  She had more energy whitle taking medication, she still  avoiding carbs. Discussed GLP-1 agonist, she denies personal history of pancreatitis ( had to be admitted for an ulcer in the past) ,  sister had thyroid cancer but not medullary. She started Rybelsus Dec 2022 and  lost weight down to 177 lbs, however she was not able to sleep while taking it.  Her son got married in July and we gave her Adipex the month before the wedding - her weight went down from 182 lbs to 170 lbs in one month, even though she has been conscious about her diet and eating clean, still using her walking pad but weight is up to 187 lbs today   Asthma: she is on Advair and denies side effects,  she states no cough, wheezing , SOB . She uses medication prn   Tinnitus/Meniere's disease left : getting worse on left side, seen by ENT, had MRI and unknown cause of tinnitus. She states recently she had vertigo that lasted a few  seconds, she felt like she was spinning . She states father and paternal grandmother have vertigo also. She states ENT told him nothing they can do for her, she takes meclizine prn   Chronic back pain with intermittent flares: she takes  Flexeril and Meloxicam prn,  she stopped seeing  chiropractor in [place] but will go back when he gets his office here in [place]. Adjustments helped with the pain.  She has constant left lower back pain but no radiculitis She states her muscles are also feeling sore lately    Dyslipidemia: taking Crestor and denies side effects, last LDL was down from 187 to 72 and last visit it was 103 , she states compliant with medication.   Vitamin D and B12 deficiency: vitamin D was low last time, needs to continue both supplements   Knee pain; she has noticed aching on both knees intermittently throughout the day. She is avoiding taking meloxicam daily , she also has aching hips that is worse at night , not taking medication, she is doing yoga and seems to help with joint problems   Menopausal symptoms: she had a hysterectomy years ago but noticing worsening of hot flashes since last year, also worried about weight gain. She took Cymbalta this Summer but stopped because it caused pain  Patient Active Problem List   Diagnosis Date Noted   Meniere disease, left 06/01/2023   Bilateral anterior knee pain 08/10/2022   Insulin resistance 08/10/2022   Chronic left-sided low back pain with left-sided sciatica 08/10/2022   Menopause syndrome 08/10/2022   Dyslipidemia 08/10/2022   Chiari I malformation (HCC) 08/10/2022   Strain of extensor muscle, fascia and tendon of left index finger at wrist and hand level, initial encounter 08/03/2020   Ganglion cyst 08/03/2020   Trochanteric bursitis of right hip 07/27/2020   History of COVID-19 09/13/2019   Plantar fasciitis of left foot 09/30/2018   Bulging lumbar disc 05/14/2018   Fasting hyperglycemia 04/18/2018   Vitamin D  deficiency 04/18/2018   B12 deficiency 04/18/2018   Obesity (BMI 30.0-34.9) 05/10/2017   Asthma, moderate 07/30/2015   Bilateral cataracts 07/30/2015   Neuralgia neuritis, sciatic nerve 07/30/2015   Recurrent UTI 07/30/2015    Past Surgical History:  Procedure Laterality Date   ABDOMINAL HYSTERECTOMY     BLADDER SUSPENSION  2007   CESAREAN SECTION  2001   COLONOSCOPY WITH PROPOFOL N/A 08/13/2017   Procedure: COLONOSCOPY WITH PROPOFOL;  Surgeon: Wyline Mood, MD;  Location: [facility] ENDOSCOPY;  Service: Gastroenterology;  Laterality: N/A;   COLONOSCOPY WITH PROPOFOL N/A 12/21/2022   Procedure: COLONOSCOPY WITH PROPOFOL;  Surgeon: Jaynie Collins, DO;  Location: [facility] ENDOSCOPY;  Service: Gastroenterology;  Laterality: N/A;   DE QUERVAIN'S RELEASE Left 11/24/2020   renal abscess  1994    Family History  Problem Relation Age of Onset   Heart disease Father    Asthma Father    Diverticulosis Father    Cancer Sister        Thyroid   Anxiety disorder Daughter    OCD Daughter    Hyperlipidemia Maternal Grandmother    Diverticulosis Maternal Grandmother    Diabetes Maternal Grandfather    Asthma Paternal Grandmother    Hyperlipidemia Paternal Grandmother    Seizures Son    Breast cancer Cousin 32       pat 1st     Social History   Tobacco Use   Smoking status: Never   Smokeless tobacco: Never  Substance Use Topics   Alcohol use: Yes    Alcohol/week: 0.0 standard drinks of alcohol    Comment: occasionally     Current Outpatient Medications:    cyclobenzaprine (FLEXERIL) 5 MG tablet, Take 1 tablet (5 mg total) by mouth 3 (three) times daily as needed., Disp: 30 tablet, Rfl: 0   levocetirizine (XYZAL) 5 MG tablet, TAKE 1 TABLET BY MOUTH EVERY DAY IN THE EVENING, Disp: 90 tablet, Rfl: 1   meloxicam (MOBIC) 15 MG tablet, Take 1 tablet (15 mg total) by mouth daily., Disp: 30 tablet, Rfl: 0   rosuvastatin (CRESTOR) 10 MG tablet, Take 1 tablet (10 mg total) by mouth daily.,  Disp: 90 tablet, Rfl: 1   Cholecalciferol (VITAMIN D) 50 MCG (2000 UT) CAPS, Take 2,000 Units by mouth daily., Disp: , Rfl:    Cyanocobalamin (B-12) 1000 MCG SUBL, Place 1 tablet under the tongue daily at 12 noon., Disp: , Rfl:    DULoxetine (CYMBALTA) 60 MG capsule, Take 1 capsule (60 mg total) by mouth daily. Second week take two, Disp: 90 capsule, Rfl: 1   fluticasone-salmeterol (WIXELA INHUB) 250-50 MCG/ACT AEPB, Inhale 1 puff into the lungs in the morning and at bedtime., Disp: 180 each, Rfl: 1   levalbuterol (XOPENEX HFA) 45 MCG/ACT inhaler, Inhale 2 puffs into the lungs 4 (four) times  daily., Disp: 45 g, Rfl: 0   meclizine (ANTIVERT) 12.5 MG tablet, Take 1 tablet (12.5 mg total) by mouth 3 (three) times daily as needed for dizziness., Disp: 30 tablet, Rfl: 0   Naltrexone-buPROPion HCl ER (CONTRAVE) 8-90 MG TB12, Take 2 tablets by mouth 2 (two) times daily. Start 1 tablet every morning for 7 days, then 1 tablet twice daily for 7 days, then 2 tablets every morning and one every evening, Disp: 120 tablet, Rfl: 2   Pediatric Multivit-Minerals-C (GUMMI BEAR MULTIVITAMIN/MIN) CHEW, Chew 2 tablets by mouth daily., Disp: , Rfl:   Allergies  Allergen Reactions   Nitrofurantoin Anaphylaxis   Amoxicillin     rash   Gabapentin    Molds & Smuts    Montelukast Sodium     tachycardia   Nitrofurantoin Monohyd Macro Diarrhea, Itching and Nausea Only    Other reaction(s): Abdominal pain, Dizziness, Unconsciousness   Penicillin V Potassium Other (See Comments)   Pollen Extract     I personally reviewed active problem list, medication list, allergies, family history, social history, health maintenance with the patient/caregiver today.   ROS  Constitutional: Negative for fever , positive  weight change.  Respiratory: Negative for cough and shortness of breath.   Cardiovascular: Negative for chest pain or palpitations.  Gastrointestinal: Negative for abdominal pain, no bowel changes.   Musculoskeletal: Negative for gait problem or joint swelling.  Skin: Negative for rash.  Neurological: Negative for dizziness or headache.  No other specific complaints in a complete review of systems (except as listed in HPI above).   Objective  Vitals:   09/06/23 0814  BP: 120/74  Pulse: 85  Resp: 16  Temp: 98.1 F (36.7 C)  TempSrc: Oral  SpO2: 98%  Weight: 187 lb 8 oz (85 kg)  Height: 5\' 2"  (1.575 m)    Body mass index is 34.29 kg/m.  Physical Exam  Constitutional: Patient appears well-developed and well-nourished. Obese  No distress.  HEENT: head atraumatic, normocephalic, pupils equal and reactive to light, neck supple Cardiovascular: Normal rate, regular rhythm and normal heart sounds.  No murmur heard. No BLE edema. Pulmonary/Chest: Effort normal and breath sounds normal. No respiratory distress. Abdominal: Soft.  There is no tenderness. Psychiatric: Patient has a normal mood and affect. behavior is normal. Judgment and thought content normal.   PHQ2/9:    09/06/2023    8:17 AM 06/01/2023    9:20 AM 02/14/2023    8:08 AM 08/10/2022    8:01 AM 05/22/2022   11:40 AM  Depression screen PHQ 2/9  Decreased Interest 0 0 0 0 0  Down, Depressed, Hopeless 0 0 0 0 0  PHQ - 2 Score 0 0 0 0 0  Altered sleeping 3 3 3  0   Tired, decreased energy 0 0 0 0   Change in appetite 0 0 0 0   Feeling bad or failure about yourself  0 0 0 0   Trouble concentrating 0 0 0 0   Moving slowly or fidgety/restless 0 0 0 0   Suicidal thoughts 0 0 0 0   PHQ-9 Score 3 3 3  0   Difficult doing work/chores Not difficult at all Not difficult at all       phq 9 is negative   Fall Risk:    09/06/2023    8:17 AM 06/01/2023    9:20 AM 02/14/2023    8:08 AM 08/10/2022    8:01 AM 05/22/2022   11:39 AM  Fall Risk  Falls in the past year? 0 0 0 0 0  Number falls in past yr:   0 0 0  Injury with Fall?   0 0 0  Risk for fall due to : No Fall Risks No Fall Risks No Fall Risks No Fall Risks    Follow up Falls prevention discussed Falls prevention discussed Falls prevention discussed Falls prevention discussed Falls evaluation completed      Functional Status Survey: Is the patient deaf or have difficulty hearing?: No Does the patient have difficulty seeing, even when wearing glasses/contacts?: No Does the patient have difficulty concentrating, remembering, or making decisions?: No Does the patient have difficulty walking or climbing stairs?: No Does the patient have difficulty dressing or bathing?: No Does the patient have difficulty doing errands alone such as visiting a doctor's office or shopping?: No    Assessment & Plan  1. Chiari I malformation (HCC)  Stable   2. Need for immunization against influenza  - Flu vaccine trivalent PF, 6mos and older(Flulaval,Afluria,Fluarix,Fluzone)  3. Other fatigue  - CBC with Differential/Platelet - COMPLETE METABOLIC PANEL WITH GFR - TSH  4. Generalized body aches  - CBC with Differential/Platelet - COMPLETE METABOLIC PANEL WITH GFR - C-reactive protein - Sedimentation rate  5. B12 deficiency  - B12 and Folate Panel  6. Vitamin D deficiency  - VITAMIN D 25 Hydroxy (Vit-D Deficiency, Fractures)  7. Fasting hyperglycemia  - Hemoglobin A1c  8. Dyslipidemia  Taking crestor  9. Chronic left-sided low back pain with left-sided sciatica  - celecoxib (CELEBREX) 100 MG capsule; Take 1 capsule (100 mg total) by mouth 2 (two) times daily.  Dispense: 180 capsule; Refill: 0

## 2023-09-06 ENCOUNTER — Ambulatory Visit: Payer: BC Managed Care – PPO | Admitting: Family Medicine

## 2023-09-06 ENCOUNTER — Encounter: Payer: Self-pay | Admitting: Family Medicine

## 2023-09-06 VITALS — BP 120/74 | HR 85 | Temp 98.1°F | Resp 16 | Ht 62.0 in | Wt 187.5 lb

## 2023-09-06 DIAGNOSIS — G935 Compression of brain: Secondary | ICD-10-CM

## 2023-09-06 DIAGNOSIS — E785 Hyperlipidemia, unspecified: Secondary | ICD-10-CM

## 2023-09-06 DIAGNOSIS — E538 Deficiency of other specified B group vitamins: Secondary | ICD-10-CM | POA: Diagnosis not present

## 2023-09-06 DIAGNOSIS — R52 Pain, unspecified: Secondary | ICD-10-CM

## 2023-09-06 DIAGNOSIS — M5442 Lumbago with sciatica, left side: Secondary | ICD-10-CM

## 2023-09-06 DIAGNOSIS — E559 Vitamin D deficiency, unspecified: Secondary | ICD-10-CM

## 2023-09-06 DIAGNOSIS — R7301 Impaired fasting glucose: Secondary | ICD-10-CM

## 2023-09-06 DIAGNOSIS — Z23 Encounter for immunization: Secondary | ICD-10-CM | POA: Diagnosis not present

## 2023-09-06 DIAGNOSIS — R5383 Other fatigue: Secondary | ICD-10-CM | POA: Diagnosis not present

## 2023-09-06 DIAGNOSIS — G8929 Other chronic pain: Secondary | ICD-10-CM

## 2023-09-06 MED ORDER — CELECOXIB 100 MG PO CAPS: 100.0000 mg | ORAL_CAPSULE | Freq: Two times a day (BID) | ORAL | 0 refills | Status: DC

## 2023-09-06 NOTE — Patient Instructions (Signed)
Magnesium glycinate

## 2023-09-07 ENCOUNTER — Other Ambulatory Visit: Payer: Self-pay | Admitting: Family Medicine

## 2023-09-07 DIAGNOSIS — E559 Vitamin D deficiency, unspecified: Secondary | ICD-10-CM

## 2023-09-07 LAB — COMPLETE METABOLIC PANEL WITH GFR
AG Ratio: 2 (calc) (ref 1.0–2.5)
ALT: 25 U/L (ref 6–29)
AST: 21 U/L (ref 10–35)
Albumin: 4.8 g/dL (ref 3.6–5.1)
Alkaline phosphatase (APISO): 77 U/L (ref 37–153)
BUN: 17 mg/dL (ref 7–25)
CO2: 30 mmol/L (ref 20–32)
Calcium: 10.3 mg/dL (ref 8.6–10.4)
Chloride: 101 mmol/L (ref 98–110)
Creat: 0.85 mg/dL (ref 0.50–1.03)
Globulin: 2.4 g/dL (ref 1.9–3.7)
Glucose, Bld: 97 mg/dL (ref 65–99)
Potassium: 4.7 mmol/L (ref 3.5–5.3)
Sodium: 140 mmol/L (ref 135–146)
Total Bilirubin: 0.7 mg/dL (ref 0.2–1.2)
Total Protein: 7.2 g/dL (ref 6.1–8.1)
eGFR: 80 mL/min/{1.73_m2} (ref >=60)

## 2023-09-07 LAB — SEDIMENTATION RATE: Sed Rate: 14 mm/h (ref 0–30)

## 2023-09-07 LAB — CBC WITH DIFFERENTIAL/PLATELET
Absolute Lymphocytes: 1804 {cells}/uL (ref 850–3900)
Absolute Monocytes: 378 {cells}/uL (ref 200–950)
Basophils Absolute: 37 {cells}/uL (ref 0–200)
Basophils Relative: 0.6 %
Eosinophils Absolute: 62 {cells}/uL (ref 15–500)
Eosinophils Relative: 1 %
HCT: 42.4 % (ref 35.0–45.0)
Hemoglobin: 13.8 g/dL (ref 11.7–15.5)
MCH: 30.8 pg (ref 27.0–33.0)
MCHC: 32.5 g/dL (ref 32.0–36.0)
MCV: 94.6 fL (ref 80.0–100.0)
MPV: 9.8 fL (ref 7.5–12.5)
Monocytes Relative: 6.1 %
Neutro Abs: 3918 {cells}/uL (ref 1500–7800)
Neutrophils Relative %: 63.2 %
Platelets: 284 10*3/uL (ref 140–400)
RBC: 4.48 10*6/uL (ref 3.80–5.10)
RDW: 12.1 % (ref 11.0–15.0)
Total Lymphocyte: 29.1 %
WBC: 6.2 10*3/uL (ref 3.8–10.8)

## 2023-09-07 LAB — B12 AND FOLATE PANEL
Folate: 22.3 ng/mL
Vitamin B-12: 535 pg/mL (ref 200–1100)

## 2023-09-07 LAB — C-REACTIVE PROTEIN: CRP: <3 mg/L (ref <8.0)

## 2023-09-07 LAB — HEMOGLOBIN A1C
Hgb A1c MFr Bld: 5.9 %{Hb} — ABNORMAL HIGH (ref <5.7)
Mean Plasma Glucose: 123 mg/dL
eAG (mmol/L): 6.8 mmol/L

## 2023-09-07 LAB — TSH: TSH: 1.44 m[IU]/L (ref 0.40–4.50)

## 2023-09-07 LAB — VITAMIN D 25 HYDROXY (VIT D DEFICIENCY, FRACTURES): Vit D, 25-Hydroxy: 21 ng/mL — ABNORMAL LOW (ref 30–100)

## 2023-09-07 MED ORDER — VITAMIN D (ERGOCALCIFEROL) 1.25 MG (50000 UNIT) PO CAPS: 50000.0000 [IU] | ORAL_CAPSULE | ORAL | 0 refills | Status: DC

## 2023-09-18 ENCOUNTER — Ambulatory Visit: Payer: BC Managed Care – PPO | Admitting: Podiatry

## 2023-09-28 ENCOUNTER — Encounter: Payer: Self-pay | Admitting: Nurse Practitioner

## 2023-09-28 ENCOUNTER — Other Ambulatory Visit: Payer: Self-pay

## 2023-09-28 ENCOUNTER — Ambulatory Visit: Payer: BC Managed Care – PPO | Admitting: Nurse Practitioner

## 2023-09-28 VITALS — BP 120/82 | HR 98 | Temp 98.1°F | Resp 18 | Ht 63.0 in | Wt 190.9 lb

## 2023-09-28 DIAGNOSIS — J029 Acute pharyngitis, unspecified: Secondary | ICD-10-CM | POA: Diagnosis not present

## 2023-09-28 LAB — POCT RAPID STREP A (OFFICE): Rapid Strep A Screen: NEGATIVE

## 2023-09-28 MED ORDER — AZITHROMYCIN 250 MG PO TABS: ORAL_TABLET | ORAL | 0 refills | Status: AC

## 2023-09-28 NOTE — Progress Notes (Signed)
BP 120/82   Pulse 98   Temp 98.1 F (36.7 C) (Oral)   Resp 18   Ht 5\' 3"  (1.6 m)   Wt 190 lb 14.4 oz (86.6 kg)   SpO2 99%   BMI 33.82 kg/m    Subjective:    Patient ID: Cathy Gray, female    DOB: October 02, 1966, 57 y.o.   MRN: [id number]  HPI: Cathy Gray is a 57 y.o. female  Chief Complaint  Patient presents with   Sore Throat    Headache for 3 days   Discussed the use of AI scribe software for clinical note transcription with the patient, who gave verbal consent to proceed.  History of Present Illness   The patient presents with a 3-day history of a sore throat and headache. The sore throat is described as centrally located and is associated with difficulty swallowing. They deny fevers and sinus congestion. A cough was noted to have started on the day of the visit. The patient has not taken any medications for these symptoms. Despite the discomfort, they are still able to eat and drink. The patient works in an [employer] as a Diplomatic Services operational officer.    Relevant past medical, surgical, family and social history reviewed and updated as indicated. Interim medical history since our last visit reviewed. Allergies and medications reviewed and updated.  Review of Systems  Ten systems reviewed and is negative except as mentioned in HPI       Objective:    BP 120/82   Pulse 98   Temp 98.1 F (36.7 C) (Oral)   Resp 18   Ht 5\' 3"  (1.6 m)   Wt 190 lb 14.4 oz (86.6 kg)   SpO2 99%   BMI 33.82 kg/m   Wt Readings from Last 3 Encounters:  09/28/23 190 lb 14.4 oz (86.6 kg)  09/06/23 187 lb 8 oz (85 kg)  06/01/23 180 lb 6.4 oz (81.8 kg)    Physical Exam  Constitutional: Patient appears well-developed and well-nourished. Obese  No distress.  HEENT: head atraumatic, normocephalic, pupils equal and reactive to light, ears TMs clear, neck supple, throat erythematous, mild lymphadenopathy  Cardiovascular: Normal rate, regular rhythm and normal heart sounds.  No murmur heard. No BLE  edema. Pulmonary/Chest: Effort normal and breath sounds normal. No respiratory distress. Abdominal: Soft.  There is no tenderness. Psychiatric: Patient has a normal mood and affect. behavior is normal. Judgment and thought content normal.  Results for orders placed or performed in visit on 09/28/23  POCT rapid strep A  Result Value Ref Range   Rapid Strep A Screen Negative Negative      Assessment & Plan:   Problem List Items Addressed This Visit   None Visit Diagnoses     Sore throat    -  Primary   Relevant Medications   azithromycin (ZITHROMAX) 250 MG tablet   Other Relevant Orders   POCT rapid strep A (Completed)   Pharyngitis, unspecified etiology       Relevant Medications   azithromycin (ZITHROMAX) 250 MG tablet   Other Relevant Orders   POCT rapid strep A (Completed)       Assessment and Plan    Pharyngitis Sore throat and headache for 3 days. No fever or sinus congestion. Mild cough. Throat appears erythematous on examination. Rapid strep test negative, but clinical picture suggestive of streptococcal pharyngitis. -Start Azithromycin (Z-Pak) due to patient's known tolerance. -Advise gargling with salt water, drinking tea with honey, and maintaining hydration. -Request patient  to send a message on 10/01/2023 to update on symptoms.         Follow up plan: Return if symptoms worsen or fail to improve.

## 2023-10-25 ENCOUNTER — Ambulatory Visit: Payer: BC Managed Care – PPO

## 2023-10-25 ENCOUNTER — Encounter: Payer: Self-pay | Admitting: Podiatry

## 2023-10-25 ENCOUNTER — Ambulatory Visit: Payer: BC Managed Care – PPO | Admitting: Podiatry

## 2023-10-25 VITALS — Ht 63.0 in | Wt 190.9 lb

## 2023-10-25 DIAGNOSIS — M7751 Other enthesopathy of right foot: Secondary | ICD-10-CM | POA: Diagnosis not present

## 2023-10-25 DIAGNOSIS — M216X1 Other acquired deformities of right foot: Secondary | ICD-10-CM | POA: Diagnosis not present

## 2023-10-25 DIAGNOSIS — M79671 Pain in right foot: Secondary | ICD-10-CM | POA: Diagnosis not present

## 2023-10-25 NOTE — Progress Notes (Signed)
Subjective:  Patient ID: Cathy Gray, female    DOB: 1966-03-21,  MRN: [id number]  Chief Complaint  Patient presents with   Foot Pain    Pt is here due to right foot pain, pt states the top of her foot hurts and the bottom is swollen, thinks it could be gout but not sure, pain and swelling has been there for over a month no injury to foot.    57 y.o. female presents with the above complaint.  Patient presents for right fifth MTP capsulitis pain on palpation.  Patient states that has been hurting for quite some time she wanted discuss treatment options for it.  Hurts with ambulation worse with pressure she would like to discuss steroid injection.  She has not seen and was prior to seeing me   Review of Systems: Negative except as noted in the HPI. Denies N/V/F/Ch.  Past Medical History:  Diagnosis Date   Allergy    Asthma    Bilateral cataracts    Breast mass, left 12/07/2015   Chronic sciatica of left side    Colicky right upper quadrant pain    Fever blister    Hay fever    Over weight     Current Outpatient Medications:    celecoxib (CELEBREX) 100 MG capsule, Take 1 capsule (100 mg total) by mouth 2 (two) times daily., Disp: 180 capsule, Rfl: 0   Cholecalciferol (VITAMIN D) 50 MCG (2000 UT) CAPS, Take 2,000 Units by mouth daily., Disp: , Rfl:    Cyanocobalamin (B-12) 1000 MCG SUBL, Place 1 tablet under the tongue daily at 12 noon., Disp: , Rfl:    cyclobenzaprine (FLEXERIL) 5 MG tablet, Take 1 tablet (5 mg total) by mouth 3 (three) times daily as needed., Disp: 30 tablet, Rfl: 0   fluticasone-salmeterol (WIXELA INHUB) 250-50 MCG/ACT AEPB, Inhale 1 puff into the lungs in the morning and at bedtime., Disp: 180 each, Rfl: 1   levalbuterol (XOPENEX HFA) 45 MCG/ACT inhaler, Inhale 2 puffs into the lungs 4 (four) times daily., Disp: 45 g, Rfl: 0   levocetirizine (XYZAL) 5 MG tablet, TAKE 1 TABLET BY MOUTH EVERY DAY IN THE EVENING, Disp: 90 tablet, Rfl: 1   meclizine (ANTIVERT) 12.5  MG tablet, Take 1 tablet (12.5 mg total) by mouth 3 (three) times daily as needed for dizziness., Disp: 30 tablet, Rfl: 0   rosuvastatin (CRESTOR) 10 MG tablet, Take 1 tablet (10 mg total) by mouth daily., Disp: 90 tablet, Rfl: 1   Vitamin D, Ergocalciferol, (DRISDOL) 1.25 MG (50000 UNIT) CAPS capsule, Take 1 capsule (50,000 Units total) by mouth every 7 (seven) days., Disp: 12 capsule, Rfl: 0  Social History   Tobacco Use  Smoking Status Never  Smokeless Tobacco Never    Allergies  Allergen Reactions   Nitrofurantoin Anaphylaxis   Amoxicillin     rash   Gabapentin    Molds & Smuts    Montelukast Sodium     tachycardia   Nitrofurantoin Monohyd Macro Diarrhea, Itching and Nausea Only    Other reaction(s): Abdominal pain, Dizziness, Unconsciousness   Penicillin V Potassium Other (See Comments)   Pollen Extract    Objective:  There were no vitals filed for this visit. Body mass index is 33.82 kg/m. Constitutional Well developed. Well nourished.  Vascular Dorsalis pedis pulses palpable bilaterally. Posterior tibial pulses palpable bilaterally. Capillary refill normal to all digits.  No cyanosis or clubbing noted. Pedal hair growth normal.  Neurologic Normal speech. Oriented to person, place, and  time. Epicritic sensation to light touch grossly present bilaterally.  Dermatologic Nails well groomed and normal in appearance. No open wounds. No skin lesions.  Orthopedic: Right fifth MTP capsulitis pain on palpation to the fifth MPJ joint mild pain with range of motion of the joint.  Plantarflexed fifth metatarsal noted   Radiographs: None Assessment:   1. Capsulitis of metatarsophalangeal (MTP) joint of right foot   2. Plantar flexed metatarsal, right    Plan:  Patient was evaluated and treated and all questions answered.  Right fifth MTP capsulitis with with underlying plantarflexed fifth metatarsal -All questions and concerns were discussed with the patient in  extensive detail given the amount of pain that she is having she will benefit from steroid injection to help decrease acute inflammatory component/foot pain.  Patient agrees with plan like to proceed with steroid injection. -A steroid injection was performed at right fifth MTP using 1% plain Lidocaine and 10 mg of Kenalog. This was well tolerated. -Shoe gear modification was discussed   No follow-ups on file.

## 2023-11-22 ENCOUNTER — Encounter: Payer: Self-pay | Admitting: Family Medicine

## 2023-12-06 ENCOUNTER — Ambulatory Visit: Payer: BC Managed Care – PPO | Admitting: Family Medicine

## 2023-12-07 ENCOUNTER — Other Ambulatory Visit: Payer: Self-pay | Admitting: Family Medicine

## 2023-12-07 DIAGNOSIS — E785 Hyperlipidemia, unspecified: Secondary | ICD-10-CM

## 2023-12-08 ENCOUNTER — Other Ambulatory Visit: Payer: Self-pay | Admitting: Family Medicine

## 2023-12-08 DIAGNOSIS — G8929 Other chronic pain: Secondary | ICD-10-CM

## 2023-12-10 NOTE — Telephone Encounter (Signed)
Spoke with pt and informed that prescription has been sent to pharmacy, however dr Carlynn Purl has asked that pt schedule follow up in April. Pt verbalized understanding and will call back to schedule.

## 2023-12-10 NOTE — Telephone Encounter (Signed)
Last f/u 09/06/23

## 2024-03-06 ENCOUNTER — Other Ambulatory Visit: Payer: Self-pay | Admitting: Obstetrics and Gynecology

## 2024-03-06 DIAGNOSIS — Z1231 Encounter for screening mammogram for malignant neoplasm of breast: Secondary | ICD-10-CM

## 2024-04-22 ENCOUNTER — Ambulatory Visit
Admission: RE | Admit: 2024-04-22 | Discharge: 2024-04-22 | Disposition: A | Discharge status: Home / Self Care | Payer: Self-pay | Source: Ambulatory Visit | Attending: Obstetrics and Gynecology | Admitting: Obstetrics and Gynecology

## 2024-04-22 DIAGNOSIS — Z1231 Encounter for screening mammogram for malignant neoplasm of breast: Secondary | ICD-10-CM | POA: Insufficient documentation

## 2024-07-09 ENCOUNTER — Other Ambulatory Visit: Payer: Self-pay | Admitting: Family Medicine

## 2024-07-09 DIAGNOSIS — E785 Hyperlipidemia, unspecified: Secondary | ICD-10-CM

## 2024-08-07 NOTE — Telephone Encounter (Unsigned)
 Copied from CRM #[id number]. Topic: Clinical - Medication Refill >> Aug 07, 2024  4:31 PM Teressa P wrote: Medication: Rosuvastatin  10 mg  Has the patient contacted their pharmacy? Yes (Agent: If no, request that the patient contact the pharmacy for the refill. If patient does not wish to contact the pharmacy document the reason why and proceed with request.) (Agent: If yes, when and what did the pharmacy advise?)  This is the patient's preferred pharmacy:  [id number] [address] [facility] - [address] [place] [address] Phone: [phone] Fax: [phone] Is this the correct pharmacy for this prescription? Yes If no, delete pharmacy and type the correct one.   Has the prescription been filled recently? Yes  Is the patient out of the medication? Yes  Has the patient been seen for an appointment in the last year OR does the patient have an upcoming appointment? Yes  Can we respond through MyChart? Yes  Agent: Please be advised that Rx refills may take up to 3 business days. We ask that you follow-up with your pharmacy.

## 2024-08-14 ENCOUNTER — Telehealth: Payer: Self-pay | Admitting: Pharmacy Technician

## 2024-08-14 ENCOUNTER — Encounter: Payer: Self-pay | Admitting: Family Medicine

## 2024-08-14 ENCOUNTER — Other Ambulatory Visit (HOSPITAL_COMMUNITY): Payer: Self-pay

## 2024-08-14 ENCOUNTER — Ambulatory Visit: Admitting: Family Medicine

## 2024-08-14 VITALS — BP 122/72 | HR 84 | Resp 16 | Ht 63.0 in | Wt 195.9 lb

## 2024-08-14 DIAGNOSIS — E538 Deficiency of other specified B group vitamins: Secondary | ICD-10-CM

## 2024-08-14 DIAGNOSIS — E66811 Obesity, class 1: Secondary | ICD-10-CM

## 2024-08-14 DIAGNOSIS — J4540 Moderate persistent asthma, uncomplicated: Secondary | ICD-10-CM | POA: Diagnosis not present

## 2024-08-14 DIAGNOSIS — Z23 Encounter for immunization: Secondary | ICD-10-CM

## 2024-08-14 DIAGNOSIS — G935 Compression of brain: Secondary | ICD-10-CM

## 2024-08-14 DIAGNOSIS — Z79899 Other long term (current) drug therapy: Secondary | ICD-10-CM

## 2024-08-14 DIAGNOSIS — E559 Vitamin D deficiency, unspecified: Secondary | ICD-10-CM | POA: Diagnosis not present

## 2024-08-14 DIAGNOSIS — E785 Hyperlipidemia, unspecified: Secondary | ICD-10-CM | POA: Diagnosis not present

## 2024-08-14 DIAGNOSIS — Z1159 Encounter for screening for other viral diseases: Secondary | ICD-10-CM

## 2024-08-14 DIAGNOSIS — N951 Menopausal and female climacteric states: Secondary | ICD-10-CM

## 2024-08-14 DIAGNOSIS — J3089 Other allergic rhinitis: Secondary | ICD-10-CM

## 2024-08-14 DIAGNOSIS — R7301 Impaired fasting glucose: Secondary | ICD-10-CM

## 2024-08-14 MED ORDER — ROSUVASTATIN CALCIUM 10 MG PO TABS: 10.0000 mg | ORAL_TABLET | Freq: Every day | ORAL | 1 refills | Status: DC

## 2024-08-14 MED ORDER — FLUTICASONE-SALMETEROL 250-50 MCG/ACT IN AEPB: 1.0000 | INHALATION_SPRAY | Freq: Two times a day (BID) | RESPIRATORY_TRACT | 1 refills | Status: DC

## 2024-08-14 MED ORDER — PHENTERMINE-TOPIRAMATE ER 3.75-23 MG PO CP24: 1.0000 | ORAL_CAPSULE | ORAL | 0 refills | Status: DC

## 2024-08-14 MED ORDER — MAGNESIUM GLYCINATE 100 MG PO CAPS: 1.0000 | ORAL_CAPSULE | Freq: Every evening | ORAL | 0 refills | Status: AC | PRN

## 2024-08-14 MED ORDER — LEVOCETIRIZINE DIHYDROCHLORIDE 5 MG PO TABS: 5.0000 mg | ORAL_TABLET | Freq: Every evening | ORAL | 1 refills | Status: DC

## 2024-08-14 MED ORDER — VITAMIN D (ERGOCALCIFEROL) 1.25 MG (50000 UNIT) PO CAPS: 50000.0000 [IU] | ORAL_CAPSULE | ORAL | 0 refills | Status: DC

## 2024-08-14 MED ORDER — PHENTERMINE-TOPIRAMATE ER 7.5-46 MG PO CP24: 1.0000 | ORAL_CAPSULE | ORAL | 1 refills | Status: AC

## 2024-08-14 NOTE — Telephone Encounter (Signed)
 Pharmacy Patient Advocate Encounter   Received notification from CoverMyMeds that prior authorization for Phentermine -Topiramate ER 3.75-23MG  er capsules is required/requested.   Insurance verification completed.   The patient is insured through [facility].   Per test claim: PA required; PA submitted to above mentioned insurance via Latent Key/confirmation #/[id number] [facility] Status is pending

## 2024-08-14 NOTE — Progress Notes (Signed)
 Name: Cathy Gray   MRN: [id number]    DOB: Aug 16, 1966   Date:08/14/2024       Progress Note  Subjective  Chief Complaint  Chief Complaint  Patient presents with   Medical Management of Chronic Issues   Discussed the use of AI scribe software for clinical note transcription with the patient, who gave verbal consent to proceed.  History of Present Illness Cathy Gray is a 58 year old female who presents for a regular follow-up and medication refills.  She has been out of her cholesterol medication, rosuvastatin , for a week. She takes it daily when available and has had no issues with it. Her LDL cholesterol levels improved from 187 to the 70s but recently rose to 103. Despite tracking her diet and exercising daily, her weight increased from 187 lbs a year ago to 195 lbs currently.  She experiences difficulty with weight management despite consuming 1335 calories daily and exercising every morning at 4:30 AM. She has tried natural remedies like ginger and turmeric shots, lemon water, and moringa to manage cortisol levels and improve sleep but continues to struggle with weight gain and sleep issues. She has not been sleeping well, partly due to her partner's snoring, and has tried alcohol to aid sleep, which has been ineffective.  She has a history of mild persistent asthma, managed with daily Advair , and has not needed her rescue inhaler recently. She also has menopausal symptoms, including hot flashes and sleep disturbances, and uses Estrace vaginal cream.  Her past medical history includes B12 deficiency, for which she has received shots, and low vitamin D  levels, for which she has not been taking supplements recently. She also has a history of Chiari 1 malformation, with occasional headaches managed with Tylenol, and tinnitus, which persists without recent episodes of dizziness. She has not been using Celebrex  or Flexeril  for back pain recently.  She takes Xyzal  for allergies and has not been  taking over-the-counter vitamin D  supplements. Her last physical was over a year ago, and she has not had recent blood work. She is due for a comprehensive panel, including lipid profile, anemia, diabetes, B12, folate, and vitamin D  levels.    Patient Active Problem List   Diagnosis Date Noted   Meniere disease, left 06/01/2023   Bilateral anterior knee pain 08/10/2022   Insulin  resistance 08/10/2022   Chronic left-sided low back pain with left-sided sciatica 08/10/2022   Menopause syndrome 08/10/2022   Dyslipidemia 08/10/2022   Chiari I malformation (HCC) 08/10/2022   Strain of extensor muscle, fascia and tendon of left index finger at wrist and hand level, initial encounter 08/03/2020   Ganglion cyst 08/03/2020   Trochanteric bursitis of right hip 07/27/2020   History of COVID-19 09/13/2019   Plantar fasciitis of left foot 09/30/2018   Bulging lumbar disc 05/14/2018   Fasting hyperglycemia 04/18/2018   Vitamin D  deficiency 04/18/2018   B12 deficiency 04/18/2018   Obesity (BMI 30.0-34.9) 05/10/2017   Asthma, moderate 07/30/2015   Bilateral cataracts 07/30/2015   Neuralgia neuritis, sciatic nerve 07/30/2015   Recurrent UTI 07/30/2015    Past Surgical History:  Procedure Laterality Date   ABDOMINAL HYSTERECTOMY     BLADDER SUSPENSION  2007   CESAREAN SECTION  2001   COLONOSCOPY WITH PROPOFOL  N/A 08/13/2017   Procedure: COLONOSCOPY WITH PROPOFOL ;  Surgeon: Therisa Bi, MD;  Location: [facility] ENDOSCOPY;  Service: Gastroenterology;  Laterality: N/A;   COLONOSCOPY WITH PROPOFOL  N/A 12/21/2022   Procedure: COLONOSCOPY WITH PROPOFOL ;  Surgeon: Onita Elspeth Sharper,  DO;  Location: [facility] ENDOSCOPY;  Service: Gastroenterology;  Laterality: N/A;   DE QUERVAIN'S RELEASE Left 11/24/2020   renal abscess  1994    Family History  Problem Relation Age of Onset   Heart disease Father    Asthma Father    Diverticulosis Father    Cancer Sister        Thyroid    Anxiety disorder Daughter    OCD  Daughter    Hyperlipidemia Maternal Grandmother    Diverticulosis Maternal Grandmother    Diabetes Maternal Grandfather    Asthma Paternal Grandmother    Hyperlipidemia Paternal Grandmother    Seizures Son    Breast cancer Cousin 32       pat 1st     Social History   Tobacco Use   Smoking status: Never   Smokeless tobacco: Never  Substance Use Topics   Alcohol use: Yes    Alcohol/week: 0.0 standard drinks of alcohol    Comment: occasionally     Current Outpatient Medications:    celecoxib  (CELEBREX ) 100 MG capsule, TAKE 1 CAPSULE BY MOUTH TWICE A DAY (Patient taking differently: Take 100 mg by mouth as needed for moderate pain (pain score 4-6).), Disp: 180 capsule, Rfl: 0   cyclobenzaprine  (FLEXERIL ) 5 MG tablet, Take 1 tablet (5 mg total) by mouth 3 (three) times daily as needed., Disp: 30 tablet, Rfl: 0   estradiol (ESTRACE) 0.1 MG/GM vaginal cream, Place 1 Applicatorful vaginally daily., Disp: , Rfl:    fluticasone -salmeterol (WIXELA INHUB ) 250-50 MCG/ACT AEPB, Inhale 1 puff into the lungs in the morning and at bedtime., Disp: 180 each, Rfl: 1   levalbuterol  (XOPENEX  HFA) 45 MCG/ACT inhaler, Inhale 2 puffs into the lungs 4 (four) times daily., Disp: 45 g, Rfl: 0   levocetirizine (XYZAL ) 5 MG tablet, TAKE 1 TABLET BY MOUTH EVERY DAY IN THE EVENING, Disp: 90 tablet, Rfl: 1   rosuvastatin  (CRESTOR ) 10 MG tablet, TAKE 1 TABLET BY MOUTH EVERY DAY, Disp: 90 tablet, Rfl: 1   Cholecalciferol (VITAMIN D ) 50 MCG (2000 UT) CAPS, Take 2,000 Units by mouth daily. (Patient not taking: Reported on 08/14/2024), Disp: , Rfl:    Cyanocobalamin  (B-12) 1000 MCG SUBL, Place 1 tablet under the tongue daily at 12 noon. (Patient not taking: Reported on 08/14/2024), Disp: , Rfl:    meclizine  (ANTIVERT ) 12.5 MG tablet, Take 1 tablet (12.5 mg total) by mouth 3 (three) times daily as needed for dizziness. (Patient not taking: Reported on 08/14/2024), Disp: 30 tablet, Rfl: 0   Vitamin D , Ergocalciferol ,  (DRISDOL ) 1.25 MG (50000 UNIT) CAPS capsule, Take 1 capsule (50,000 Units total) by mouth every 7 (seven) days. (Patient not taking: Reported on 08/14/2024), Disp: 12 capsule, Rfl: 0  Allergies  Allergen Reactions   Nitrofurantoin Anaphylaxis   Amoxicillin     rash   Gabapentin    Molds & Smuts    Montelukast Sodium     tachycardia   Nitrofurantoin Monohyd Macro Diarrhea, Itching and Nausea Only    Other reaction(s): Abdominal pain, Dizziness, Unconsciousness   Penicillin V Potassium Other (See Comments)   Pollen Extract     I personally reviewed active problem list, medication list, allergies with the patient/caregiver today.   ROS  Ten systems reviewed and is negative except as mentioned in HPI    Objective Physical Exam  CONSTITUTIONAL: Patient appears well-developed and well-nourished.  No distress. HEENT: Head atraumatic, normocephalic, neck supple. CARDIOVASCULAR: Normal rate, regular rhythm and normal heart sounds.  No murmur heard.  No BLE edema. PULMONARY: Effort normal and breath sounds normal. No respiratory distress. ABDOMINAL: There is no tenderness or distention. MUSCULOSKELETAL: Normal gait. Without gross motor or sensory deficit. PSYCHIATRIC: Patient has a normal mood and affect. behavior is normal. Judgment and thought content normal.  Vitals:   08/14/24 0920  BP: 122/72  Pulse: 84  Resp: 16  SpO2: 99%  Weight: 195 lb 14.4 oz (88.9 kg)  Height: 5' 3 (1.6 m)    Body mass index is 34.7 kg/m.    PHQ2/9:    08/14/2024    9:12 AM 09/28/2023   12:06 PM 09/06/2023    8:17 AM 06/01/2023    9:20 AM 02/14/2023    8:08 AM  Depression screen PHQ 2/9  Decreased Interest 0 0 0 0 0  Down, Depressed, Hopeless 0 0 0 0 0  PHQ - 2 Score 0 0 0 0 0  Altered sleeping   3 3 3   Tired, decreased energy   0 0 0  Change in appetite   0 0 0  Feeling bad or failure about yourself    0 0 0  Trouble concentrating   0 0 0  Moving slowly or fidgety/restless   0 0 0   Suicidal thoughts   0 0 0  PHQ-9 Score   3 3 3   Difficult doing work/chores   Not difficult at all Not difficult at all     phq 9 is negative  Fall Risk:    08/14/2024    9:12 AM 09/28/2023   12:06 PM 09/06/2023    8:17 AM 06/01/2023    9:20 AM 02/14/2023    8:08 AM  Fall Risk   Falls in the past year? 0 0 0 0 0  Number falls in past yr: 0 0   0  Injury with Fall? 0    0  Risk for fall due to : No Fall Risks No Fall Risks No Fall Risks No Fall Risks No Fall Risks  Follow up Falls evaluation completed Falls prevention discussed Falls prevention discussed Falls prevention discussed Falls prevention discussed      Assessment & Plan  Chiari type 1 malformation - no recent problems with vertigo, has tinnitus that is chronic and stable  Obesity Obesity with difficulty in weight loss despite regular physical activity and calorie monitoring. Insurance covers Qsymia . - Prescribe Qsymia  starting dose 3.75/23 mg for one month, then increase to maintenance dose if tolerated. - Schedule follow-up in three months to monitor weight loss and medication response.  Dyslipidemia Dyslipidemia with improved LDL levels from 187 mg/dL to 896 mg/dL. Currently out of rosuvastatin  for one week. - Prescribe rosuvastatin  10 mg. - Order fasting lipid panel. - Reassess lipid levels in six months.  Moderate persistent asthma Moderate persistent asthma well-controlled with Advair . - Prescribe Advair .  Allergic rhinitis Allergic rhinitis managed with Xyzal . - Prescribe Xyzal .  Menopausal syndrome Menopausal syndrome contributing to sleep disturbances. Discussed potential use of hormone replacement therapy.  Insomnia Difficulty falling asleep, possibly related to menopausal syndrome. - Recommend magnesium glycinate for sleep.  Vitamin D  deficiency Vitamin D  deficiency not currently managed with prescription vitamin D . - Prescribe vitamin D  for three months. - Switch to over-the-counter vitamin  D after prescription course.  Vitamin B12 deficiency Vitamin B12 deficiency with previous adequate levels, no current supplementation. - Order B12 level.   General Health Maintenance Regular follow-up and health maintenance discussed. - Order comprehensive metabolic panel, CBC, lipid panel, anemia panel, diabetes screening. -  Order vitamin D , B12, and folate levels.

## 2024-08-14 NOTE — Telephone Encounter (Signed)
 Pharmacy Patient Advocate Encounter  Received notification from [facility] that Prior Authorization for Phentermine -Topiramate ER 3.75-23MG  er capsules has been APPROVED from 08/14/24 to 11/14/24. Ran test claim, Copay is $5.00. This test claim was processed through [facility]- copay amounts may vary at other pharmacies due to pharmacy/plan contracts, or as the patient moves through the different stages of their insurance plan.   PA #/Case ID/Reference #: [id number]

## 2024-08-15 ENCOUNTER — Ambulatory Visit: Payer: Self-pay | Admitting: Family Medicine

## 2024-08-15 LAB — CBC WITH DIFFERENTIAL/PLATELET
Absolute Lymphocytes: 1771 {cells}/uL (ref 850–3900)
Absolute Monocytes: 383 {cells}/uL (ref 200–950)
Basophils Absolute: 38 {cells}/uL (ref 0–200)
Basophils Relative: 0.7 %
Eosinophils Absolute: 81 {cells}/uL (ref 15–500)
Eosinophils Relative: 1.5 %
HCT: 39.9 % (ref 35.0–45.0)
Hemoglobin: 12.9 g/dL (ref 11.7–15.5)
MCH: 31.4 pg (ref 27.0–33.0)
MCHC: 32.3 g/dL (ref 32.0–36.0)
MCV: 97.1 fL (ref 80.0–100.0)
MPV: 10.4 fL (ref 7.5–12.5)
Monocytes Relative: 7.1 %
Neutro Abs: 3127 {cells}/uL (ref 1500–7800)
Neutrophils Relative %: 57.9 %
Platelets: 266 Thousand/uL (ref 140–400)
RBC: 4.11 Million/uL (ref 3.80–5.10)
RDW: 11.8 % (ref 11.0–15.0)
Total Lymphocyte: 32.8 %
WBC: 5.4 Thousand/uL (ref 3.8–10.8)

## 2024-08-15 LAB — LIPID PANEL
Cholesterol: 204 mg/dL — ABNORMAL HIGH (ref <200)
HDL: 66 mg/dL (ref >=50)
LDL Cholesterol (Calc): 118 mg/dL — ABNORMAL HIGH
Non-HDL Cholesterol (Calc): 138 mg/dL — ABNORMAL HIGH (ref <130)
Total CHOL/HDL Ratio: 3.1 (calc) (ref <5.0)
Triglycerides: 94 mg/dL (ref <150)

## 2024-08-15 LAB — COMPREHENSIVE METABOLIC PANEL WITH GFR
AG Ratio: 1.8 (calc) (ref 1.0–2.5)
ALT: 19 U/L (ref 6–29)
AST: 16 U/L (ref 10–35)
Albumin: 4.3 g/dL (ref 3.6–5.1)
Alkaline phosphatase (APISO): 59 U/L (ref 37–153)
BUN: 20 mg/dL (ref 7–25)
CO2: 30 mmol/L (ref 20–32)
Calcium: 9.6 mg/dL (ref 8.6–10.4)
Chloride: 103 mmol/L (ref 98–110)
Creat: 0.82 mg/dL (ref 0.50–1.03)
Globulin: 2.4 g/dL (ref 1.9–3.7)
Glucose, Bld: 92 mg/dL (ref 65–99)
Potassium: 4.8 mmol/L (ref 3.5–5.3)
Sodium: 141 mmol/L (ref 135–146)
Total Bilirubin: 0.6 mg/dL (ref 0.2–1.2)
Total Protein: 6.7 g/dL (ref 6.1–8.1)
eGFR: 83 mL/min/1.73m2 (ref >=60)

## 2024-08-15 LAB — HEMOGLOBIN A1C
Hgb A1c MFr Bld: 5.7 % — ABNORMAL HIGH (ref <5.7)
Mean Plasma Glucose: 117 mg/dL
eAG (mmol/L): 6.5 mmol/L

## 2024-08-15 LAB — B12 AND FOLATE PANEL
Folate: 14.5 ng/mL
Vitamin B-12: 387 pg/mL (ref 200–1100)

## 2024-08-15 LAB — VITAMIN D 25 HYDROXY (VIT D DEFICIENCY, FRACTURES): Vit D, 25-Hydroxy: 24 ng/mL — ABNORMAL LOW (ref 30–100)

## 2024-08-15 LAB — HEPATITIS B SURFACE ANTIBODY,QUALITATIVE: Hep B S Ab: NONREACTIVE

## 2024-08-25 ENCOUNTER — Telehealth: Payer: Self-pay

## 2024-08-25 ENCOUNTER — Other Ambulatory Visit: Payer: Self-pay | Admitting: Family Medicine

## 2024-08-25 NOTE — Telephone Encounter (Signed)
 Copied from CRM [id number]. Topic: Clinical - Prescription Issue >> Aug 14, 2024  2:50 PM Turkey B wrote: Reason for CRM: Patient called in states Dr  Glenard was to prescribe her weight loss med that starts with a Q, patient doesn't know the full name of it and I dont show this in the system and patient states it wasn't at the pharmacy. Im showing phentermine  >> Aug 25, 2024  8:48 AM Amy B wrote: Patient states she was going to receive a prescription for a GLP-1.  Pharmacy states they have not received a prescription.  She does not remember the name of the medication.  Please advise.

## 2024-08-25 NOTE — Telephone Encounter (Signed)
 I advised pt phentermine  is the generic of Qsymia , pt states pharmacy told her they did not receive rx. [facility]

## 2024-11-19 ENCOUNTER — Other Ambulatory Visit (HOSPITAL_COMMUNITY): Payer: Self-pay

## 2024-11-19 ENCOUNTER — Encounter: Payer: Self-pay | Admitting: Family Medicine

## 2024-11-19 ENCOUNTER — Telehealth: Payer: Self-pay | Admitting: Pharmacy Technician

## 2024-11-19 ENCOUNTER — Ambulatory Visit (INDEPENDENT_AMBULATORY_CARE_PROVIDER_SITE_OTHER): Admitting: Family Medicine

## 2024-11-19 VITALS — BP 130/78 | HR 78 | Resp 16 | Ht 63.0 in | Wt 194.3 lb

## 2024-11-19 DIAGNOSIS — E66811 Obesity, class 1: Secondary | ICD-10-CM

## 2024-11-19 DIAGNOSIS — N951 Menopausal and female climacteric states: Secondary | ICD-10-CM

## 2024-11-19 DIAGNOSIS — J4540 Moderate persistent asthma, uncomplicated: Secondary | ICD-10-CM | POA: Diagnosis not present

## 2024-11-19 DIAGNOSIS — E538 Deficiency of other specified B group vitamins: Secondary | ICD-10-CM

## 2024-11-19 DIAGNOSIS — G8929 Other chronic pain: Secondary | ICD-10-CM

## 2024-11-19 DIAGNOSIS — G935 Compression of brain: Secondary | ICD-10-CM

## 2024-11-19 DIAGNOSIS — J3089 Other allergic rhinitis: Secondary | ICD-10-CM | POA: Diagnosis not present

## 2024-11-19 DIAGNOSIS — E785 Hyperlipidemia, unspecified: Secondary | ICD-10-CM

## 2024-11-19 DIAGNOSIS — M5442 Lumbago with sciatica, left side: Secondary | ICD-10-CM

## 2024-11-19 DIAGNOSIS — E559 Vitamin D deficiency, unspecified: Secondary | ICD-10-CM | POA: Diagnosis not present

## 2024-11-19 MED ORDER — LEVOCETIRIZINE DIHYDROCHLORIDE 5 MG PO TABS: 5.0000 mg | ORAL_TABLET | Freq: Every evening | ORAL | 1 refills | Status: AC

## 2024-11-19 MED ORDER — CYCLOBENZAPRINE HCL 5 MG PO TABS: 5.0000 mg | ORAL_TABLET | Freq: Three times a day (TID) | ORAL | 0 refills | Status: AC | PRN

## 2024-11-19 MED ORDER — ROSUVASTATIN CALCIUM 10 MG PO TABS: 10.0000 mg | ORAL_TABLET | Freq: Every day | ORAL | 1 refills | Status: AC

## 2024-11-19 MED ORDER — LEVALBUTEROL TARTRATE 45 MCG/ACT IN AERO: 2.0000 | INHALATION_SPRAY | Freq: Four times a day (QID) | RESPIRATORY_TRACT | 0 refills | Status: AC

## 2024-11-19 MED ORDER — B-12 1000 MCG SL SUBL: 1.0000 | SUBLINGUAL_TABLET | Freq: Every day | SUBLINGUAL | 1 refills | Status: AC

## 2024-11-19 MED ORDER — PHENTERMINE-TOPIRAMATE ER 3.75-23 MG PO CP24: 1.0000 | ORAL_CAPSULE | ORAL | 0 refills | Status: AC

## 2024-11-19 MED ORDER — VITAMIN D (ERGOCALCIFEROL) 1.25 MG (50000 UNIT) PO CAPS: 50000.0000 [IU] | ORAL_CAPSULE | ORAL | 0 refills | Status: AC

## 2024-11-19 MED ORDER — FLUTICASONE-SALMETEROL 250-50 MCG/ACT IN AEPB: 1.0000 | INHALATION_SPRAY | Freq: Two times a day (BID) | RESPIRATORY_TRACT | 1 refills | Status: AC

## 2024-11-19 NOTE — Telephone Encounter (Signed)
 Pharmacy Patient Advocate Encounter  Received notification from [facility] that Prior Authorization for Phentermine -Topiramate  ER 3.75-23MG   has been APPROVED from 11/19/24 to 03/19/25. Ran test claim, Copay is $15.00. This test claim was processed through [facility] copay amounts may vary at other pharmacies due to pharmacy/plan contracts, or as the patient moves through the different stages of their insurance plan.   PA #/Case ID/Reference #: [id number]

## 2024-11-19 NOTE — Progress Notes (Signed)
 Name: Margueritte Guthridge   MRN: [id number]    DOB: Sep 29, 1966   Date:11/19/2024       Progress Note  Subjective  Chief Complaint  Chief Complaint  Patient presents with   Medical Management of Chronic Issues   Discussed the use of AI scribe software for clinical note transcription with the patient, who gave verbal consent to proceed.  History of Present Illness The patient is a 59 year old with dyslipidemia and asthma who presents for a follow-up visit.  She underwent an EKG as it had been over ten years since her last one in 2013. The EKG was performed to establish a baseline due to her history of dyslipidemia. The previous EKG had a lot of artifact. EKG today normal sinus rhythm  She has dyslipidemia and was out of her cholesterol medication for about a week prior to her last visit in October. Despite this, her LDL was 118, which was better than her previous baseline of 187. She is currently taking rosuvastatin  10 mg without any issues.  She has moderate persistent asthma and experiences wheezing and coughing when not on medication. She takes Wyxella 250/50 twice a day and has a rescue inhaler, which she rarely uses. She uses albuterol  as her rescue inhaler. No current wheezing, coughing, or shortness of breath while on asthma medication.  She has a history of Chiari malformation and experiences normal back pain. She works out daily, which helps with muscle mass and strength. Recently, she experienced an episode where she couldn't put weight on her right leg due to back pain, which she managed with Tylenol and stretching.  She is trying to manage her weight and has been using a program called 'Simple' to track her food intake and exercise. Despite her efforts, she is not losing weight but continues to focus on her health and well-being.  She experiences sleep disturbances due to menopause, waking up around 2-3 AM with night sweats. She manages this by wearing light clothing to bed.  She is  taking vitamin D  and B12 supplements, as her B12 was previously low. She also takes Celebrex  as needed for back pain and has some Flexeril  left from a previous prescription.    Patient Active Problem List   Diagnosis Date Noted   Perennial allergic rhinitis 08/14/2024   Insomnia associated with menopause 08/14/2024   Meniere disease, left 06/01/2023   Bilateral anterior knee pain 08/10/2022   Insulin  resistance 08/10/2022   Chronic left-sided low back pain with left-sided sciatica 08/10/2022   Menopause syndrome 08/10/2022   Dyslipidemia 08/10/2022   Chiari I malformation (HCC) 08/10/2022   Strain of extensor muscle, fascia and tendon of left index finger at wrist and hand level, initial encounter 08/03/2020   Ganglion cyst 08/03/2020   Trochanteric bursitis of right hip 07/27/2020   History of COVID-19 09/13/2019   Plantar fasciitis of left foot 09/30/2018   Bulging lumbar disc 05/14/2018   Fasting hyperglycemia 04/18/2018   Vitamin D  deficiency 04/18/2018   B12 deficiency 04/18/2018   Obesity (BMI 30.0-34.9) 05/10/2017   Asthma, moderate 07/30/2015   Bilateral cataracts 07/30/2015   Neuralgia neuritis, sciatic nerve 07/30/2015   Recurrent UTI 07/30/2015    Past Surgical History:  Procedure Laterality Date   ABDOMINAL HYSTERECTOMY     BLADDER SUSPENSION  2007   CESAREAN SECTION  2001   COLONOSCOPY WITH PROPOFOL  N/A 08/13/2017   Procedure: COLONOSCOPY WITH PROPOFOL ;  Surgeon: Therisa Bi, MD;  Location: [facility] ENDOSCOPY;  Service: Gastroenterology;  Laterality: N/A;  COLONOSCOPY WITH PROPOFOL  N/A 12/21/2022   Procedure: COLONOSCOPY WITH PROPOFOL ;  Surgeon: Onita Elspeth Sharper, DO;  Location: [facility] ENDOSCOPY;  Service: Gastroenterology;  Laterality: N/A;   DE QUERVAIN'S RELEASE Left 11/24/2020   renal abscess  1994    Family History  Problem Relation Age of Onset   Heart disease Father    Asthma Father    Diverticulosis Father    Cancer Sister        Thyroid    Anxiety  disorder Daughter    OCD Daughter    Hyperlipidemia Maternal Grandmother    Diverticulosis Maternal Grandmother    Diabetes Maternal Grandfather    Asthma Paternal Grandmother    Hyperlipidemia Paternal Grandmother    Seizures Son    Breast cancer Cousin 32       pat 1st     Social History   Tobacco Use   Smoking status: Never   Smokeless tobacco: Never  Substance Use Topics   Alcohol use: Yes    Alcohol/week: 0.0 standard drinks of alcohol    Comment: occasionally    Current Medications[1]  Allergies[2]  I personally reviewed active problem list, medication list, allergies, family history with the patient/caregiver today.   ROS  Ten systems reviewed and is negative except as mentioned in HPI    Objective Physical Exam CONSTITUTIONAL: Patient appears well-developed and well-nourished.  No distress. HEENT: Head atraumatic, normocephalic, neck supple. CARDIOVASCULAR: Normal rate, regular rhythm and normal heart sounds.  No murmur heard. No BLE edema. PULMONARY: Effort normal and breath sounds normal. No respiratory distress. ABDOMINAL: There is no tenderness or distention. MUSCULOSKELETAL: Normal gait. Without gross motor or sensory deficit. PSYCHIATRIC: Patient has a normal mood and affect. behavior is normal. Judgment and thought content normal.  Vitals:   11/19/24 0849  BP: 130/78  Pulse: 78  Resp: 16  SpO2: 97%  Weight: 194 lb 4.8 oz (88.1 kg)  Height: 5' 3 (1.6 m)    Body mass index is 34.42 kg/m.    PHQ2/9:    11/19/2024    8:46 AM 08/14/2024    9:12 AM 09/28/2023   12:06 PM 09/06/2023    8:17 AM 06/01/2023    9:20 AM  Depression screen PHQ 2/9  Decreased Interest 0 0 0 0 0  Down, Depressed, Hopeless 0 0 0 0 0  PHQ - 2 Score 0 0 0 0 0  Altered sleeping    3 3  Tired, decreased energy    0 0  Change in appetite    0 0  Feeling bad or failure about yourself     0 0  Trouble concentrating    0 0  Moving slowly or fidgety/restless    0 0   Suicidal thoughts    0 0  PHQ-9 Score    3  3   Difficult doing work/chores    Not difficult at all Not difficult at all     Data saved with a previous flowsheet row definition    phq 9 is negative  Fall Risk:    11/19/2024    8:46 AM 08/14/2024    9:12 AM 09/28/2023   12:06 PM 09/06/2023    8:17 AM 06/01/2023    9:20 AM  Fall Risk   Falls in the past year? 0 0 0 0 0  Number falls in past yr: 0 0 0    Injury with Fall? 0 0      Risk for fall due to : No Fall Risks  No Fall Risks No Fall Risks No Fall Risks No Fall Risks  Follow up Falls evaluation completed Falls evaluation completed Falls prevention discussed Falls prevention discussed Falls prevention discussed     Data saved with a previous flowsheet row definition     Assessment & Plan Obesity Weight stable with slight decrease. Engaged in lifestyle modifications. Phentermine -topiramate  not filled due to stock issues. Discussed alternative medications, cost prohibitive. - Sent phentermine -topiramate  prescription to pharmacy, instructed to check availability. - Encouraged continuation of lifestyle modifications and mindfulness practices.  Dyslipidemia LDL improved to 118, suggesting familial hyperlipidemia. Rosuvastatin  10 mg well-tolerated. - Continue rosuvastatin  10 mg daily. - Refilled rosuvastatin  prescription for six months.  Moderate persistent asthma Asthma well-controlled with current regimen. Rare use of rescue inhaler. - Refilled Vaxelis prescription. - Prescribed albuterol  as rescue inhaler.  Chiari I malformation Normal back pain managed with exercise and stretching. - Continue daily exercise and stretching regimen. - Use Tylenol as needed for pain management.  Chronic left-sided low back pain with sciatica Managed with Celebrex  and Tylenol as needed. - Prescribed Celebrex  100 mg up to twice daily for flares. - Continue Tylenol as needed for pain management.  Vitamin B12 deficiency Previously  completed supplementation course. Levels low, supplementation necessary. - Continue B12 supplementation daily.  Vitamin D  deficiency Completed prescription course, requires continuation. - Prescribed vitamin D  once weekly until next blood work.  Insomnia associated with menopause Night sweats and early waking consistent with menopause-related insomnia. - Advised on sleep hygiene, including using a light nightgown and managing room temperature.  General Health Maintenance Received flu and pneumonia vaccinations. Discussed importance of regular exams. - Will schedule physical exam when due. - Ensure vaccinations are up to date.        [1]  Current Outpatient Medications:    celecoxib  (CELEBREX ) 100 MG capsule, TAKE 1 CAPSULE BY MOUTH TWICE A DAY (Patient taking differently: Take 100 mg by mouth as needed for moderate pain (pain score 4-6).), Disp: 180 capsule, Rfl: 0   cyclobenzaprine  (FLEXERIL ) 5 MG tablet, Take 1 tablet (5 mg total) by mouth 3 (three) times daily as needed., Disp: 30 tablet, Rfl: 0   estradiol (ESTRACE) 0.1 MG/GM vaginal cream, Place 1 Applicatorful vaginally daily., Disp: , Rfl:    fluticasone -salmeterol (WIXELA INHUB ) 250-50 MCG/ACT AEPB, Inhale 1 puff into the lungs in the morning and at bedtime., Disp: 180 each, Rfl: 1   levalbuterol  (XOPENEX  HFA) 45 MCG/ACT inhaler, Inhale 2 puffs into the lungs 4 (four) times daily., Disp: 45 g, Rfl: 0   levocetirizine (XYZAL ) 5 MG tablet, Take 1 tablet (5 mg total) by mouth every evening., Disp: 90 tablet, Rfl: 1   Magnesium  Glycinate 100 MG CAPS, Take 1 capsule by mouth at bedtime as needed., Disp: 100 capsule, Rfl: 0   Phentermine -Topiramate  ER 3.75-23 MG CP24, Take 1 capsule by mouth every morning., Disp: 30 capsule, Rfl: 0   Phentermine -Topiramate  ER 7.5-46 MG CP24, Take 1 capsule by mouth every morning., Disp: 30 capsule, Rfl: 1   rosuvastatin  (CRESTOR ) 10 MG tablet, Take 1 tablet (10 mg total) by mouth daily., Disp: 90  tablet, Rfl: 1   Vitamin D , Ergocalciferol , (DRISDOL ) 1.25 MG (50000 UNIT) CAPS capsule, Take 1 capsule (50,000 Units total) by mouth every 7 (seven) days., Disp: 12 capsule, Rfl: 0   Cholecalciferol (VITAMIN D ) 50 MCG (2000 UT) CAPS, Take 2,000 Units by mouth daily. (Patient not taking: Reported on 11/19/2024), Disp: , Rfl:    Cyanocobalamin  (B-12) 1000 MCG SUBL, Place  1 tablet under the tongue daily at 12 noon. (Patient not taking: Reported on 11/19/2024), Disp: , Rfl:    meclizine  (ANTIVERT ) 12.5 MG tablet, Take 1 tablet (12.5 mg total) by mouth 3 (three) times daily as needed for dizziness. (Patient not taking: Reported on 11/19/2024), Disp: 30 tablet, Rfl: 0 [2]  Allergies Allergen Reactions   Nitrofurantoin Anaphylaxis   Amoxicillin     rash   Gabapentin    Molds & Smuts    Montelukast Sodium     tachycardia   Nitrofurantoin Monohyd Macro Diarrhea, Itching and Nausea Only    Other reaction(s): Abdominal pain, Dizziness, Unconsciousness   Penicillin V Potassium Other (See Comments)   Pollen Extract

## 2024-11-19 NOTE — Telephone Encounter (Signed)
 Pharmacy Patient Advocate Encounter   Received notification from CoverMyMeds that prior authorization for Phentermine -Topiramate  ER 3.75-23MG  er capsules is required/requested.   Insurance verification completed.   The patient is insured through [facility].   Per test claim: PA required; PA started via CoverMyMeds. KEY [id number] . Waiting for clinical questions to populate.

## 2025-02-19 ENCOUNTER — Encounter: Admitting: Family Medicine

## 2025-05-20 ENCOUNTER — Ambulatory Visit: Admitting: Family Medicine
